# Patient Record
Sex: Male | Born: 1955 | Race: White | Hispanic: No | Marital: Married | Smoking: Former smoker
Health system: Southern US, Community
[De-identification: ages and names within clinical notes are randomized; demographics above are authoritative.]

## PROBLEM LIST (undated history)

## (undated) DIAGNOSIS — E669 Obesity, unspecified: Secondary | ICD-10-CM

## (undated) DIAGNOSIS — G4730 Sleep apnea, unspecified: Secondary | ICD-10-CM

## (undated) DIAGNOSIS — R7989 Other specified abnormal findings of blood chemistry: Secondary | ICD-10-CM

## (undated) DIAGNOSIS — E66811 Obesity, class 1: Secondary | ICD-10-CM

## (undated) DIAGNOSIS — G4733 Obstructive sleep apnea (adult) (pediatric): Secondary | ICD-10-CM

## (undated) DIAGNOSIS — I10 Essential (primary) hypertension: Secondary | ICD-10-CM

## (undated) DIAGNOSIS — K219 Gastro-esophageal reflux disease without esophagitis: Secondary | ICD-10-CM

## (undated) DIAGNOSIS — R7303 Prediabetes: Secondary | ICD-10-CM

## (undated) DIAGNOSIS — M1712 Unilateral primary osteoarthritis, left knee: Secondary | ICD-10-CM

## (undated) DIAGNOSIS — F32A Depression, unspecified: Secondary | ICD-10-CM

## (undated) DIAGNOSIS — C801 Malignant (primary) neoplasm, unspecified: Secondary | ICD-10-CM

## (undated) DIAGNOSIS — Z9989 Dependence on other enabling machines and devices: Secondary | ICD-10-CM

## (undated) HISTORY — DX: Essential (primary) hypertension: I10

## (undated) HISTORY — PX: PROSTATE SURGERY: SHX751

## (undated) HISTORY — DX: Other specified abnormal findings of blood chemistry: R79.89

## (undated) HISTORY — DX: Malignant (primary) neoplasm, unspecified: C80.1

## (undated) HISTORY — PX: TONSILLECTOMY: SUR1361

## (undated) HISTORY — PX: REPLACEMENT TOTAL KNEE: SUR1224

## (undated) HISTORY — DX: Sleep apnea, unspecified: G47.30

## (undated) HISTORY — PX: JOINT REPLACEMENT: SHX530

---

## 2005-06-06 HISTORY — PX: PROSTATECTOMY: SHX69

## 2007-01-24 ENCOUNTER — Ambulatory Visit: Payer: Self-pay | Admitting: Gastroenterology

## 2007-10-23 ENCOUNTER — Ambulatory Visit: Payer: Self-pay | Admitting: Family Medicine

## 2010-03-17 IMAGING — CR DG KNEE COMPLETE 4+V*R*
1 series · 4 of 4 positions shown · non-contrast
Comparison: none

REASON FOR EXAM: pain
COMMENTS:

[Series 1: view not recorded · 0.17mm/px · 4 of 4 slices shown]
[im 1/4]
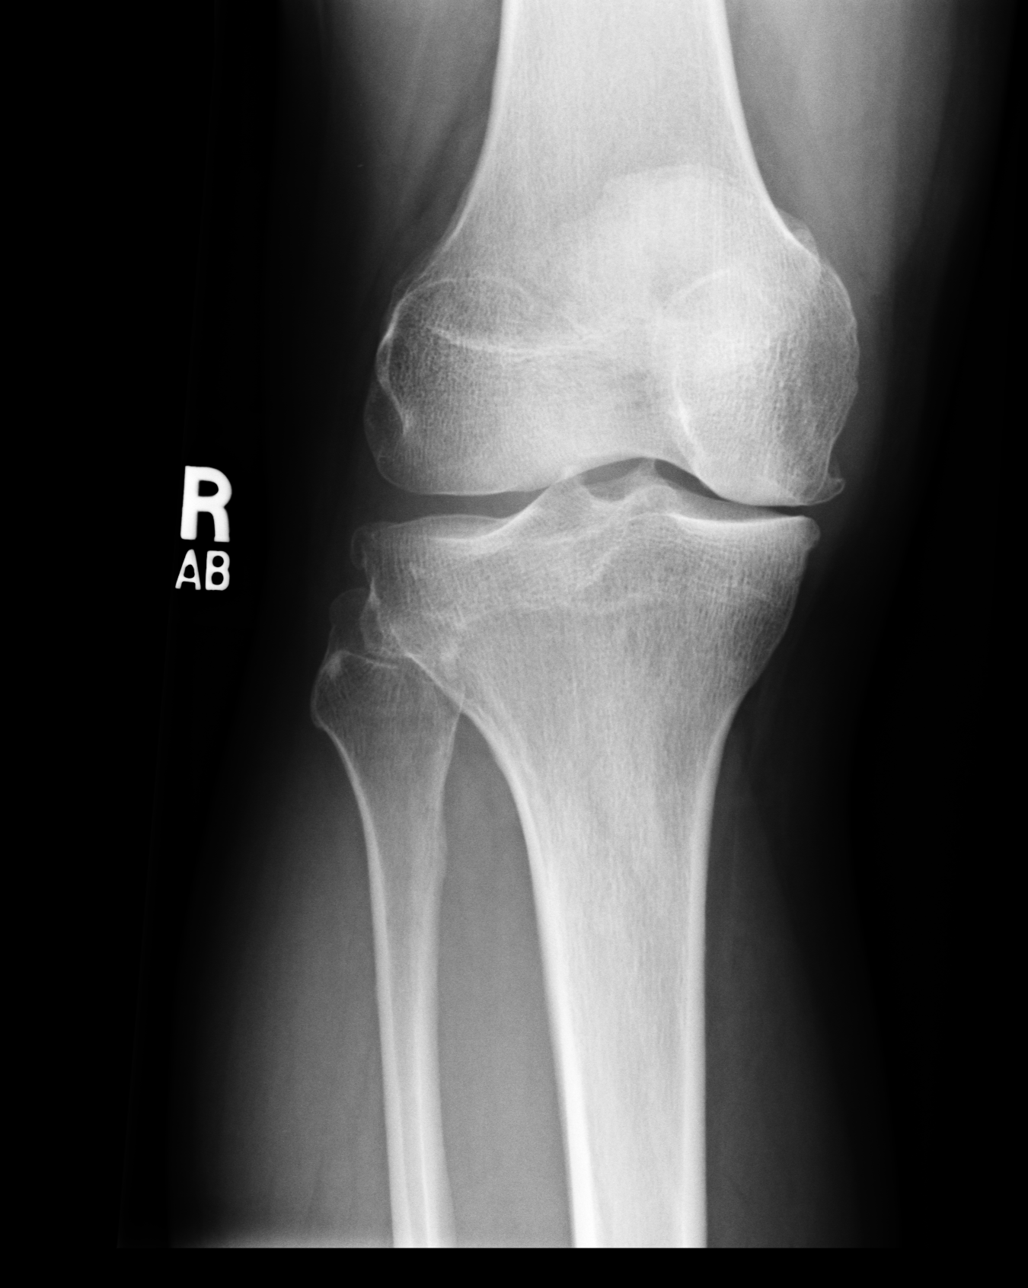
[im 2/4]
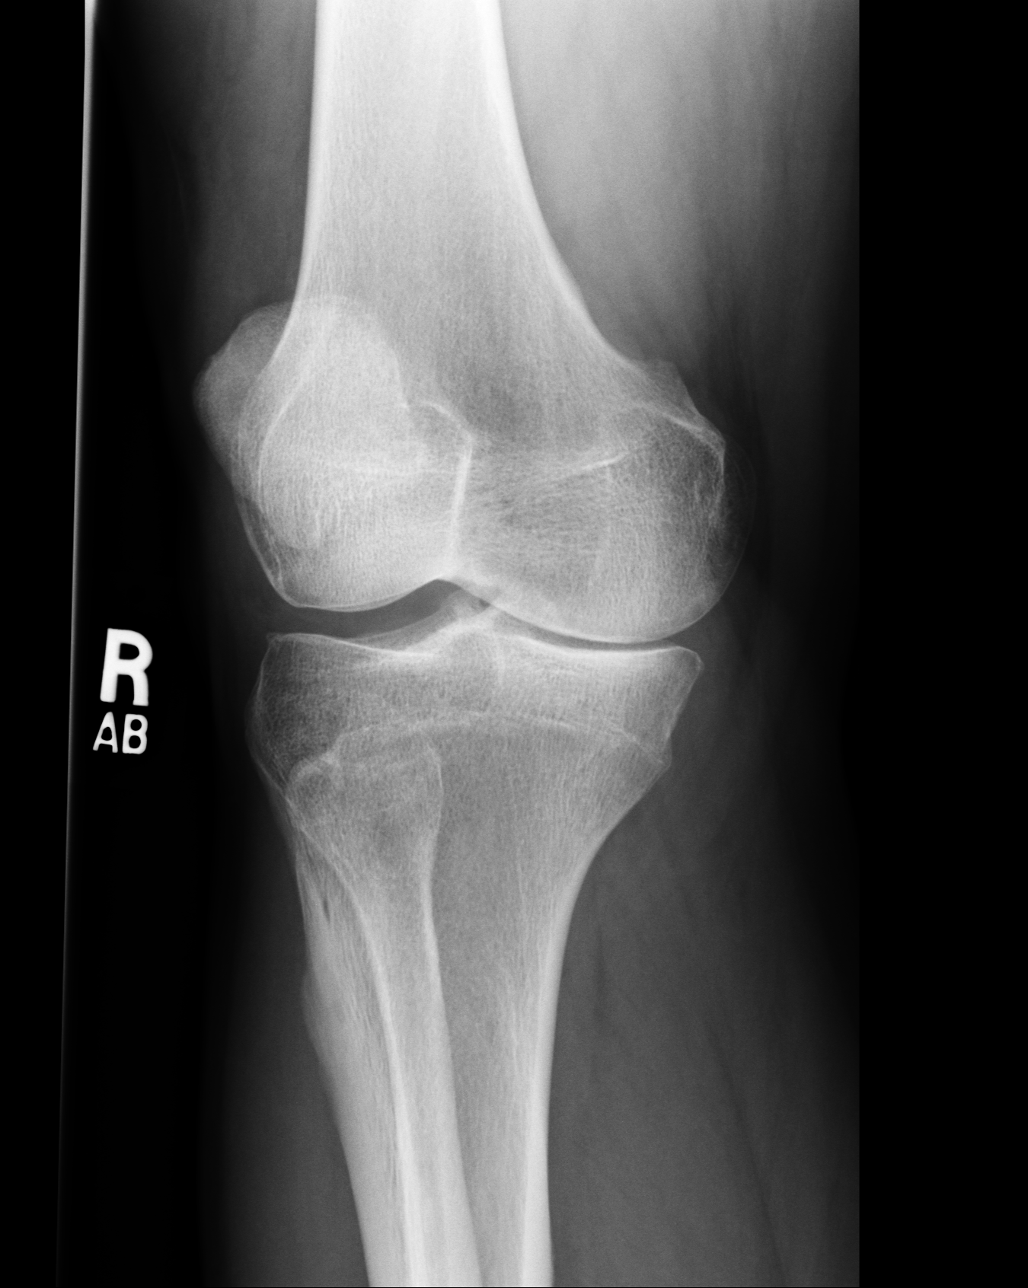
[im 3/4]
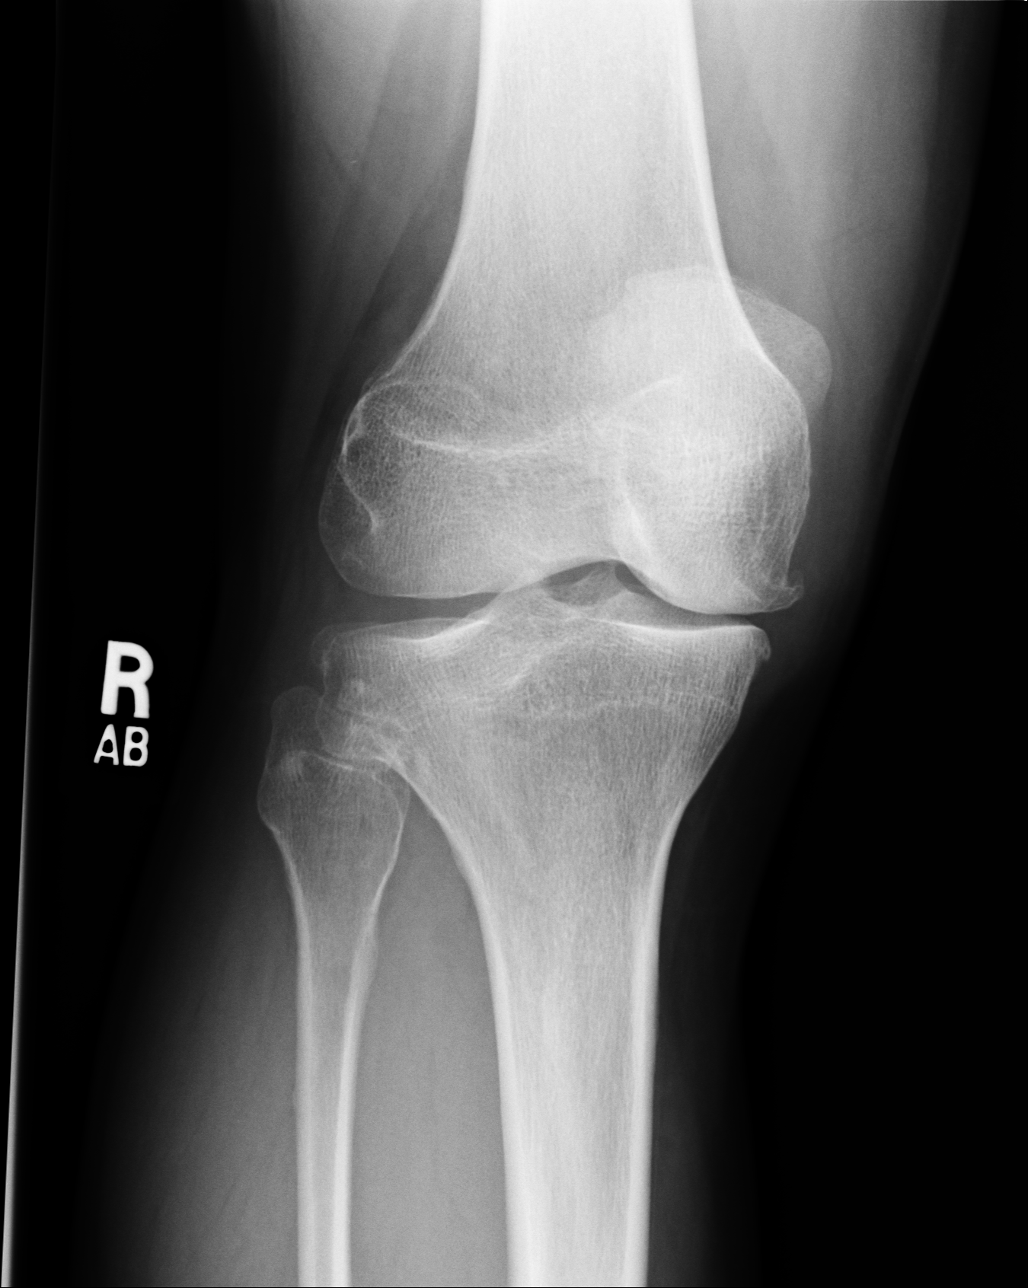
[im 4/4]
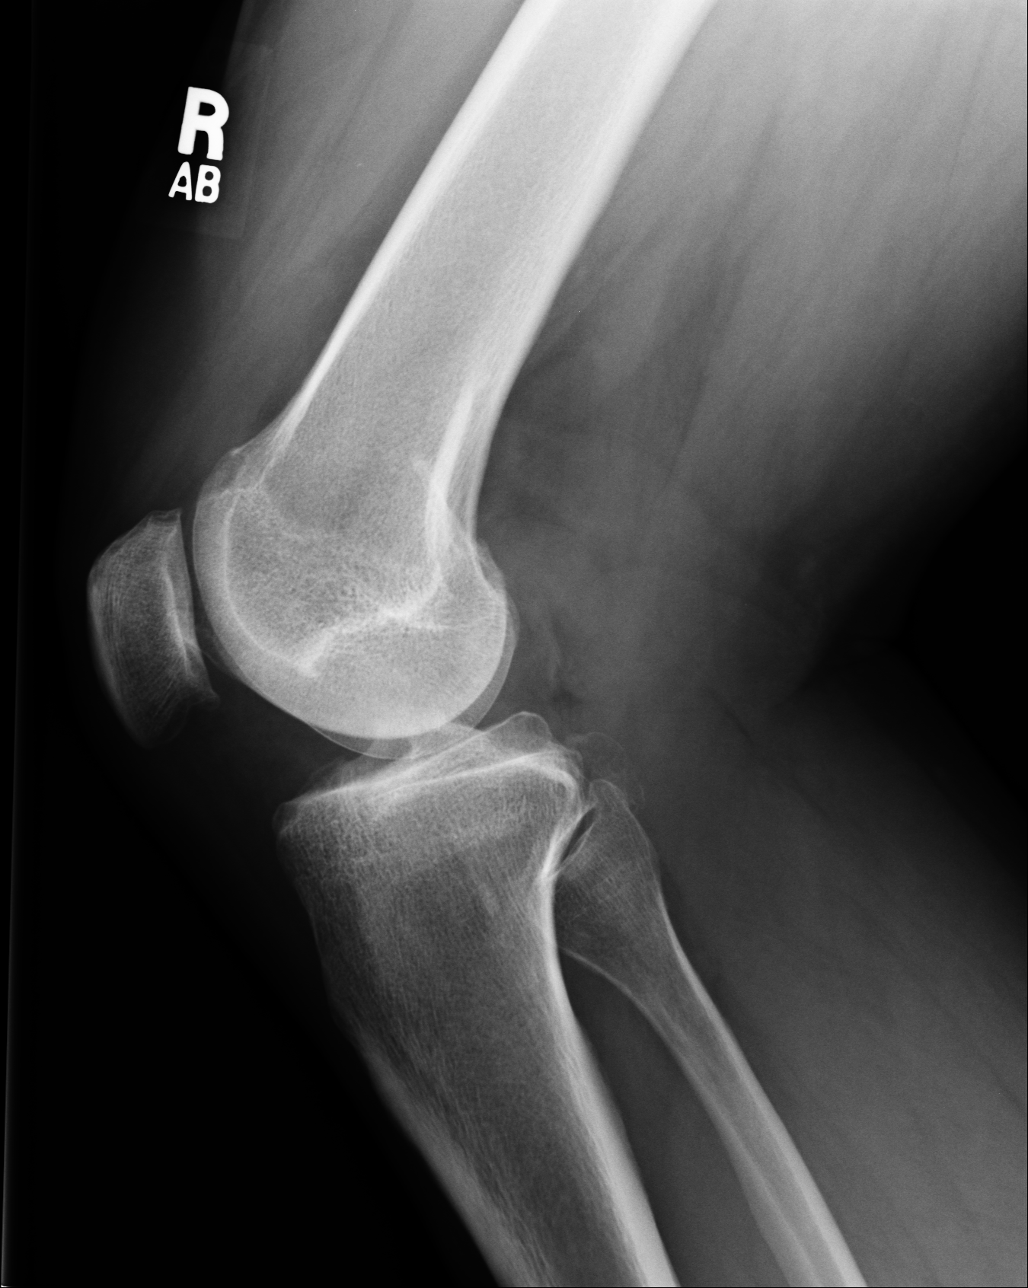

[4 of 4 positions shown; findings below may reference images not displayed]

PROCEDURE:     DXR - DXR KNEE RT COMP WITH OBLIQUES  - October 23, 2007 [DATE]

RESULT:     Images of the RIGHT knee demonstrate joint space narrowing
medially with hypertrophic endplate spurring and some subchondral sclerosis.
There is patellofemoral compartmental narrowing with some mild hypertrophic
marginal spurring. There is no fracture or dislocation. There is no foreign
body.
IMPRESSION: Moderately severe degenerative changes. MRI can be obtained if there is
concern for internal derangement.

## 2010-03-25 ENCOUNTER — Ambulatory Visit: Payer: Self-pay | Admitting: Gastroenterology

## 2013-01-21 ENCOUNTER — Ambulatory Visit: Payer: Self-pay | Admitting: General Practice

## 2013-01-21 LAB — BASIC METABOLIC PANEL
BUN: 18 mg/dL (ref 7–18)
Calcium, Total: 9.2 mg/dL (ref 8.5–10.1)
Chloride: 105 mmol/L (ref 98–107)
Co2: 30 mmol/L (ref 21–32)
Creatinine: 1.07 mg/dL (ref 0.60–1.30)
EGFR (African American): >60
EGFR (Non-African Amer.): >60
Osmolality: 276 (ref 275–301)
Potassium: 4.1 mmol/L (ref 3.5–5.1)
Sodium: 137 mmol/L (ref 136–145)

## 2013-01-21 LAB — CBC
MCV: 86 fL (ref 80–100)
Platelet: 249 10*3/uL (ref 150–440)
RBC: 5.4 10*6/uL (ref 4.40–5.90)
WBC: 7.2 10*3/uL (ref 3.8–10.6)

## 2013-01-21 LAB — URINALYSIS, COMPLETE
Blood: NEGATIVE
Leukocyte Esterase: NEGATIVE
Ph: 5 (ref 4.5–8.0)
RBC,UR: 1 /HPF (ref 0–5)
Specific Gravity: 1.026 (ref 1.003–1.030)
WBC UR: <1 /HPF (ref 0–5)

## 2013-01-21 LAB — MRSA PCR SCREENING

## 2013-01-21 LAB — PROTIME-INR
INR: 1
Prothrombin Time: 13.4 secs (ref 11.5–14.7)

## 2013-01-22 LAB — URINE CULTURE

## 2013-02-06 ENCOUNTER — Inpatient Hospital Stay: Payer: Self-pay | Admitting: General Practice

## 2013-02-06 HISTORY — PX: REPLACEMENT TOTAL KNEE: SUR1224

## 2013-02-07 LAB — BASIC METABOLIC PANEL
Co2: 28 mmol/L (ref 21–32)
Creatinine: 1.12 mg/dL (ref 0.60–1.30)
EGFR (Non-African Amer.): >60
Glucose: 106 mg/dL — ABNORMAL HIGH (ref 65–99)
Osmolality: 274 (ref 275–301)
Sodium: 137 mmol/L (ref 136–145)

## 2013-02-07 LAB — PLATELET COUNT: Platelet: 205 10*3/uL (ref 150–440)

## 2013-02-08 LAB — BASIC METABOLIC PANEL
Anion Gap: 6 — ABNORMAL LOW (ref 7–16)
Calcium, Total: 8.5 mg/dL (ref 8.5–10.1)
Co2: 29 mmol/L (ref 21–32)
Creatinine: 0.95 mg/dL (ref 0.60–1.30)
EGFR (Non-African Amer.): >60
Glucose: 108 mg/dL — ABNORMAL HIGH (ref 65–99)
Potassium: 4 mmol/L (ref 3.5–5.1)
Sodium: 138 mmol/L (ref 136–145)

## 2013-02-08 LAB — PLATELET COUNT: Platelet: 196 10*3/uL (ref 150–440)

## 2014-09-26 NOTE — Op Note (Signed)
PATIENT NAMEBELEN, [name] MR#:  [id number] DATE OF BIRTH:  1955/07/28  DATE OF PROCEDURE:  02/06/2013  PREOPERATIVE DIAGNOSIS: Degenerative arthrosis of the right knee.   POSTOPERATIVE DIAGNOSIS: Degenerative arthrosis of the right knee.   PROCEDURE PERFORMED: Right total knee arthroplasty using computer-assisted navigation.   SURGEON: Laurice Record. Holley Bouche., MD   ASSISTANT:  Vance Peper, PA (required to maintain retraction throughout the procedure).  ANESTHESIA: Spinal.   ESTIMATED BLOOD LOSS: 100 mL.   FLUIDS REPLACED: 3000 mL of crystalloid.   TOURNIQUET TIME: 102 minutes.   DRAINS: Two medium drains to reinfusion system.   SOFT TISSUE RELEASES: ACL, PCL, deep medial collateral ligament and patellofemoral ligament.   IMPLANTS UTILIZED: DePuy PFC Sigma size 5 posterior-stabilized femoral component (cemented), size 5 MBT tibial component (cemented), 38 mm 3-peg oval dome patella (cemented) and a 10 mm stabilized rotating platform polyethylene insert.   INDICATIONS FOR SURGERY: The patient is a 59 year old gentleman who has been seen for complaints of progressive right knee pain. X-rays demonstrated severe degenerative changes in tricompartmental fashion with relative varus alignment. After discussion of the risks and benefits of surgical intervention, the patient expressed understanding of the risks and benefits and agreed with plans for surgical intervention.   PROCEDURE IN DETAIL: The patient was brought into the Operating Room and after adequate spinal anesthesia was achieved, a tourniquet was placed on the patient's upper right thigh. The patient's right knee and leg were cleaned and prepped with alcohol and DuraPrep, draped in the usual sterile fashion. A "timeout" was performed as per usual protocol. The right lower extremity was exsanguinated using an Esmarch and the tourniquet was inflated to 300 mmHg.   An anterior longitudinal incision was made followed by a standard mid  vastus approach. A moderate effusion was evacuated. Inspection of the knee demonstrated severe degenerative changes in a tricompartmental fashion with full-thickness loss of articular cartilage to the medial compartment. Also of note were areas of extensive synovitis. Synovectomy was performed. Prominent osteophytes were debrided using a rongeur. Anterior and posterior cruciate ligaments were excised. Two 4.0 mm Schanz pins were inserted into the femur and into the tibia for attachment of the array of trackers used for computer-assisted navigation. Hip center was identified using circumduction technique. Distal landmarks were mapped using the computer. The distal femur and proximal tibia were mapped using the computer. Distal femoral cutting was positioned using computer-assisted navigation so as to achieve a 5 degree distal valgus cut. Cut was performed and verified using the computer. The distal femur was sized and it was felt that a size 5 femur was appropriate. A size 5 cutting guide was positioned and the anterior cut was performed and verified using the computer. This was followed by completion of the posterior and chamfer cuts. Femoral cutting guide for central box was then positioned and the central box cut was performed.   Attention was then directed to the proximal tibia. Medial and lateral menisci were excised. The extramedullary tibial cutting guide was positioned using computer-assisted navigation so as to achieve 0 degrees varus valgus alignment and 0 degrees posterior slope. Cut was performed and verified using the computer. The proximal tibia was sized and it was felt that a size 5 tibial tray was appropriate. Tibial and femoral trials were inserted followed by insertion of a 10 mm polyethylene trial. Good medial and lateral soft tissue balancing was appreciated both in extension and in flexion. Finally, the patella was cut and prepared so as to accommodate a  38 mm 3-peg oval dome patella. Patellar  trial was placed and the knee was placed through a range of motion with excellent patellar tracking appreciated.   Femoral trial was removed after debridement of posterior osteophytes. The central post hole for the tibial component was reamed followed by insertion of a keel punch. Tibial trial was then removed. The cut surfaces of bone were irrigated with copious amounts of normal saline with antibiotic solution using pulsatile lavage and then suctioned dry. Polymethyl methacrylate cement was prepared in the usual fashion using a vacuum mixer. Cement was applied to the cut surfaces of the proximal tibia as well as along the undersurface of a size 5 MBT tibial component. The tibial component was positioned and impacted into place. Excess cement was removed using [employer] fast streamer. Cement was then applied to the cut surfaces of the femur as well as along the posterior flanges of a size 5 posterior-stabilized femoral component. Femoral component was positioned and impacted into place. A 10 mm polyethylene trial was inserted and the knee was brought into full extension with steady axial compression applied. Finally, cement was applied to the backside of a 38 mm, 3-peg oval dome patella and the patellar component was positioned and patellar clamp applied. Excess cement was removed using [employer] fast streamer.   After adequate curing of the cement, the tourniquet was deflated after a total tourniquet time of 102 minutes. Hemostasis was achieved using electrocautery. The knee was irrigated with copious amounts of normal saline with antibiotic solution using pulsatile lavage and then suctioned dry. The knee was inspected for any residual cement debris. Then 20 mL of 1.3% Exparel in 40 mL of normal saline was injected along the posterior recess as well as the medial and lateral recesses and along the arthrotomy site. A 10 mm stabilized rotating platform polyethylene insert was inserted and the knee was placed through a range of  motion. Excellent mediolateral soft tissue balancing was appreciated both in flexion and in extension. Excellent patellar tracking was appreciated.   Two medium drains were placed in the wound bed and brought out through a separate stab incision to be attached to a reinfusion system. The medial parapatellar portion of the incision was reapproximated using interrupted sutures of #1 Vicryl. The subcutaneous tissue was approximated in layers using first #0 Vicryl followed by #2-0 Vicryl. It should be noted that 30 mL of 0.25% Marcaine with epinephrine was injected along the subcutaneous tissue to either side of the incision site. The skin was then reapproximated using skin staples. Sterile dressing was applied. The patient tolerated the procedure well. He was transported to the recovery room in stable condition.   ____________________________ Laurice Record. Holley Bouche., MD jph:cs D: 02/06/2013 15:16:20 ET T: 02/06/2013 15:52:39 ET JOB#: [id number]  cc: Jeneen Rinks P. Holley Bouche., MD, <Dictator> JAMES P Holley Bouche MD ELECTRONICALLY SIGNED 02/13/2013 7:17

## 2014-09-26 NOTE — Discharge Summary (Signed)
PATIENT NAMEAMEEN, [name] MR#:  [id number] DATE OF BIRTH:  04-Apr-1956  DATE OF ADMISSION:  02/06/2013 DATE OF DISCHARGE:  02/09/2013   DICTATING FOR: Jeneen Rinks P. Holley Bouche., MD  ADMITTING DIAGNOSIS: Degenerative arthrosis of the right knee.   HISTORY: The patient is a pleasant 59 year old gentleman who has been followed at [facility] for progression of right knee pain. He has reported a long history of knee pain. The patient states that the pain had increased to the point that it was significantly interfering with his activities of daily living. The patient also stated that he had noted his pain was noted to be aggravated with weight-bearing activities. He had noted some swelling of the right knee as well. Also reported some near-giving-way. The patient did report that he was experiencing night pain as well. At the time of surgery, he was not using any ambulatory aid. The patient had not seen any significant improvement in his condition despite conservative care of anti-inflammatory medications, activity modification. X-rays taken in [facility] showed narrowing of the medial cartilage space with bone-on-bone articulation noted as well as being in a slight varus alignment. Osteophyte as well as subchondral sclerosis was noted. After discussion of the risks and benefits of surgical intervention, the patient expressed his understanding of the risks and benefits and agreed for plans for surgical intervention.   PROCEDURE: Right total knee arthroplasty using computer-assisted navigation.   ANESTHESIA: Spinal.   SOFT TISSUE RELEASE: Anterior cruciate ligament, posterior cruciate ligament, deep medial collateral ligaments as well as the patellofemoral ligament.   IMPLANTS UTILIZED: DePuy PFC Sigma size 5 posterior stabilized femoral component (cemented), size 5 MBT tibial component (cemented), 38 mm 3-pegged oval dome patella (cemented), and a 10 mm stabilized rotating platform polyethylene  insert.   HOSPITAL COURSE: The patient tolerated the procedure very well. He had no complications. He was then taken to the PACU, where he was stabilized and then transferred to the orthopedic floor. The patient began receiving anticoagulation therapy of Lovenox 30 mg subcutaneous q.12 hours per anesthesia and pharmacy protocol. He was fitted with TED stockings bilaterally. These were allowed to be removed 1 hour per 8-hour shift. The right one was applied on day 2 following removal of the Hemovac and dressing change. The patient was also fitted with AVI compression foot pumps bilaterally set at 80 mmHg. His calves have been nontender. There has been no evidence of any DVTs. Negative Homans sign. Heels were elevated off the bed using rolled towels.   The patient has denied any chest pain or any shortness of breath. Vital signs have been stable. He has been afebrile. Hemodynamically, he was stable. No transfusions were given other than the Autovac transfusion given the first 6 hours postoperatively.   Physical therapy was initiated on day 1 for gait training and transfers. He has done very well. Upon being discharged, he was ambulating greater than 200 feet. He was independent with bed-to-chair transfers. He was able to go up 4 steps. Occupational therapy was also initiated on day 1 for ADLs and assistive devices. He has had no complications.   The patient's IV, Foley and Hemovac were discontinued on day 2 along with a dressing change. The wound was free of any drainage or signs of infection. Polar Care was reapplied to the surgical leg, maintaining a temperature of 40 to 50 degrees Fahrenheit.   DISPOSITION: The patient is being discharged to home in improved stable condition.   DISCHARGE INSTRUCTIONS: 1. May continue to  weight bear as tolerated.  2. Elevate the right leg when sitting.  3. Continue with TED stockings. These are to be removed at night, but are to be worn during the day.  4. Incentive  spirometer q.1 hour while awake.  5. Encourage cough and deep breathing q.2 hours while awake.  6. He is placed on a regular diet.  7. Polar Care to the surgical leg, maintaining a temperature of 40 to 50 degrees Fahrenheit. Recommend that he wear this around the clock for the first 2 weeks.  8. He is not to get the dressing wet. He is not to take a shower until the staples are removed at 2 weeks.  9. He has a followup appointment in [facility] on September 18th at 8:30.  10. He is to call the clinic sooner if any temperatures of 101.5 or greater or excessive bleeding. 11. He is to continue using a walker until cleared by physical therapy to go to a quad cane. 12. He will receive home health PT.   DISCHARGE MEDICATIONS: He is to resume his regular medications that he was on prior to admission. He was given a prescription for oxycodone 5 to 10 mg q.4-6 hours p.r.n. for pain, Ultram 50 to 100 mg q.4-6 hours p.r.n. for pain and Lovenox 40 mg subcutaneously daily for 14 days, then discontinue and begin taking one 81 mg enteric-coated aspirin. He may resume his naproxen and ibuprofen once he has finished the Lovenox.   PAST MEDICAL HISTORY:  1. Hypertension.  2. Prostate cancer diagnosed in 2007.  3. Sleep apnea. 4. Depression.  ____________________________ Vance Peper, PA jrw:OSi D: 02/08/2013 07:51:00 ET T: 02/08/2013 08:45:12 ET JOB#: [id number]  cc: Vance Peper, PA, <Dictator> Monick Rena PA ELECTRONICALLY SIGNED 02/12/2013 8:21

## 2014-11-25 IMAGING — CR DG KNEE 1-2V*R*
1 series · 2 of 2 positions shown · non-contrast
Comparison: none

REASON FOR EXAM: postop
COMMENTS:   Bedside (portable):Y

PROCEDURE:     DXR - DXR KNEE RIGHT AP AND LATERAL  - February 06, 2013 [DATE]
RESULT:     History: Post-op TKR

[Series 1: ap · 0.17mm/px · 2 of 2 slices shown]
[im 1/2]
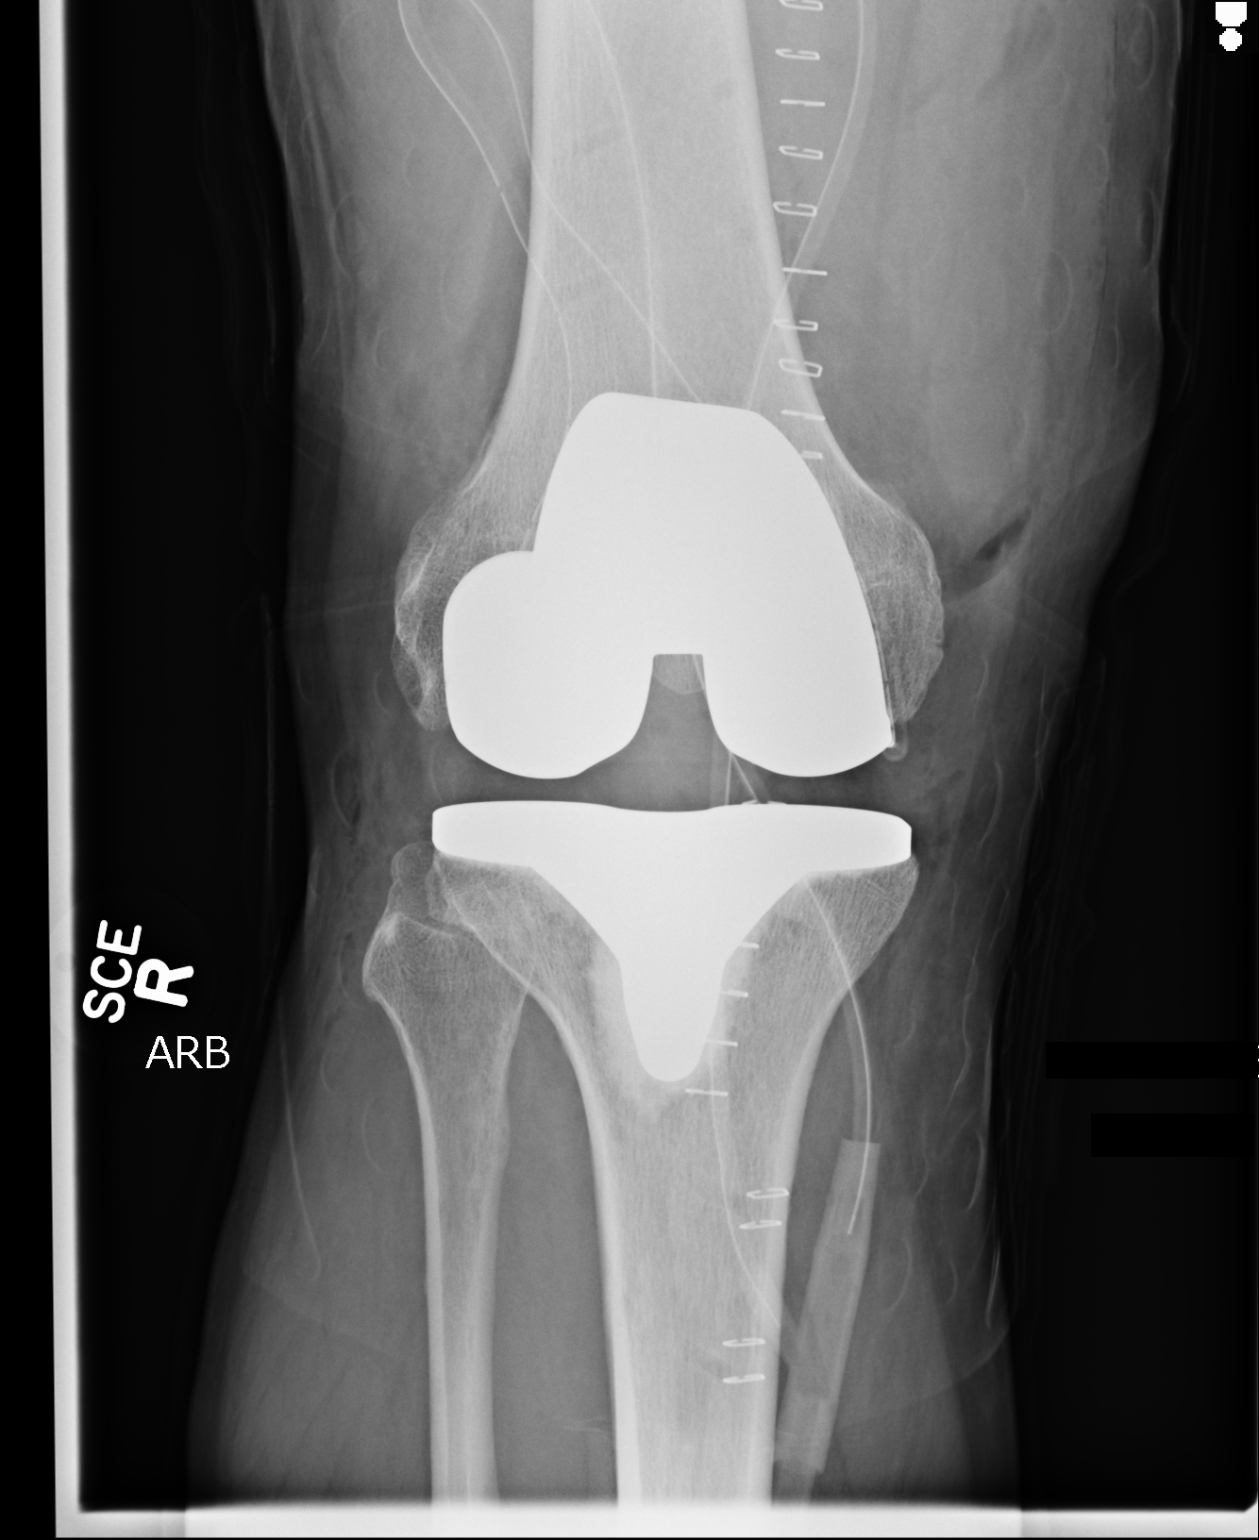
[im 2/2]
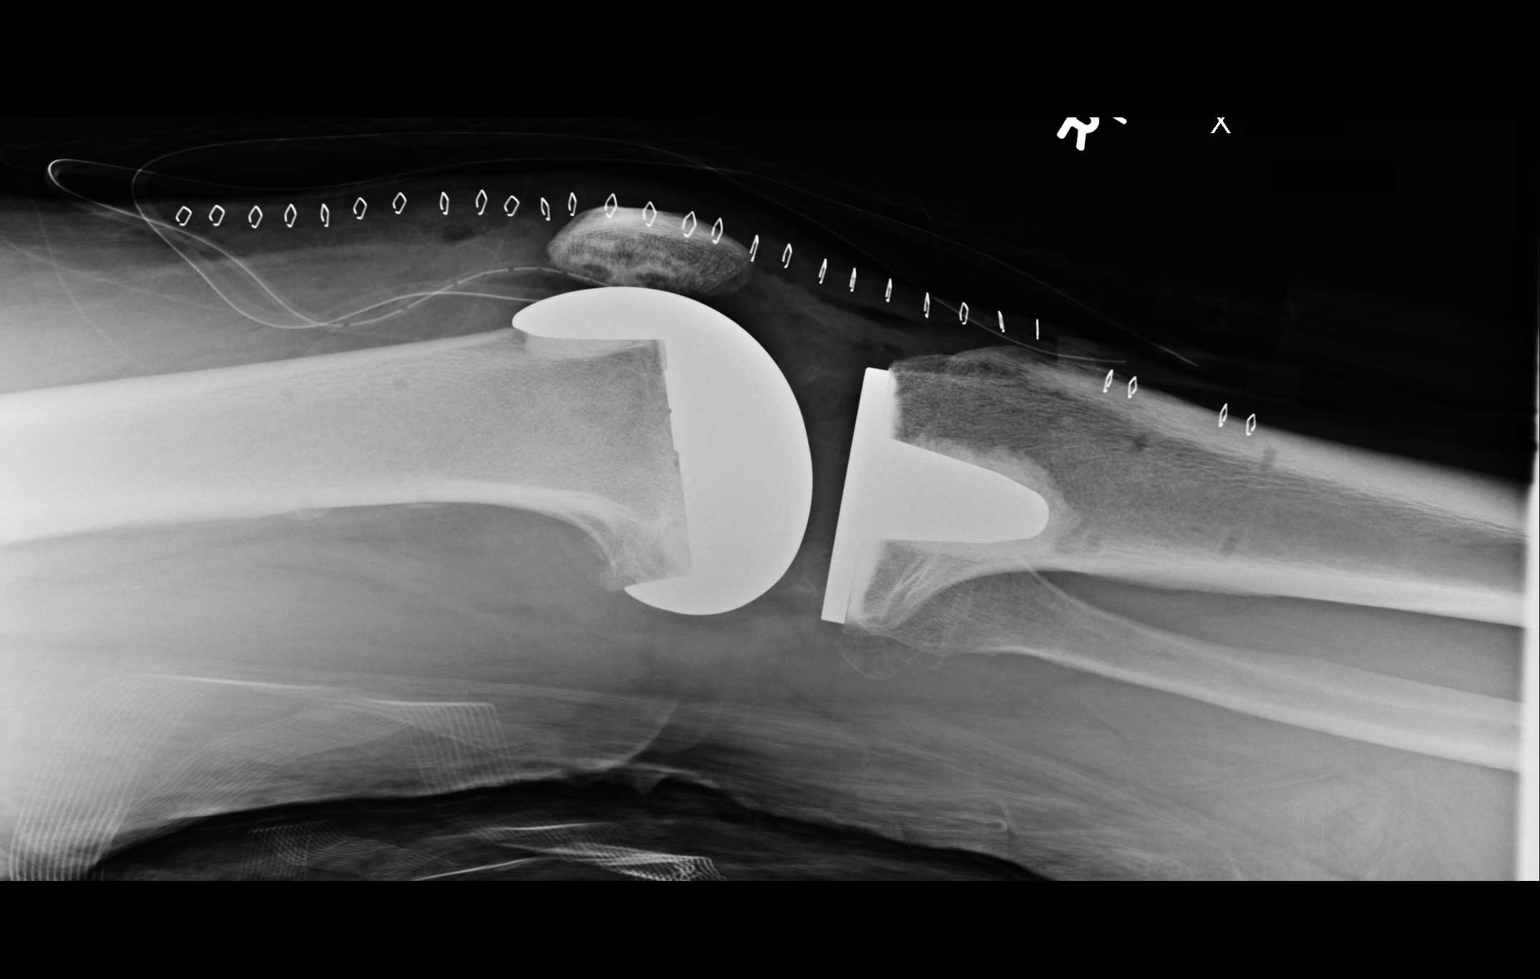

[2 of 2 positions shown; findings below may reference images not displayed]

FINDINGS: AP and lateral views of the right knee demonstrates a total right knee
arthroplasty without evidence of hardware failure complication. There is no
significant joint effusion. There is no fracture or dislocation. The
alignment is anatomic.
IMPRESSION: Right total knee arthroplasty.

[REDACTED]

## 2016-07-05 DIAGNOSIS — I10 Essential (primary) hypertension: Secondary | ICD-10-CM | POA: Diagnosis not present

## 2016-07-05 DIAGNOSIS — J209 Acute bronchitis, unspecified: Secondary | ICD-10-CM | POA: Diagnosis not present

## 2016-07-05 DIAGNOSIS — R05 Cough: Secondary | ICD-10-CM | POA: Diagnosis not present

## 2016-07-07 DIAGNOSIS — Z0000 Encounter for general adult medical examination without abnormal findings: Secondary | ICD-10-CM | POA: Diagnosis not present

## 2016-07-07 DIAGNOSIS — M5431 Sciatica, right side: Secondary | ICD-10-CM | POA: Diagnosis not present

## 2017-04-21 DIAGNOSIS — Z23 Encounter for immunization: Secondary | ICD-10-CM | POA: Diagnosis not present

## 2017-05-18 ENCOUNTER — Encounter: Payer: Self-pay | Admitting: Nurse Practitioner

## 2017-05-18 ENCOUNTER — Ambulatory Visit (INDEPENDENT_AMBULATORY_CARE_PROVIDER_SITE_OTHER): Payer: 59 | Admitting: Nurse Practitioner

## 2017-05-18 VITALS — BP 140/80 | HR 79 | Resp 16 | Ht 74.0 in | Wt 284.0 lb

## 2017-05-18 DIAGNOSIS — I10 Essential (primary) hypertension: Secondary | ICD-10-CM

## 2017-05-18 DIAGNOSIS — F339 Major depressive disorder, recurrent, unspecified: Secondary | ICD-10-CM | POA: Diagnosis not present

## 2017-05-18 DIAGNOSIS — E668 Other obesity: Secondary | ICD-10-CM

## 2017-05-18 MED ORDER — PAROXETINE HCL 20 MG PO TABS: 20.0000 mg | ORAL_TABLET | Freq: Every day | ORAL | 1 refills | Status: DC

## 2017-05-18 NOTE — Progress Notes (Signed)
Subjective:     Patient ID: [name], male   DOB: Dec 29, 1955, 61 y.o.   MRN: [id number]  The patient is concerned about persistent/worsening depression. Has been on Cymbalta 30mg  twice daily for some time. Doesn't feel like it's doing too much to help his depression. Having trouble forcing himself to get out of bed. Lacking enthusiasm in life. Denies suicidality or homicidality. Just feels "blah" all the time. He states that some time ago he was on paxil. Remembers that he did very well on it. Did have some sexual side effects, but that is not so important at this point.      Review of Systems  Constitutional: Positive for fatigue.  HENT: Positive for postnasal drip.   Eyes: Negative.   Respiratory: Positive for cough.   Cardiovascular: Negative.   Gastrointestinal: Negative.   Endocrine: Negative.   Genitourinary: Negative.   Musculoskeletal: Negative.   Allergic/Immunologic: Negative.   Hematological: Negative.   Psychiatric/Behavioral: Negative for suicidal ideas.       Depression       Objective:   Physical Exam  Constitutional: He is oriented to person, place, and time. He appears well-developed and well-nourished.  HENT:  Head: Normocephalic and atraumatic.  Neck: Normal range of motion.  Cardiovascular: Normal rate and regular rhythm.  Pulmonary/Chest: Effort normal and breath sounds normal.  Abdominal: Soft. There is no tenderness.  Neurological: He is alert and oriented to person, place, and time.  Skin: Skin is warm and dry.  Psychiatric: His speech is normal and behavior is normal. Judgment and thought content normal. Cognition and memory are normal. He exhibits a depressed mood.       Assessment:     Depression, recurrent ([place]) - Plan: PARoxetine (PAXIL) 20 MG tablet  Essential hypertension   Plan:     1. Recurrent depression - start paroxetine 20mg  daily. Discussed weaing, slowly, down to cymbalta 30mg  daily from twice daily. Reassess in 30 days.  Will likely increase paroxetine and continue to wean off cymbalta.  2. Hypertension - continue bp medication. 3. Discussed limiting calorie in take to 1500 calories and gradually increasing regular exercise.   Follow up 1 month*

## 2017-06-15 ENCOUNTER — Ambulatory Visit: Payer: 59 | Admitting: Nurse Practitioner

## 2017-06-15 ENCOUNTER — Encounter: Payer: Self-pay | Admitting: Nurse Practitioner

## 2017-06-15 ENCOUNTER — Other Ambulatory Visit: Payer: Self-pay

## 2017-06-15 VITALS — BP 130/82 | HR 85 | Resp 16 | Ht 74.0 in | Wt 282.2 lb

## 2017-06-15 DIAGNOSIS — J209 Acute bronchitis, unspecified: Secondary | ICD-10-CM | POA: Insufficient documentation

## 2017-06-15 DIAGNOSIS — I10 Essential (primary) hypertension: Secondary | ICD-10-CM | POA: Insufficient documentation

## 2017-06-15 DIAGNOSIS — F339 Major depressive disorder, recurrent, unspecified: Secondary | ICD-10-CM | POA: Diagnosis not present

## 2017-06-15 DIAGNOSIS — R05 Cough: Secondary | ICD-10-CM | POA: Insufficient documentation

## 2017-06-15 DIAGNOSIS — R059 Cough, unspecified: Secondary | ICD-10-CM | POA: Insufficient documentation

## 2017-06-15 MED ORDER — PAROXETINE HCL 40 MG PO TABS: 40.0000 mg | ORAL_TABLET | ORAL | 3 refills | Status: DC

## 2017-06-15 NOTE — Progress Notes (Signed)
[facility] [place], [place] [address]  Internal MEDICINE  Office Visit Note  Patient Name: [name]  [id number]  [id number]  Date of Service: 06/15/2017     Complaints/HPI Pt is here for routine follow up.  The patient is here for follow up of depression. He gradually weaned off his cymbalta. And, starting this Monday, He is taking 40mg  of paxil I nthe mornings. States that he is feeling some better. Has a little more energy and does not feel as groggy in the mornings.  He does need to have new written rx for Dtap. Daughter-in-law is due to have a baby in February. She would like for the patient to have this before the baby is born.     Current Medication: Outpatient Encounter Medications as of 06/15/2017  Medication Sig Note  . amLODipine-benazepril (LOTREL) 5-20 MG capsule Take 1 capsule by mouth daily.   Marland Kitchen aspirin 81 MG chewable tablet Chew 81 mg by mouth daily.   . fluticasone (FLONASE) 50 MCG/ACT nasal spray Place 1 spray into both nostrils daily.   . [DISCONTINUED] PARoxetine (PAXIL) 20 MG tablet Take 1 tablet (20 mg total) by mouth daily. 06/15/2017: increased dose  . albuterol (PROAIR HFA) 108 (90 Base) MCG/ACT inhaler Inhale 1 puff into the lungs 4 (four) times daily.   Marland Kitchen FLUARIX QUADRIVALENT 0.5 ML injection inject 0.5 milliliter intramuscularly   . PARoxetine (PAXIL) 40 MG tablet Take 1 tablet (40 mg total) by mouth every morning.   . [DISCONTINUED] DULoxetine (CYMBALTA) 30 MG capsule Take 30 mg by mouth 2 (two) times daily. Once in morning and once at night 06/15/2017: changed to paxil 40mg  daily   No facility-administered encounter medications on file as of 06/15/2017.     Surgical History: Past Surgical History:  Procedure Laterality Date  . PROSTATE SURGERY    . REPLACEMENT TOTAL KNEE Right   . TONSILLECTOMY Bilateral     Medical History: Past Medical History:  Diagnosis Date  . Cancer [facility])    prostate and skin  .  Hypertension   . Low testosterone   . Sleep apnea     Family History: Family History  Problem Relation Age of Onset  . ALS Mother   . Cancer Father   . Cancer Paternal Grandfather     Social History   Socioeconomic History  . Marital status: Married    Spouse name: Not on file  . Number of children: Not on file  . Years of education: Not on file  . Highest education level: Not on file  Social Needs  . Financial resource strain: Not on file  . Food insecurity - worry: Not on file  . Food insecurity - inability: Not on file  . Transportation needs - medical: Not on file  . Transportation needs - non-medical: Not on file  Occupational History  . Not on file  Tobacco Use  . Smoking status: Former Research scientist (life sciences)  . Smokeless tobacco: Never Used  Substance and Sexual Activity  . Alcohol use: Yes    Comment: social  . Drug use: No  . Sexual activity: Not on file  Other Topics Concern  . Not on file  Social History Narrative  . Not on file      Review of Systems  Constitutional: Positive for fatigue.       Improving fatigue  HENT: Negative for ear pain, postnasal drip and sinus pressure.   Eyes: Negative.   Respiratory: Negative for chest tightness, shortness  of breath and wheezing.   Cardiovascular: Negative for chest pain and palpitations.  Gastrointestinal: Negative for constipation, diarrhea, nausea and vomiting.  Endocrine: Negative.   Genitourinary: Negative.   Musculoskeletal: Negative for arthralgias, back pain, joint swelling and myalgias.  Skin: Negative.   Neurological: Negative for tremors, seizures, weakness and headaches.  Psychiatric/Behavioral: Positive for sleep disturbance. The patient is nervous/anxious.        Depression. This has imrpoved since changing duloxetine to paxil. Started 40mg  dosing of paxil on Monday.     Today's Vitals   06/15/17 0923  BP: 130/82  Pulse: 85  Resp: 16  SpO2: 98%  Weight: 282 lb 3.2 oz (128 kg)  Height: 6\' 2"  (1.88  m)     Physical Exam  Constitutional: He is oriented to person, place, and time. He appears well-developed and well-nourished.  HENT:  Head: Normocephalic.  Eyes: Pupils are equal, round, and reactive to light.  Neck: Normal range of motion. Neck supple.  Cardiovascular: Normal rate and regular rhythm.  Pulmonary/Chest: Effort normal and breath sounds normal.  Abdominal: Soft. There is no tenderness.  Musculoskeletal: Normal range of motion.  Neurological: He is alert and oriented to person, place, and time.  Skin: Skin is warm and dry.  Psychiatric: He has a normal mood and affect.  Nursing note and vitals reviewed.   Assessment/Plan:    ICD-10-CM   1. Depression, recurrent (HCC) F33.9 PARoxetine (PAXIL) 40 MG tablet  2. Essential (primary) hypertension I10     1. Slightly improved depression. Now off cymbalta cmpletely. Increase Paxil to 40mg  daily. New rx provided today.  2. bp stable. Continue bp medicatoin as prescribed.   Follow up 3 months and sooner if needed. Routine, fasting labs to be drawn prior to visit.   General Counseling: I have discussed the findings of the evaluation and examination with [name].  I have also discussed any further diagnostic evaluation that may be needed or ordered today. [name] verbalizes understanding of the findings of todays visit. We also reviewed his medications today. he has been encouraged to call the office with any questions or concerns that should arise related to todays visit.  This patient was seen by Leretha Pol, FNP- C in Collaboration with Dr Lavera Guise as a part of collaborative care agreement   Time spent:15 minutes    Dr Lavera Guise Internal medicine

## 2017-07-26 ENCOUNTER — Other Ambulatory Visit: Payer: Self-pay

## 2017-08-11 ENCOUNTER — Other Ambulatory Visit: Payer: Self-pay

## 2017-08-11 DIAGNOSIS — F339 Major depressive disorder, recurrent, unspecified: Secondary | ICD-10-CM

## 2017-08-11 MED ORDER — PAROXETINE HCL 40 MG PO TABS: 40.0000 mg | ORAL_TABLET | ORAL | 1 refills | Status: DC

## 2017-09-04 ENCOUNTER — Other Ambulatory Visit: Payer: Self-pay

## 2017-09-04 DIAGNOSIS — F339 Major depressive disorder, recurrent, unspecified: Secondary | ICD-10-CM

## 2017-09-04 MED ORDER — PAROXETINE HCL 40 MG PO TABS: 40.0000 mg | ORAL_TABLET | ORAL | 1 refills | Status: DC

## 2017-09-14 ENCOUNTER — Encounter: Payer: Self-pay | Admitting: Nurse Practitioner

## 2017-09-14 ENCOUNTER — Ambulatory Visit: Payer: 59 | Admitting: Nurse Practitioner

## 2017-09-14 ENCOUNTER — Other Ambulatory Visit: Payer: Self-pay | Admitting: Internal Medicine

## 2017-09-14 VITALS — BP 132/79 | HR 82 | Resp 16 | Ht 74.0 in | Wt 286.2 lb

## 2017-09-14 DIAGNOSIS — I10 Essential (primary) hypertension: Secondary | ICD-10-CM

## 2017-09-14 DIAGNOSIS — E668 Other obesity: Secondary | ICD-10-CM | POA: Diagnosis not present

## 2017-09-14 DIAGNOSIS — F339 Major depressive disorder, recurrent, unspecified: Secondary | ICD-10-CM

## 2017-09-14 MED ORDER — AMLODIPINE BESY-BENAZEPRIL HCL 5-20 MG PO CAPS: 1.0000 | ORAL_CAPSULE | Freq: Every day | ORAL | 2 refills | Status: DC

## 2017-09-14 NOTE — Progress Notes (Signed)
[facility] [place], [place] [address]  Internal MEDICINE  Office Visit Note  Patient Name: [name]  [id number]  [id number]  Date of Service: 10/04/2017   Pt is here for routine follow up.    Chief Complaint  Patient presents with  . Depression    follow up - doing very well on current dose of paxil    The patient is here for routine follow up exam. The patient is currently taking paxil and doing much better. Recently got 90 day prescriptions. He reports more energy, He is less irritable and "snappy" with his wife. Enjoying life more than at prior visit.      Current Medication: Outpatient Encounter Medications as of 09/14/2017  Medication Sig  . aspirin 81 MG chewable tablet Chew 81 mg by mouth daily.  Marland Kitchen PARoxetine (PAXIL) 40 MG tablet Take 1 tablet (40 mg total) by mouth every morning.  . [DISCONTINUED] amLODipine-benazepril (LOTREL) 5-20 MG capsule Take 1 capsule by mouth daily.  Marland Kitchen albuterol (PROAIR HFA) 108 (90 Base) MCG/ACT inhaler Inhale 1 puff into the lungs 4 (four) times daily.  Marland Kitchen FLUARIX QUADRIVALENT 0.5 ML injection inject 0.5 milliliter intramuscularly  . fluticasone (FLONASE) 50 MCG/ACT nasal spray Place 1 spray into both nostrils daily.   No facility-administered encounter medications on file as of 09/14/2017.     Surgical History: Past Surgical History:  Procedure Laterality Date  . PROSTATE SURGERY    . REPLACEMENT TOTAL KNEE Right   . TONSILLECTOMY Bilateral     Medical History: Past Medical History:  Diagnosis Date  . Cancer [facility])    prostate and skin  . Hypertension   . Low testosterone   . Sleep apnea     Family History: Family History  Problem Relation Age of Onset  . ALS Mother   . Cancer Father   . Cancer Paternal Grandfather     Social History   Socioeconomic History  . Marital status: Married    Spouse name: Not on file  . Number of children: Not on file  . Years of education: Not on file  .  Highest education level: Not on file  Occupational History  . Not on file  Social Needs  . Financial resource strain: Not on file  . Food insecurity:    Worry: Not on file    Inability: Not on file  . Transportation needs:    Medical: Not on file    Non-medical: Not on file  Tobacco Use  . Smoking status: Former Research scientist (life sciences)  . Smokeless tobacco: Never Used  Substance and Sexual Activity  . Alcohol use: Yes    Comment: social  . Drug use: No  . Sexual activity: Not on file  Lifestyle  . Physical activity:    Days per week: Not on file    Minutes per session: Not on file  . Stress: Not on file  Relationships  . Social connections:    Talks on phone: Not on file    Gets together: Not on file    Attends religious service: Not on file    Active member of club or organization: Not on file    Attends meetings of clubs or organizations: Not on file    Relationship status: Not on file  . Intimate partner violence:    Fear of current or ex partner: Not on file    Emotionally abused: Not on file    Physically abused: Not on file    Forced  sexual activity: Not on file  Other Topics Concern  . Not on file  Social History Narrative  . Not on file      Review of Systems  Constitutional: Positive for fatigue. Negative for activity change and unexpected weight change.       Improving fatigue  HENT: Negative for ear pain, postnasal drip and sinus pressure.   Eyes: Negative.   Respiratory: Negative for chest tightness, shortness of breath and wheezing.   Cardiovascular: Negative for chest pain and palpitations.  Gastrointestinal: Negative for constipation, diarrhea, nausea and vomiting.  Endocrine: Negative for cold intolerance, heat intolerance, polydipsia, polyphagia and polyuria.  Genitourinary: Negative.   Musculoskeletal: Negative for arthralgias, back pain, joint swelling and myalgias.  Skin: Negative for rash.  Allergic/Immunologic: Negative for environmental allergies.   Neurological: Negative for tremors, seizures, weakness and headaches.  Hematological: Negative for adenopathy.  Psychiatric/Behavioral: Positive for sleep disturbance. The patient is nervous/anxious.        Depression. This has imrpoved since changing duloxetine to paxil. Started 40mg  dosing of paxil on Monday.     Today's Vitals   09/14/17 1025  BP: 132/79  Pulse: 82  Resp: 16  SpO2: 97%  Weight: 286 lb 3.2 oz (129.8 kg)  Height: 6\' 2"  (1.88 m)    Physical Exam  Constitutional: He is oriented to person, place, and time. He appears well-developed and well-nourished.  HENT:  Head: Normocephalic and atraumatic.  Eyes: Pupils are equal, round, and reactive to light. EOM are normal.  Neck: Normal range of motion. Neck supple. No JVD present. Carotid bruit is not present. No thyromegaly present.  Cardiovascular: Normal rate, regular rhythm and normal heart sounds.  Pulmonary/Chest: Effort normal and breath sounds normal. He has no wheezes.  Abdominal: Soft. Bowel sounds are normal. There is no tenderness.  Musculoskeletal: Normal range of motion.  Neurological: He is alert and oriented to person, place, and time.  Skin: Skin is warm and dry.  Psychiatric: He has a normal mood and affect. His behavior is normal. Judgment and thought content normal.  Nursing note and vitals reviewed.   Assessment/Plan: 1. Essential hypertension Stable. Continue bp medications as prescribed .  2. Depression, recurrent ([place]) Doing much better. Continue paxil as prescribed.   3. Moderate obesity Will continue to work on diet and lifestyl changes to lose weight.   General Counseling: [name] understanding of the findings of todays visit and agrees with plan of treatment. I have discussed any further diagnostic evaluation that may be needed or ordered today. We also reviewed his medications today. he has been encouraged to call the office with any questions or concerns that should arise  related to todays visit.   This patient was seen by Leretha Pol, FNP- C in Collaboration with Dr Lavera Guise as a part of collaborative care agreement   Time spent: 48 Minutes     Dr Lavera Guise Internal medicine

## 2017-09-25 ENCOUNTER — Other Ambulatory Visit: Payer: Self-pay

## 2017-09-25 MED ORDER — AMLODIPINE BESY-BENAZEPRIL HCL 5-20 MG PO CAPS: 1.0000 | ORAL_CAPSULE | Freq: Every day | ORAL | 2 refills | Status: DC

## 2017-10-04 DIAGNOSIS — F339 Major depressive disorder, recurrent, unspecified: Secondary | ICD-10-CM | POA: Insufficient documentation

## 2017-10-04 DIAGNOSIS — E668 Other obesity: Secondary | ICD-10-CM | POA: Insufficient documentation

## 2018-01-25 ENCOUNTER — Other Ambulatory Visit: Payer: Self-pay | Admitting: Nurse Practitioner

## 2018-01-25 DIAGNOSIS — E782 Mixed hyperlipidemia: Secondary | ICD-10-CM | POA: Diagnosis not present

## 2018-01-25 DIAGNOSIS — Z0001 Encounter for general adult medical examination with abnormal findings: Secondary | ICD-10-CM | POA: Diagnosis not present

## 2018-01-25 DIAGNOSIS — I10 Essential (primary) hypertension: Secondary | ICD-10-CM | POA: Diagnosis not present

## 2018-01-25 DIAGNOSIS — Z125 Encounter for screening for malignant neoplasm of prostate: Secondary | ICD-10-CM | POA: Diagnosis not present

## 2018-01-26 LAB — COMPREHENSIVE METABOLIC PANEL
A/G RATIO: 1.9 (ref 1.2–2.2)
ALT: 18 IU/L (ref 0–44)
AST: 20 IU/L (ref 0–40)
Albumin: 4.3 g/dL (ref 3.6–4.8)
Alkaline Phosphatase: 80 IU/L (ref 39–117)
BILIRUBIN TOTAL: 0.6 mg/dL (ref 0.0–1.2)
BUN/Creatinine Ratio: 14 (ref 10–24)
BUN: 16 mg/dL (ref 8–27)
CHLORIDE: 104 mmol/L (ref 96–106)
CO2: 24 mmol/L (ref 20–29)
Calcium: 9.4 mg/dL (ref 8.6–10.2)
Creatinine, Ser: 1.12 mg/dL (ref 0.76–1.27)
GFR calc non Af Amer: 70 mL/min/{1.73_m2} (ref >59)
GFR, EST AFRICAN AMERICAN: 81 mL/min/{1.73_m2} (ref >59)
Globulin, Total: 2.3 g/dL (ref 1.5–4.5)
Glucose: 118 mg/dL — ABNORMAL HIGH (ref 65–99)
POTASSIUM: 4.6 mmol/L (ref 3.5–5.2)
Sodium: 143 mmol/L (ref 134–144)
TOTAL PROTEIN: 6.6 g/dL (ref 6.0–8.5)

## 2018-01-26 LAB — T4, FREE: Free T4: 0.97 ng/dL (ref 0.82–1.77)

## 2018-01-26 LAB — LIPID PANEL W/O CHOL/HDL RATIO
CHOLESTEROL TOTAL: 175 mg/dL (ref 100–199)
HDL: 35 mg/dL — ABNORMAL LOW (ref >39)
LDL CALC: 112 mg/dL — AB (ref 0–99)
TRIGLYCERIDES: 142 mg/dL (ref 0–149)
VLDL Cholesterol Cal: 28 mg/dL (ref 5–40)

## 2018-01-26 LAB — CBC
HEMOGLOBIN: 16 g/dL (ref 13.0–17.7)
Hematocrit: 45.7 % (ref 37.5–51.0)
MCH: 29.5 pg (ref 26.6–33.0)
MCHC: 35 g/dL (ref 31.5–35.7)
MCV: 84 fL (ref 79–97)
Platelets: 259 10*3/uL (ref 150–450)
RBC: 5.42 x10E6/uL (ref 4.14–5.80)
RDW: 14.5 % (ref 12.3–15.4)
WBC: 7.9 10*3/uL (ref 3.4–10.8)

## 2018-01-26 LAB — PSA: Prostate Specific Ag, Serum: <0.1 ng/mL (ref 0.0–4.0)

## 2018-01-26 LAB — TSH: TSH: 4.44 u[IU]/mL (ref 0.450–4.500)

## 2018-03-22 ENCOUNTER — Encounter: Payer: Self-pay | Admitting: Nurse Practitioner

## 2018-03-29 DIAGNOSIS — Z23 Encounter for immunization: Secondary | ICD-10-CM | POA: Diagnosis not present

## 2018-08-10 ENCOUNTER — Other Ambulatory Visit: Payer: Self-pay

## 2018-08-10 MED ORDER — AMLODIPINE BESY-BENAZEPRIL HCL 5-20 MG PO CAPS: 1.0000 | ORAL_CAPSULE | Freq: Every day | ORAL | 0 refills | Status: DC

## 2018-08-16 ENCOUNTER — Ambulatory Visit: Payer: 59 | Admitting: Nurse Practitioner

## 2018-08-16 VITALS — BP 137/88 | HR 85 | Resp 16 | Ht 74.0 in | Wt 291.2 lb

## 2018-08-16 DIAGNOSIS — I10 Essential (primary) hypertension: Secondary | ICD-10-CM | POA: Diagnosis not present

## 2018-08-16 DIAGNOSIS — F339 Major depressive disorder, recurrent, unspecified: Secondary | ICD-10-CM

## 2018-08-16 MED ORDER — FLUOXETINE HCL 20 MG PO TABS: 20.0000 mg | ORAL_TABLET | Freq: Every day | ORAL | 3 refills | Status: DC

## 2018-08-16 MED ORDER — AMLODIPINE BESY-BENAZEPRIL HCL 5-20 MG PO CAPS: 1.0000 | ORAL_CAPSULE | Freq: Every day | ORAL | 3 refills | Status: DC

## 2018-08-16 NOTE — Progress Notes (Signed)
[facility] [place], [place] [address]  Internal MEDICINE  Office Visit Note  Patient Name: [name]  [id number]  [id number]  Date of Service: 09/23/2018  Chief Complaint  Patient presents with  . Fatigue  . Weight Gain  . Depression    The patient is here for routine follow up exam. The patient is currently taking paxil 40mg  every day and feels like this is no longer really helping his depression and anxiety. States that is is irritable and often sad. Is losing interest in activities he would generally find fun and enjoyable. His blood pressure is well controlled and he has n other concerns or complaints today.       Current Medication: Outpatient Encounter Medications as of 08/16/2018  Medication Sig Note  . albuterol (PROAIR HFA) 108 (90 Base) MCG/ACT inhaler Inhale 1 puff into the lungs 4 (four) times daily.   Marland Kitchen amLODipine-benazepril (LOTREL) 5-20 MG capsule Take 1 capsule by mouth daily.   Marland Kitchen aspirin 81 MG chewable tablet Chew 81 mg by mouth daily.   Marland Kitchen FLUARIX QUADRIVALENT 0.5 ML injection inject 0.5 milliliter intramuscularly   . fluticasone (FLONASE) 50 MCG/ACT nasal spray Place 1 spray into both nostrils daily.   . [DISCONTINUED] amLODipine-benazepril (LOTREL) 5-20 MG capsule Take 1 capsule by mouth daily.   . [DISCONTINUED] FLUoxetine (PROZAC) 20 MG tablet Take 1 tablet (20 mg total) by mouth daily.   . [DISCONTINUED] PARoxetine (PAXIL) 40 MG tablet Take 1 tablet (40 mg total) by mouth every morning. 08/16/2018: change to fluoxetin.    No facility-administered encounter medications on file as of 08/16/2018.     Surgical History: Past Surgical History:  Procedure Laterality Date  . PROSTATE SURGERY    . REPLACEMENT TOTAL KNEE Right   . TONSILLECTOMY Bilateral     Medical History: Past Medical History:  Diagnosis Date  . Cancer [facility])    prostate and skin  . Hypertension   . Low testosterone   . Sleep apnea     Family  History: Family History  Problem Relation Age of Onset  . ALS Mother   . Cancer Father   . Cancer Paternal Grandfather     Social History   Socioeconomic History  . Marital status: Married    Spouse name: Not on file  . Number of children: Not on file  . Years of education: Not on file  . Highest education level: Not on file  Occupational History  . Not on file  Social Needs  . Financial resource strain: Not on file  . Food insecurity:    Worry: Not on file    Inability: Not on file  . Transportation needs:    Medical: Not on file    Non-medical: Not on file  Tobacco Use  . Smoking status: Former Research scientist (life sciences)  . Smokeless tobacco: Never Used  Substance and Sexual Activity  . Alcohol use: Yes    Comment: social  . Drug use: No  . Sexual activity: Not on file  Lifestyle  . Physical activity:    Days per week: Not on file    Minutes per session: Not on file  . Stress: Not on file  Relationships  . Social connections:    Talks on phone: Not on file    Gets together: Not on file    Attends religious service: Not on file    Active member of club or organization: Not on file    Attends meetings of clubs or  organizations: Not on file    Relationship status: Not on file  . Intimate partner violence:    Fear of current or ex partner: Not on file    Emotionally abused: Not on file    Physically abused: Not on file    Forced sexual activity: Not on file  Other Topics Concern  . Not on file  Social History Narrative  . Not on file      Review of Systems  Constitutional: Positive for fatigue. Negative for activity change and unexpected weight change.  HENT: Negative for ear pain, postnasal drip and sinus pressure.   Respiratory: Negative for chest tightness, shortness of breath and wheezing.   Cardiovascular: Negative for chest pain and palpitations.  Gastrointestinal: Negative for constipation, diarrhea, nausea and vomiting.  Endocrine: Negative for cold intolerance,  heat intolerance, polydipsia and polyuria.  Musculoskeletal: Negative for arthralgias, back pain, joint swelling and myalgias.  Skin: Negative for rash.  Allergic/Immunologic: Negative for environmental allergies.  Neurological: Negative for dizziness, tremors, seizures, weakness and headaches.  Hematological: Negative for adenopathy.  Psychiatric/Behavioral: Positive for sleep disturbance. The patient is nervous/anxious.        Worsening depression again. Feels irritable and often sad. Losing interest in activities he would normally find fun.     Today's Vitals   08/16/18 1130  BP: 137/88  Pulse: 85  Resp: 16  SpO2: 95%  Weight: 291 lb 3.2 oz (132.1 kg)  Height: 6\' 2"  (1.88 m)   Body mass index is 37.39 kg/m.  Physical Exam Vitals signs and nursing note reviewed.  Constitutional:      Appearance: Normal appearance. He is well-developed.  HENT:     Head: Normocephalic and atraumatic.  Eyes:     Pupils: Pupils are equal, round, and reactive to light.  Neck:     Musculoskeletal: Normal range of motion and neck supple.     Thyroid: No thyromegaly.     Vascular: No carotid bruit or JVD.  Cardiovascular:     Rate and Rhythm: Normal rate and regular rhythm.     Heart sounds: Normal heart sounds.  Pulmonary:     Effort: Pulmonary effort is normal.     Breath sounds: Normal breath sounds. No wheezing.  Abdominal:     General: Bowel sounds are normal.     Palpations: Abdomen is soft.     Tenderness: There is no abdominal tenderness.  Musculoskeletal: Normal range of motion.  Skin:    General: Skin is warm and dry.  Neurological:     Mental Status: He is alert and oriented to person, place, and time.  Psychiatric:        Behavior: Behavior normal.        Thought Content: Thought content normal.        Judgment: Judgment normal.    Assessment/Plan: 1. Essential hypertension Stable. Continue bp medication as prescribed. Refills provided today.  - amLODipine-benazepril  (LOTREL) 5-20 MG capsule; Take 1 capsule by mouth daily.  Dispense: 90 capsule; Refill: 3  2. Depression, recurrent (HCC) Change paroxetine to fluoxetine 20mg  daily. Written instructions provided to wean off paroxetine after starting new medication. He voiced understanding of these instructions.   General Counseling: velton roselle understanding of the findings of todays visit and agrees with plan of treatment. I have discussed any further diagnostic evaluation that may be needed or ordered today. We also reviewed his medications today. he has been encouraged to call the office with any questions or concerns that should  arise related to todays visit.   Hypertension Counseling:   The following hypertensive lifestyle modification were recommended and discussed:  1. Limiting alcohol intake to less than 1 oz/day of ethanol:(24 oz of beer or 8 oz of wine or 2 oz of 100-proof whiskey). 2. Take baby ASA 81 mg daily. 3. Importance of regular aerobic exercise and losing weight. 4. Reduce dietary saturated fat and cholesterol intake for overall cardiovascular health. 5. Maintaining adequate dietary potassium, calcium, and magnesium intake. 6. Regular monitoring of the blood pressure. 7. Reduce sodium intake to less than 100 mmol/day (less than 2.3 gm of sodium or less than 6 gm of sodium choride)   This patient was seen by [place] with Dr Lavera Guise as a part of collaborative care agreement   Meds ordered this encounter  Medications  . DISCONTD: FLUoxetine (PROZAC) 20 MG tablet    Sig: Take 1 tablet (20 mg total) by mouth daily.    Dispense:  30 tablet    Refill:  3    Patient to wean off paxil    Order Specific Question:   Supervising Provider    Answer:   Lavera Guise [[id number]]  . amLODipine-benazepril (LOTREL) 5-20 MG capsule    Sig: Take 1 capsule by mouth daily.    Dispense:  90 capsule    Refill:  3    Pt need appt for further refills    Order Specific  Question:   Supervising Provider    Answer:   Lavera Guise [[id number]]    Time spent: 25 Minutes      Dr Lavera Guise Internal medicine

## 2018-09-07 ENCOUNTER — Other Ambulatory Visit: Payer: Self-pay | Admitting: Nurse Practitioner

## 2018-09-07 DIAGNOSIS — F339 Major depressive disorder, recurrent, unspecified: Secondary | ICD-10-CM

## 2018-09-23 ENCOUNTER — Encounter: Payer: Self-pay | Admitting: Nurse Practitioner

## 2018-10-11 ENCOUNTER — Encounter: Payer: Self-pay | Admitting: Nurse Practitioner

## 2018-10-11 ENCOUNTER — Ambulatory Visit: Payer: 59 | Admitting: Nurse Practitioner

## 2018-10-11 ENCOUNTER — Other Ambulatory Visit: Payer: Self-pay

## 2018-10-11 VITALS — BP 143/87 | HR 62 | Resp 16 | Ht 74.0 in | Wt 283.0 lb

## 2018-10-11 DIAGNOSIS — I10 Essential (primary) hypertension: Secondary | ICD-10-CM | POA: Diagnosis not present

## 2018-10-11 DIAGNOSIS — F339 Major depressive disorder, recurrent, unspecified: Secondary | ICD-10-CM

## 2018-10-11 MED ORDER — FLUOXETINE HCL (PMDD) 20 MG PO TABS: 2.0000 | ORAL_TABLET | Freq: Every day | ORAL | 0 refills | Status: DC

## 2018-10-11 NOTE — Progress Notes (Signed)
[facility] [place], [place] [address]  Internal MEDICINE  Telephone Visit  Patient Name: [name]  [id number]  [id number]  Date of Service: 10/24/2018  I connected with the patient at 10:14am by webcam and verified the patients identity using two identifiers.   I discussed the limitations, risks, security and privacy concerns of performing an evaluation and management service by webcam and the availability of in person appointments. I also discussed with the patient that there may be a patient responsible charge related to the service.  The patient expressed understanding and agrees to proceed.    Chief Complaint  Patient presents with  . Telephone Assessment  . Telephone Screen  . Hypertension    The patient has been contacted via webcam for follow up visit due to concerns for spread of novel coronavirus. The patient was switched from paxil to prozac at 20mg  daily. Made the change very well. No problems. States that he does not feel very different. However, for the last six weeks, he continues to work very long hours during the coronavirus pandemic. 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 His staff does not have any PPE to use at all. He feels like some of his continues depression and anxiety may be due to emotional fatigue and stress. Physically, he states he feels great, except for fatigue.       Current Medication: Outpatient Encounter Medications as of 10/11/2018  Medication Sig  . albuterol (PROAIR HFA) 108 (90 Base) MCG/ACT inhaler Inhale 1 puff into the lungs 4 (four) times daily.  Marland Kitchen amLODipine-benazepril (LOTREL) 5-20 MG capsule Take 1 capsule by mouth daily.  Marland Kitchen aspirin 81 MG chewable tablet Chew 81 mg by mouth daily.  . Fluoxetine HCl, PMDD, 20 MG TABS Take 2 tablets (40 mg total) by mouth daily.  . [DISCONTINUED] Fluoxetine HCl, PMDD, 20 MG TABS TAKE 1 TABLET BY MOUTH EVERY  DAY  . FLUARIX QUADRIVALENT 0.5 ML injection inject 0.5 milliliter intramuscularly  . fluticasone (FLONASE) 50 MCG/ACT nasal spray Place 1 spray into both nostrils daily.   No facility-administered encounter medications on file as of 10/11/2018.     Surgical History: Past Surgical History:  Procedure Laterality Date  . PROSTATE SURGERY    . REPLACEMENT TOTAL KNEE Right   . TONSILLECTOMY Bilateral     Medical History: Past Medical History:  Diagnosis Date  . 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 Hypertension   . Low testosterone   . Sleep apnea     Family History: Family History  Problem Relation Age of Onset  . ALS Mother   . Cancer Father   . Cancer Paternal Grandfather     Social History   Socioeconomic History  . Marital status: Married    Spouse name: Not on file  . Number of children: Not on file  . Years of education: Not on file  . Highest education level: Not on file  Occupational History  . Not on file  Social Needs  . Financial resource strain: Not on file  . Food insecurity:    Worry: Not on file    Inability: Not on file  . Transportation needs:    Medical: Not on file    Non-medical: Not on file  Tobacco Use  . Smoking status: Former Research scientist (life sciences)  . Smokeless tobacco: Never Used  Substance and Sexual Activity  . Alcohol  use: Yes    Comment: social  . Drug use: No  . Sexual activity: Not on file  Lifestyle  . Physical activity:    Days per week: Not on file    Minutes per session: Not on file  . Stress: Not on file  Relationships  . Social connections:    Talks on phone: Not on file    Gets together: Not on file    Attends religious service: Not on file    Active member of club or organization: Not on file    Attends meetings of clubs or organizations: Not on file    Relationship status: Not on file  . Intimate partner violence:    Fear of current or ex partner: Not on file    Emotionally abused: Not on file    Physically abused: Not on  file    Forced sexual activity: Not on file  Other Topics Concern  . Not on file  Social History Narrative  . Not on file      Review of Systems  Constitutional: Positive for fatigue. Negative for activity change and unexpected weight change.  HENT: Negative for ear pain, postnasal drip and sinus pressure.   Respiratory: Negative for chest tightness, shortness of breath and wheezing.   Cardiovascular: Negative for chest pain and palpitations.  Gastrointestinal: Negative for constipation, diarrhea, nausea and vomiting.  Endocrine: Negative for cold intolerance, heat intolerance, polydipsia and polyuria.  Musculoskeletal: Negative for arthralgias, back pain, joint swelling and myalgias.  Skin: Negative for rash.  Allergic/Immunologic: Negative for environmental allergies.  Neurological: Negative for dizziness, tremors, seizures, weakness and headaches.  Hematological: Negative for adenopathy.  Psychiatric/Behavioral: Positive for dysphoric mood and sleep disturbance. The patient is nervous/anxious.        Depression has not changed much since changing paxil to prozac. Does not feel any worse though    Today's Vitals   10/11/18 1003  BP: (!) 143/87  Pulse: 62  Resp: 16  Weight: 283 lb (128.4 kg)  Height: 6\' 2"  (1.88 m)   Body mass index is 36.34 kg/m.  Observation/Objective:   The patient is alert and oriented. he is pleasant and answers all questions appropriately. Breathing is non-labored. he is in no acute distress at this time.    Assessment/Plan:  1. Essential hypertension  stable. Continue bp medication as prescribed   2. Depression, recurrent (HCC) Increase prozac 20mg  tablets to two tablets daily. Advised him to increase dose slowly. He voiced understanding.  - Fluoxetine HCl, PMDD, 20 MG TABS; Take 2 tablets (40 mg total) by mouth daily.  Dispense: 180 tablet; Refill: 0  General Counseling: [name] verbalizes understanding of the findings of today's phone visit  and agrees with plan of treatment. I have discussed any further diagnostic evaluation that may be needed or ordered today. We also reviewed his medications today. he has been encouraged to call the office with any questions or concerns that should arise related to todays visit.   This patient was seen by [place] with Dr Lavera Guise as a part of collaborative care agreement  Meds ordered this encounter  Medications  . Fluoxetine HCl, PMDD, 20 MG TABS    Sig: Take 2 tablets (40 mg total) by mouth daily.    Dispense:  180 tablet    Refill:  0    Please note, increasing to two tablets daily. Patient does not require new prescription at this time.    Order Specific Question:  Supervising Provider    Answer:   Lavera Guise [[id number]]    Time spent: 9 Minutes    Dr Lavera Guise Internal medicine

## 2018-11-06 ENCOUNTER — Other Ambulatory Visit: Payer: Self-pay

## 2018-11-06 DIAGNOSIS — I10 Essential (primary) hypertension: Secondary | ICD-10-CM

## 2018-11-06 MED ORDER — AMLODIPINE BESY-BENAZEPRIL HCL 5-20 MG PO CAPS: 1.0000 | ORAL_CAPSULE | Freq: Every day | ORAL | 1 refills | Status: DC

## 2018-11-08 ENCOUNTER — Other Ambulatory Visit: Payer: Self-pay

## 2018-11-08 DIAGNOSIS — I10 Essential (primary) hypertension: Secondary | ICD-10-CM

## 2018-11-08 MED ORDER — AMLODIPINE BESY-BENAZEPRIL HCL 5-20 MG PO CAPS: 1.0000 | ORAL_CAPSULE | Freq: Every day | ORAL | 1 refills | Status: DC

## 2019-01-07 ENCOUNTER — Other Ambulatory Visit: Payer: Self-pay

## 2019-01-07 DIAGNOSIS — F339 Major depressive disorder, recurrent, unspecified: Secondary | ICD-10-CM

## 2019-01-07 MED ORDER — FLUOXETINE HCL (PMDD) 20 MG PO TABS: 2.0000 | ORAL_TABLET | Freq: Every day | ORAL | 0 refills | Status: DC

## 2019-01-15 ENCOUNTER — Encounter: Payer: 59 | Admitting: Nurse Practitioner

## 2019-02-12 ENCOUNTER — Encounter: Payer: Self-pay | Admitting: Nurse Practitioner

## 2019-02-13 ENCOUNTER — Other Ambulatory Visit: Payer: Self-pay

## 2019-02-13 DIAGNOSIS — F339 Major depressive disorder, recurrent, unspecified: Secondary | ICD-10-CM

## 2019-02-13 MED ORDER — FLUOXETINE HCL (PMDD) 20 MG PO TABS: 2.0000 | ORAL_TABLET | Freq: Every day | ORAL | 0 refills | Status: DC

## 2019-03-14 ENCOUNTER — Encounter: Payer: Self-pay | Admitting: Nurse Practitioner

## 2019-03-14 ENCOUNTER — Ambulatory Visit (INDEPENDENT_AMBULATORY_CARE_PROVIDER_SITE_OTHER): Payer: 59 | Admitting: Nurse Practitioner

## 2019-03-14 ENCOUNTER — Other Ambulatory Visit: Payer: Self-pay

## 2019-03-14 VITALS — BP 160/90 | HR 70 | Temp 96.9°F | Resp 16 | Ht 74.0 in | Wt 284.0 lb

## 2019-03-14 DIAGNOSIS — R300 Dysuria: Secondary | ICD-10-CM | POA: Diagnosis not present

## 2019-03-14 DIAGNOSIS — F339 Major depressive disorder, recurrent, unspecified: Secondary | ICD-10-CM | POA: Diagnosis not present

## 2019-03-14 DIAGNOSIS — I10 Essential (primary) hypertension: Secondary | ICD-10-CM | POA: Diagnosis not present

## 2019-03-14 DIAGNOSIS — Z0001 Encounter for general adult medical examination with abnormal findings: Secondary | ICD-10-CM | POA: Diagnosis not present

## 2019-03-14 MED ORDER — AMLODIPINE BESY-BENAZEPRIL HCL 10-20 MG PO CAPS: 1.0000 | ORAL_CAPSULE | Freq: Every day | ORAL | 3 refills | Status: DC

## 2019-03-14 NOTE — Progress Notes (Signed)
[facility] [place], [place] [address]  Internal MEDICINE  Office Visit Note  Patient Name: [name]  [id number]  [id number]  Date of Service: 03/24/2019   Pt is here for routine health maintenance examination  Chief Complaint  Patient presents with  . Annual Exam  . Hypertension  . Fatigue  . Memory Loss    wife is worried about memory loss     The patient is here for health maintenance exam. He is concerned about memory loss. Typically, he is forgetting names of people he should know. He does have increased stress and responsibility at work since onset of [place] [employer]. Work force was reduced and work load has increased. He is fatigued. Blood pressure is elevated. Due to have fasting blood work done.     Current Medication: Outpatient Encounter Medications as of 03/14/2019  Medication Sig Note  . albuterol (PROAIR HFA) 108 (90 Base) MCG/ACT inhaler Inhale 1 puff into the lungs 4 (four) times daily.   Marland Kitchen aspirin 81 MG chewable tablet Chew 81 mg by mouth daily.   Marland Kitchen FLUARIX QUADRIVALENT 0.5 ML injection inject 0.5 milliliter intramuscularly   . Fluoxetine HCl, PMDD, 20 MG TABS Take 2 tablets (40 mg total) by mouth daily.   . fluticasone (FLONASE) 50 MCG/ACT nasal spray Place 1 spray into both nostrils daily.   . [DISCONTINUED] amLODipine-benazepril (LOTREL) 5-20 MG capsule Take 1 capsule by mouth daily. 03/14/2019: increased dose   . amLODipine-benazepril (LOTREL) 10-20 MG capsule Take 1 capsule by mouth daily.    No facility-administered encounter medications on file as of 03/14/2019.     Surgical History: Past Surgical History:  Procedure Laterality Date  . PROSTATE SURGERY    . REPLACEMENT TOTAL KNEE Right   . TONSILLECTOMY Bilateral     Medical History: Past Medical History:  Diagnosis Date  . Cancer [facility])    prostate and skin  . Hypertension   . Low testosterone   . Sleep apnea     Family History: Family History  Problem  Relation Age of Onset  . ALS Mother   . Cancer Father   . Cancer Paternal Grandfather       Review of Systems  Constitutional: Positive for fatigue. Negative for activity change and unexpected weight change.  HENT: Negative for ear pain, postnasal drip and sinus pressure.   Respiratory: Negative for chest tightness, shortness of breath and wheezing.   Cardiovascular: Negative for chest pain and palpitations.  Gastrointestinal: Negative for constipation, diarrhea, nausea and vomiting.  Endocrine: Negative for cold intolerance, heat intolerance, polydipsia and polyuria.  Genitourinary: Negative for frequency and urgency.  Musculoskeletal: Negative for arthralgias, back pain, joint swelling and myalgias.  Skin: Negative for rash.  Allergic/Immunologic: Negative for environmental allergies.  Neurological: Negative for dizziness, tremors, seizures, weakness and headaches.  Hematological: Negative for adenopathy.  Psychiatric/Behavioral: Positive for dysphoric mood and sleep disturbance. The patient is nervous/anxious.        Feels as though depression is being well controlled in light of circumstances regarding COVID 19    Today's Vitals   03/14/19 1427  BP: (!) 160/90  Pulse: 70  Resp: 16  Temp: (!) 96.9 F (36.1 C)  SpO2: 97%  Weight: 284 lb (128.8 kg)  Height: 6\' 2"  (1.88 m)   Body mass index is 36.46 kg/m.  Physical Exam Vitals signs and nursing note reviewed.  Constitutional:      Appearance: Normal appearance. He is well-developed.  HENT:  Head: Normocephalic and atraumatic.     Nose: Nose normal.  Eyes:     Pupils: Pupils are equal, round, and reactive to light.  Neck:     Musculoskeletal: Normal range of motion and neck supple.     Thyroid: No thyromegaly.     Vascular: No carotid bruit or JVD.  Cardiovascular:     Rate and Rhythm: Normal rate and regular rhythm.     Pulses: Normal pulses.     Heart sounds: Normal heart sounds.  Pulmonary:     Effort:  Pulmonary effort is normal.     Breath sounds: Normal breath sounds. No wheezing.  Abdominal:     General: Bowel sounds are normal.     Palpations: Abdomen is soft.     Tenderness: There is no abdominal tenderness.  Musculoskeletal: Normal range of motion.  Skin:    General: Skin is warm and dry.  Neurological:     Mental Status: He is alert and oriented to person, place, and time.  Psychiatric:        Behavior: Behavior normal.        Thought Content: Thought content normal.        Judgment: Judgment normal.    Assessment/Plan: 1. Encounter for general adult medical examination with abnormal findings Annual health maintenance exam today.   2. Essential hypertension Increase amlodipine/benazepril to 10/20mg  daily. Encourage a low salt, heart healthy diet. Advised him to monitor blood pressures closely.  - amLODipine-benazepril (LOTREL) 10-20 MG capsule; Take 1 capsule by mouth daily.  Dispense: 30 capsule; Refill: 3  3. Depression, recurrent ([place]) Doing well on current dose prozac. Continue as prescribed   4. Dysuria - UA/M w/rflx Culture, Routine  General Counseling: [name] verbalizes understanding of the findings of todays visit and agrees with plan of treatment. I have discussed any further diagnostic evaluation that may be needed or ordered today. We also reviewed his medications today. he has been encouraged to call the office with any questions or concerns that should arise related to todays visit.    Counseling:  Hypertension Counseling:   The following hypertensive lifestyle modification were recommended and discussed:  1. Limiting alcohol intake to less than 1 oz/day of ethanol:(24 oz of beer or 8 oz of wine or 2 oz of 100-proof whiskey). 2. Take baby ASA 81 mg daily. 3. Importance of regular aerobic exercise and losing weight. 4. Reduce dietary saturated fat and cholesterol intake for overall cardiovascular health. 5. Maintaining adequate dietary potassium,  calcium, and magnesium intake. 6. Regular monitoring of the blood pressure. 7. Reduce sodium intake to less than 100 mmol/day (less than 2.3 gm of sodium or less than 6 gm of sodium choride)   This patient was seen by [place] with Dr Lavera Guise as a part of collaborative care agreement  Orders Placed This Encounter  Procedures  . Microscopic Examination  . UA/M w/rflx Culture, Routine    Meds ordered this encounter  Medications  . amLODipine-benazepril (LOTREL) 10-20 MG capsule    Sig: Take 1 capsule by mouth daily.    Dispense:  30 capsule    Refill:  3    Order Specific Question:   Supervising Provider    Answer:   Lavera Guise [[id number]]    Time spent: [place], MD  Internal Medicine

## 2019-03-15 LAB — UA/M W/RFLX CULTURE, ROUTINE
Bilirubin, UA: NEGATIVE
Glucose, UA: NEGATIVE
Ketones, UA: NEGATIVE
Leukocytes,UA: NEGATIVE
Nitrite, UA: NEGATIVE
Protein,UA: NEGATIVE
RBC, UA: NEGATIVE
Specific Gravity, UA: 1.024 (ref 1.005–1.030)
Urobilinogen, Ur: 0.2 mg/dL (ref 0.2–1.0)
pH, UA: 5.5 (ref 5.0–7.5)

## 2019-03-15 LAB — MICROSCOPIC EXAMINATION
Bacteria, UA: NONE SEEN
Casts: NONE SEEN /lpf
Epithelial Cells (non renal): NONE SEEN /hpf (ref 0–10)

## 2019-03-24 DIAGNOSIS — Z0001 Encounter for general adult medical examination with abnormal findings: Secondary | ICD-10-CM | POA: Insufficient documentation

## 2019-03-24 DIAGNOSIS — R300 Dysuria: Secondary | ICD-10-CM | POA: Insufficient documentation

## 2019-03-28 ENCOUNTER — Other Ambulatory Visit: Payer: Self-pay | Admitting: Internal Medicine

## 2019-03-29 LAB — CBC
Hematocrit: 52.3 % — ABNORMAL HIGH (ref 37.5–51.0)
Hemoglobin: 16.5 g/dL (ref 13.0–17.7)
MCH: 27.2 pg (ref 26.6–33.0)
MCHC: 31.5 g/dL (ref 31.5–35.7)
MCV: 86 fL (ref 79–97)
Platelets: 311 10*3/uL (ref 150–450)
RBC: 6.07 x10E6/uL — ABNORMAL HIGH (ref 4.14–5.80)
RDW: 14.2 % (ref 11.6–15.4)
WBC: 8.6 10*3/uL (ref 3.4–10.8)

## 2019-03-29 LAB — LIPID PANEL W/O CHOL/HDL RATIO
Cholesterol, Total: 182 mg/dL (ref 100–199)
HDL: 38 mg/dL — ABNORMAL LOW (ref >39)
LDL Chol Calc (NIH): 120 mg/dL — ABNORMAL HIGH (ref 0–99)
Triglycerides: 135 mg/dL (ref 0–149)
VLDL Cholesterol Cal: 24 mg/dL (ref 5–40)

## 2019-03-29 LAB — TSH

## 2019-03-29 LAB — COMPREHENSIVE METABOLIC PANEL
ALT: 19 IU/L (ref 0–44)
AST: 20 IU/L (ref 0–40)
Albumin/Globulin Ratio: 2 (ref 1.2–2.2)
Albumin: 4.5 g/dL (ref 3.8–4.8)
Alkaline Phosphatase: 95 IU/L (ref 39–117)
BUN/Creatinine Ratio: 13 (ref 10–24)
BUN: 16 mg/dL (ref 8–27)
Bilirubin Total: 0.5 mg/dL (ref 0.0–1.2)
CO2: 23 mmol/L (ref 20–29)
Calcium: 9.7 mg/dL (ref 8.6–10.2)
Chloride: 104 mmol/L (ref 96–106)
Creatinine, Ser: 1.19 mg/dL (ref 0.76–1.27)
GFR calc Af Amer: 75 mL/min/{1.73_m2} (ref >59)
GFR calc non Af Amer: 65 mL/min/{1.73_m2} (ref >59)
Globulin, Total: 2.2 g/dL (ref 1.5–4.5)
Glucose: 110 mg/dL — ABNORMAL HIGH (ref 65–99)
Potassium: 5 mmol/L (ref 3.5–5.2)
Sodium: 141 mmol/L (ref 134–144)
Total Protein: 6.7 g/dL (ref 6.0–8.5)

## 2019-03-29 LAB — HGB A1C W/O EAG: Hgb A1c MFr Bld: 6.1 % — ABNORMAL HIGH (ref 4.8–5.6)

## 2019-03-29 LAB — THYROID CASCADE PROFILE: TSH: 3.59 u[IU]/mL (ref 0.450–4.500)

## 2019-03-29 LAB — PSA: Prostate Specific Ag, Serum: <0.1 ng/mL (ref 0.0–4.0)

## 2019-04-11 ENCOUNTER — Ambulatory Visit: Payer: 59 | Admitting: Adult Health

## 2019-04-11 ENCOUNTER — Other Ambulatory Visit: Payer: Self-pay

## 2019-04-11 VITALS — BP 142/83 | HR 75 | Resp 16 | Ht 74.0 in | Wt 284.0 lb

## 2019-04-11 DIAGNOSIS — I10 Essential (primary) hypertension: Secondary | ICD-10-CM | POA: Diagnosis not present

## 2019-04-11 DIAGNOSIS — R7303 Prediabetes: Secondary | ICD-10-CM | POA: Diagnosis not present

## 2019-04-11 DIAGNOSIS — E668 Other obesity: Secondary | ICD-10-CM

## 2019-04-11 NOTE — Progress Notes (Signed)
[facility] [place], [place] [address]  Internal MEDICINE  Office Visit Note  Patient Name: [name]  [id number]  [id number]  Date of Service: 04/11/2019  Chief Complaint  Patient presents with  . Follow-up    lab results   . Hypertension    HPI  Pt is here to review labs and follow up on htn.  His lotrel was increased at last visit, and today he is better controlled.  He reports at home his pressure has been even better than it is in the office today. He denies any side effects from increasing the medication.    Current Medication: Outpatient Encounter Medications as of 04/11/2019  Medication Sig  . albuterol (PROAIR HFA) 108 (90 Base) MCG/ACT inhaler Inhale 1 puff into the lungs 4 (four) times daily.  Marland Kitchen amLODipine-benazepril (LOTREL) 10-20 MG capsule Take 1 capsule by mouth daily.  Marland Kitchen aspirin 81 MG chewable tablet Chew 81 mg by mouth daily.  Marland Kitchen FLUARIX QUADRIVALENT 0.5 ML injection inject 0.5 milliliter intramuscularly  . Fluoxetine HCl, PMDD, 20 MG TABS Take 2 tablets (40 mg total) by mouth daily.  . fluticasone (FLONASE) 50 MCG/ACT nasal spray Place 1 spray into both nostrils daily.   No facility-administered encounter medications on file as of 04/11/2019.     Surgical History: Past Surgical History:  Procedure Laterality Date  . PROSTATE SURGERY    . REPLACEMENT TOTAL KNEE Right   . TONSILLECTOMY Bilateral     Medical History: Past Medical History:  Diagnosis Date  . Cancer [facility])    prostate and skin  . Hypertension   . Low testosterone   . Sleep apnea     Family History: Family History  Problem Relation Age of Onset  . ALS Mother   . Cancer Father   . Cancer Paternal Grandfather     Social History   Socioeconomic History  . Marital status: Married    Spouse name: Not on file  . Number of children: Not on file  . Years of education: Not on file  . Highest education level: Not on file  Occupational History  . Not on  file  Social Needs  . Financial resource strain: Not on file  . Food insecurity    Worry: Not on file    Inability: Not on file  . Transportation needs    Medical: Not on file    Non-medical: Not on file  Tobacco Use  . Smoking status: Former Research scientist (life sciences)  . Smokeless tobacco: Former Systems developer    Types: Chew  Substance and Sexual Activity  . Alcohol use: Yes    Comment: social  . Drug use: No  . Sexual activity: Not on file  Lifestyle  . Physical activity    Days per week: Not on file    Minutes per session: Not on file  . Stress: Not on file  Relationships  . Social Herbalist on phone: Not on file    Gets together: Not on file    Attends religious service: Not on file    Active member of club or organization: Not on file    Attends meetings of clubs or organizations: Not on file    Relationship status: Not on file  . Intimate partner violence    Fear of current or ex partner: Not on file    Emotionally abused: Not on file    Physically abused: Not on file    Forced sexual activity: Not on  file  Other Topics Concern  . Not on file  Social History Narrative  . Not on file      Review of Systems  Constitutional: Negative.  Negative for chills, fatigue and unexpected weight change.  HENT: Negative.  Negative for congestion, rhinorrhea, sneezing and sore throat.   Eyes: Negative for redness.  Respiratory: Negative.  Negative for cough, chest tightness and shortness of breath.   Cardiovascular: Negative.  Negative for chest pain and palpitations.  Gastrointestinal: Negative.  Negative for abdominal pain, constipation, diarrhea, nausea and vomiting.  Endocrine: Negative.   Genitourinary: Negative.  Negative for dysuria and frequency.  Musculoskeletal: Negative.  Negative for arthralgias, back pain, joint swelling and neck pain.  Skin: Negative.  Negative for rash.  Allergic/Immunologic: Negative.   Neurological: Negative.  Negative for tremors and numbness.   Hematological: Negative for adenopathy. Does not bruise/bleed easily.  Psychiatric/Behavioral: Negative.  Negative for behavioral problems, sleep disturbance and suicidal ideas. The patient is not nervous/anxious.     Vital Signs: BP (!) 142/83   Pulse 75   Resp 16   Ht 6\' 2"  (1.88 m)   Wt 284 lb (128.8 kg)   SpO2 96%   BMI 36.46 kg/m    Physical Exam Vitals signs and nursing note reviewed.  Constitutional:      General: He is not in acute distress.    Appearance: He is well-developed. He is not diaphoretic.  HENT:     Head: Normocephalic and atraumatic.     Mouth/Throat:     Pharynx: No oropharyngeal exudate.  Eyes:     Pupils: Pupils are equal, round, and reactive to light.  Neck:     Musculoskeletal: Normal range of motion and neck supple.     Thyroid: No thyromegaly.     Vascular: No JVD.     Trachea: No tracheal deviation.  Cardiovascular:     Rate and Rhythm: Normal rate and regular rhythm.     Heart sounds: Normal heart sounds. No murmur. No friction rub. No gallop.   Pulmonary:     Effort: Pulmonary effort is normal. No respiratory distress.     Breath sounds: Normal breath sounds. No wheezing or rales.  Chest:     Chest wall: No tenderness.  Abdominal:     Palpations: Abdomen is soft.     Tenderness: There is no abdominal tenderness. There is no guarding.  Musculoskeletal: Normal range of motion.  Lymphadenopathy:     Cervical: No cervical adenopathy.  Skin:    General: Skin is warm and dry.  Neurological:     Mental Status: He is alert and oriented to person, place, and time.     Cranial Nerves: No cranial nerve deficit.  Psychiatric:        Behavior: Behavior normal.        Thought Content: Thought content normal.        Judgment: Judgment normal.    Assessment/Plan: 1. Essential hypertension Controlled, continue to take medications as prescribed.   2. Moderate obesity Obesity Counseling: Risk Assessment: An assessment of behavioral risk  factors was made today and includes lack of exercise sedentary lifestyle, lack of portion control and poor dietary habits.  Risk Modification Advice: She was counseled on portion control guidelines. Restricting daily caloric intake to. . The detrimental long term effects of obesity on her health and ongoing poor compliance was also discussed with the patient.  3. Pre-diabetes We had a lengthy discussion about A1C.  His is currently 6.1.  We discussed dietary modification and exercise.  He verbalized understanding.  He would like to work on this, and not start medication.    General Counseling: maxten puri understanding of the findings of todays visit and agrees with plan of treatment. I have discussed any further diagnostic evaluation that may be needed or ordered today. We also reviewed his medications today. he has been encouraged to call the office with any questions or concerns that should arise related to todays visit.    No orders of the defined types were placed in this encounter.   No orders of the defined types were placed in this encounter.   Time spent: 25 Minutes   This patient was seen by Orson Gear AGNP-C in Collaboration with Dr Lavera Guise as a part of collaborative care agreement     Kendell Bane AGNP-C Internal medicine

## 2019-04-29 ENCOUNTER — Encounter: Payer: Self-pay | Admitting: Nurse Practitioner

## 2019-05-10 ENCOUNTER — Other Ambulatory Visit: Payer: Self-pay | Admitting: Nurse Practitioner

## 2019-05-10 DIAGNOSIS — F339 Major depressive disorder, recurrent, unspecified: Secondary | ICD-10-CM

## 2019-05-10 MED ORDER — FLUOXETINE HCL (PMDD) 20 MG PO TABS: 2.0000 | ORAL_TABLET | Freq: Every day | ORAL | 0 refills | Status: DC

## 2019-05-20 ENCOUNTER — Encounter: Payer: Self-pay | Admitting: Nurse Practitioner

## 2019-05-21 ENCOUNTER — Other Ambulatory Visit: Payer: Self-pay

## 2019-05-21 DIAGNOSIS — I10 Essential (primary) hypertension: Secondary | ICD-10-CM

## 2019-05-21 MED ORDER — AMLODIPINE BESY-BENAZEPRIL HCL 10-20 MG PO CAPS: 1.0000 | ORAL_CAPSULE | Freq: Every day | ORAL | 3 refills | Status: DC

## 2019-07-12 ENCOUNTER — Encounter: Payer: Self-pay | Admitting: Nurse Practitioner

## 2019-08-01 ENCOUNTER — Encounter: Payer: Self-pay | Admitting: Nurse Practitioner

## 2019-08-02 ENCOUNTER — Other Ambulatory Visit: Payer: Self-pay | Admitting: Nurse Practitioner

## 2019-08-02 DIAGNOSIS — F339 Major depressive disorder, recurrent, unspecified: Secondary | ICD-10-CM

## 2019-08-02 MED ORDER — FLUOXETINE HCL (PMDD) 20 MG PO TABS: 3.0000 | ORAL_TABLET | Freq: Every day | ORAL | 1 refills | Status: DC

## 2019-08-02 NOTE — Progress Notes (Signed)
refilledfluoxetine 20mg , taking 60mg  daily. New prescription sent to the pharmacy today.

## 2019-09-09 ENCOUNTER — Other Ambulatory Visit: Payer: Self-pay

## 2019-09-09 DIAGNOSIS — I10 Essential (primary) hypertension: Secondary | ICD-10-CM

## 2019-09-09 MED ORDER — AMLODIPINE BESY-BENAZEPRIL HCL 10-20 MG PO CAPS: 1.0000 | ORAL_CAPSULE | Freq: Every day | ORAL | 3 refills | Status: DC

## 2019-10-08 ENCOUNTER — Telehealth: Payer: Self-pay

## 2019-10-08 NOTE — Telephone Encounter (Signed)
Patient rescheduled appointment on 10/10/2019 to 10/17/2019. klh

## 2019-10-10 ENCOUNTER — Ambulatory Visit: Payer: 59 | Admitting: Adult Health

## 2019-10-15 ENCOUNTER — Telehealth: Payer: Self-pay

## 2019-10-15 NOTE — Telephone Encounter (Signed)
Confirmed appointment on 10/17/2019 and screened for covid. klh 

## 2019-10-15 NOTE — Telephone Encounter (Signed)
Called lmom informing patient of appointment on 10/17/2019. klh 

## 2019-10-17 ENCOUNTER — Ambulatory Visit: Payer: 59 | Admitting: Adult Health

## 2019-10-17 ENCOUNTER — Encounter: Payer: Self-pay | Admitting: Adult Health

## 2019-10-17 ENCOUNTER — Other Ambulatory Visit: Payer: Self-pay

## 2019-10-17 VITALS — BP 140/87 | HR 78 | Temp 97.4°F | Resp 16 | Ht 74.0 in | Wt 275.4 lb

## 2019-10-17 DIAGNOSIS — R7303 Prediabetes: Secondary | ICD-10-CM

## 2019-10-17 DIAGNOSIS — R5383 Other fatigue: Secondary | ICD-10-CM

## 2019-10-17 DIAGNOSIS — Z6835 Body mass index (BMI) 35.0-35.9, adult: Secondary | ICD-10-CM

## 2019-10-17 DIAGNOSIS — Z1211 Encounter for screening for malignant neoplasm of colon: Secondary | ICD-10-CM | POA: Diagnosis not present

## 2019-10-17 DIAGNOSIS — I10 Essential (primary) hypertension: Secondary | ICD-10-CM

## 2019-10-17 LAB — POCT GLYCOSYLATED HEMOGLOBIN (HGB A1C): Hemoglobin A1C: 6 % — AB (ref 4.0–5.6)

## 2019-10-17 NOTE — Progress Notes (Signed)
[facility] [place], [place] [address]  Internal MEDICINE  Office Visit Note  Patient Name: [name]  [id number]  [id number]  Date of Service: 10/17/2019  Chief Complaint  Patient presents with  . Follow-up  . Hypertension  . Quality Metric Gaps    needs cologuard    HPI  PT is here for follow up on pre-DM.  He is in need of cologuard to close metric gaps.  He reports some fatigue over the last few weeks.  He denies any other complaints at this time.  His blood pressure is controlled at this time.    Current Medication: Outpatient Encounter Medications as of 10/17/2019  Medication Sig  . albuterol (PROAIR HFA) 108 (90 Base) MCG/ACT inhaler Inhale 1 puff into the lungs 4 (four) times daily.  Marland Kitchen amLODipine-benazepril (LOTREL) 10-20 MG capsule Take 1 capsule by mouth daily.  Marland Kitchen aspirin 81 MG chewable tablet Chew 81 mg by mouth daily.  Marland Kitchen FLUARIX QUADRIVALENT 0.5 ML injection inject 0.5 milliliter intramuscularly  . Fluoxetine HCl, PMDD, 20 MG TABS Take 3 tablets (60 mg total) by mouth daily.  . fluticasone (FLONASE) 50 MCG/ACT nasal spray Place 1 spray into both nostrils daily.   No facility-administered encounter medications on file as of 10/17/2019.    Surgical History: Past Surgical History:  Procedure Laterality Date  . PROSTATE SURGERY    . REPLACEMENT TOTAL KNEE Right   . TONSILLECTOMY Bilateral     Medical History: Past Medical History:  Diagnosis Date  . Cancer [facility])    prostate and skin  . Hypertension   . Low testosterone   . Sleep apnea     Family History: Family History  Problem Relation Age of Onset  . ALS Mother   . Cancer Father   . Cancer Paternal Grandfather     Social History   Socioeconomic History  . Marital status: Married    Spouse name: Not on file  . Number of children: Not on file  . Years of education: Not on file  . Highest education level: Not on file  Occupational History  . Not on file   Tobacco Use  . Smoking status: Former Research scientist (life sciences)  . Smokeless tobacco: Former Systems developer    Types: Chew  Substance and Sexual Activity  . Alcohol use: Yes    Comment: social  . Drug use: No  . Sexual activity: Not on file  Other Topics Concern  . Not on file  Social History Narrative  . Not on file   Social Determinants of Health   Financial Resource Strain:   . Difficulty of Paying Living Expenses:   Food Insecurity:   . Worried About Charity fundraiser in the Last Year:   . Arboriculturist in the Last Year:   Transportation Needs:   . Film/video editor (Medical):   Marland Kitchen Lack of Transportation (Non-Medical):   Physical Activity:   . Days of Exercise per Week:   . Minutes of Exercise per Session:   Stress:   . Feeling of Stress :   Social Connections:   . Frequency of Communication with Friends and Family:   . Frequency of Social Gatherings with Friends and Family:   . Attends Religious Services:   . Active Member of Clubs or Organizations:   . Attends Archivist Meetings:   Marland Kitchen Marital Status:   Intimate Partner Violence:   . Fear of Current or Ex-Partner:   . Emotionally Abused:   .  Physically Abused:   . Sexually Abused:       Review of Systems  Constitutional: Negative.  Negative for chills, fatigue and unexpected weight change.  HENT: Negative.  Negative for congestion, rhinorrhea, sneezing and sore throat.   Eyes: Negative for redness.  Respiratory: Negative.  Negative for cough, chest tightness and shortness of breath.   Cardiovascular: Negative.  Negative for chest pain and palpitations.  Gastrointestinal: Negative.  Negative for abdominal pain, constipation, diarrhea, nausea and vomiting.  Endocrine: Negative.   Genitourinary: Negative.  Negative for dysuria and frequency.  Musculoskeletal: Negative.  Negative for arthralgias, back pain, joint swelling and neck pain.  Skin: Negative.  Negative for rash.  Allergic/Immunologic: Negative.    Neurological: Negative.  Negative for tremors and numbness.  Hematological: Negative for adenopathy. Does not bruise/bleed easily.  Psychiatric/Behavioral: Negative.  Negative for behavioral problems, sleep disturbance and suicidal ideas. The patient is not nervous/anxious.     Vital Signs: BP 140/87   Pulse 78   Temp (!) 97.4 F (36.3 C)   Resp 16   Ht 6\' 2"  (1.88 m)   Wt 275 lb 6.4 oz (124.9 kg)   SpO2 94%   BMI 35.36 kg/m    Physical Exam Vitals and nursing note reviewed.  Constitutional:      General: He is not in acute distress.    Appearance: He is well-developed. He is not diaphoretic.  HENT:     Head: Normocephalic and atraumatic.     Mouth/Throat:     Pharynx: No oropharyngeal exudate.  Eyes:     Pupils: Pupils are equal, round, and reactive to light.  Neck:     Thyroid: No thyromegaly.     Vascular: No JVD.     Trachea: No tracheal deviation.  Cardiovascular:     Rate and Rhythm: Normal rate and regular rhythm.     Heart sounds: Normal heart sounds. No murmur. No friction rub. No gallop.   Pulmonary:     Effort: Pulmonary effort is normal. No respiratory distress.     Breath sounds: Normal breath sounds. No wheezing or rales.  Chest:     Chest wall: No tenderness.  Abdominal:     Palpations: Abdomen is soft.     Tenderness: There is no abdominal tenderness. There is no guarding.  Musculoskeletal:        General: Normal range of motion.     Cervical back: Normal range of motion and neck supple.  Lymphadenopathy:     Cervical: No cervical adenopathy.  Skin:    General: Skin is warm and dry.  Neurological:     Mental Status: He is alert and oriented to person, place, and time.     Cranial Nerves: No cranial nerve deficit.  Psychiatric:        Behavior: Behavior normal.        Thought Content: Thought content normal.        Judgment: Judgment normal.    Assessment/Plan: 1. Pre-diabetes A1C remains controlled at 6.0 continue present management. -  POCT HgB A1C  2. Other fatigue Have labs done to eval vitamin levels. - Fe+TIBC+Fer - B12 and Folate Panel - Vitamin D (25 hydroxy)  3. Screen for colon cancer cologuard ordered at this time.   4. Essential hypertension Stable, continue current management.   5. BMI 35.0-35.9,adult Obesity Counseling: Risk Assessment: An assessment of behavioral risk factors was made today and includes lack of exercise sedentary lifestyle, lack of portion control and poor dietary  habits.  Risk Modification Advice: She was counseled on portion control guidelines. Restricting daily caloric intake to 1800. The detrimental long term effects of obesity on her health and ongoing poor compliance was also discussed with the patient.    General Counseling: dermont quarterman understanding of the findings of todays visit and agrees with plan of treatment. I have discussed any further diagnostic evaluation that may be needed or ordered today. We also reviewed his medications today. he has been encouraged to call the office with any questions or concerns that should arise related to todays visit.    Orders Placed This Encounter  Procedures  . Fe+TIBC+Fer  . B12 and Folate Panel  . Vitamin D (25 hydroxy)  . POCT HgB A1C    No orders of the defined types were placed in this encounter.   Time spent: 30 Minutes   This patient was seen by Orson Gear AGNP-C in Collaboration with Dr Lavera Guise as a part of collaborative care agreement     Kendell Bane AGNP-C Internal medicine

## 2019-10-18 LAB — B12 AND FOLATE PANEL
Folate: 12.6 ng/mL (ref >3.0)
Vitamin B-12: 464 pg/mL (ref 232–1245)

## 2019-10-18 LAB — IRON,TIBC AND FERRITIN PANEL
Ferritin: 334 ng/mL (ref 30–400)
Iron Saturation: 18 % (ref 15–55)
Iron: 63 ug/dL (ref 38–169)
Total Iron Binding Capacity: 356 ug/dL (ref 250–450)
UIBC: 293 ug/dL (ref 111–343)

## 2019-10-18 LAB — VITAMIN D 25 HYDROXY (VIT D DEFICIENCY, FRACTURES): Vit D, 25-Hydroxy: 24.4 ng/mL — ABNORMAL LOW (ref 30.0–100.0)

## 2019-10-29 ENCOUNTER — Telehealth: Payer: Self-pay

## 2019-10-29 NOTE — Telephone Encounter (Signed)
Called lmom informing patient of appointment on 10/31/2019. klh

## 2019-10-31 ENCOUNTER — Encounter: Payer: Self-pay | Admitting: Adult Health

## 2019-10-31 ENCOUNTER — Ambulatory Visit: Payer: 59 | Admitting: Adult Health

## 2019-10-31 ENCOUNTER — Other Ambulatory Visit: Payer: Self-pay

## 2019-10-31 VITALS — BP 143/87 | HR 77 | Temp 96.2°F | Resp 16 | Ht 74.0 in | Wt 276.0 lb

## 2019-10-31 DIAGNOSIS — N529 Male erectile dysfunction, unspecified: Secondary | ICD-10-CM

## 2019-10-31 DIAGNOSIS — I10 Essential (primary) hypertension: Secondary | ICD-10-CM

## 2019-10-31 DIAGNOSIS — E559 Vitamin D deficiency, unspecified: Secondary | ICD-10-CM

## 2019-10-31 DIAGNOSIS — F339 Major depressive disorder, recurrent, unspecified: Secondary | ICD-10-CM | POA: Diagnosis not present

## 2019-10-31 MED ORDER — BUPROPION HCL ER (SR) 100 MG PO TB12: 100.0000 mg | ORAL_TABLET | Freq: Every day | ORAL | 0 refills | Status: DC

## 2019-10-31 MED ORDER — VARDENAFIL HCL 20 MG PO TABS: 20.0000 mg | ORAL_TABLET | Freq: Every day | ORAL | 0 refills | Status: DC | PRN

## 2019-10-31 NOTE — Progress Notes (Signed)
[facility] [place], [place] [address]  Internal MEDICINE  Office Visit Note  Patient Name: [name]  [id number]  [id number]  Date of Service: 11/16/2019  Chief Complaint  Patient presents with  . Hypertension  . Follow-up    HPI  PT is here for follow up on HTN, depression, Vit D deficienty, and ED.  He reports he is doing well overall.  Denies any new or worsening symptoms. his blood pressure is well controlled. He would consider trying medication for his depression.        Current Medication: Outpatient Encounter Medications as of 10/31/2019  Medication Sig  . amLODipine-benazepril (LOTREL) 10-20 MG capsule Take 1 capsule by mouth daily.  Marland Kitchen aspirin 81 MG chewable tablet Chew 81 mg by mouth daily.  . Fluoxetine HCl, PMDD, 20 MG TABS Take 3 tablets (60 mg total) by mouth daily.  . [DISCONTINUED] albuterol (PROAIR HFA) 108 (90 Base) MCG/ACT inhaler Inhale 1 puff into the lungs 4 (four) times daily.  . [DISCONTINUED] FLUARIX QUADRIVALENT 0.5 ML injection inject 0.5 milliliter intramuscularly  . [DISCONTINUED] fluticasone (FLONASE) 50 MCG/ACT nasal spray Place 1 spray into both nostrils daily.  Marland Kitchen buPROPion (WELLBUTRIN SR) 100 MG 12 hr tablet Take 1 tablet (100 mg total) by mouth daily.  . vardenafil (LEVITRA) 20 MG tablet Take 1 tablet (20 mg total) by mouth daily as needed for erectile dysfunction.   No facility-administered encounter medications on file as of 10/31/2019.    Surgical History: Past Surgical History:  Procedure Laterality Date  . PROSTATE SURGERY    . REPLACEMENT TOTAL KNEE Right   . TONSILLECTOMY Bilateral     Medical History: Past Medical History:  Diagnosis Date  . Cancer [facility])    prostate and skin  . Hypertension   . Low testosterone   . Sleep apnea     Family History: Family History  Problem Relation Age of Onset  . ALS Mother   . Cancer Father   . Cancer Paternal Grandfather     Social History    Socioeconomic History  . Marital status: Married    Spouse name: Not on file  . Number of children: Not on file  . Years of education: Not on file  . Highest education level: Not on file  Occupational History  . Not on file  Tobacco Use  . Smoking status: Former Research scientist (life sciences)  . Smokeless tobacco: Former Systems developer    Types: Chew  Substance and Sexual Activity  . Alcohol use: Yes    Comment: social  . Drug use: No  . Sexual activity: Not on file  Other Topics Concern  . Not on file  Social History Narrative  . Not on file   Social Determinants of Health   Financial Resource Strain:   . Difficulty of Paying Living Expenses:   Food Insecurity:   . Worried About Charity fundraiser in the Last Year:   . Arboriculturist in the Last Year:   Transportation Needs:   . Film/video editor (Medical):   Marland Kitchen Lack of Transportation (Non-Medical):   Physical Activity:   . Days of Exercise per Week:   . Minutes of Exercise per Session:   Stress:   . Feeling of Stress :   Social Connections:   . Frequency of Communication with Friends and Family:   . Frequency of Social Gatherings with Friends and Family:   . Attends Religious Services:   . Active Member of Clubs  or Organizations:   . Attends Archivist Meetings:   Marland Kitchen Marital Status:   Intimate Partner Violence:   . Fear of Current or Ex-Partner:   . Emotionally Abused:   Marland Kitchen Physically Abused:   . Sexually Abused:       Review of Systems  Constitutional: Negative.  Negative for chills, fatigue and unexpected weight change.  HENT: Negative.  Negative for congestion, rhinorrhea, sneezing and sore throat.   Eyes: Negative for redness.  Respiratory: Negative.  Negative for cough, chest tightness and shortness of breath.   Cardiovascular: Negative.  Negative for chest pain and palpitations.  Gastrointestinal: Negative.  Negative for abdominal pain, constipation, diarrhea, nausea and vomiting.  Endocrine: Negative.    Genitourinary: Negative.  Negative for dysuria and frequency.  Musculoskeletal: Negative.  Negative for arthralgias, back pain, joint swelling and neck pain.  Skin: Negative.  Negative for rash.  Allergic/Immunologic: Negative.   Neurological: Negative.  Negative for tremors and numbness.  Hematological: Negative for adenopathy. Does not bruise/bleed easily.  Psychiatric/Behavioral: Negative.  Negative for behavioral problems, sleep disturbance and suicidal ideas. The patient is not nervous/anxious.     Vital Signs: BP (!) 143/87   Pulse 77   Temp (!) 96.2 F (35.7 C)   Resp 16   Ht 6\' 2"  (1.88 m)   Wt 276 lb (125.2 kg)   SpO2 96%   BMI 35.44 kg/m    Physical Exam Vitals and nursing note reviewed.  Constitutional:      General: He is not in acute distress.    Appearance: He is well-developed. He is not diaphoretic.  HENT:     Head: Normocephalic and atraumatic.     Mouth/Throat:     Pharynx: No oropharyngeal exudate.  Eyes:     Pupils: Pupils are equal, round, and reactive to light.  Neck:     Thyroid: No thyromegaly.     Vascular: No JVD.     Trachea: No tracheal deviation.  Cardiovascular:     Rate and Rhythm: Normal rate and regular rhythm.     Heart sounds: Normal heart sounds. No murmur. No friction rub. No gallop.   Pulmonary:     Effort: Pulmonary effort is normal. No respiratory distress.     Breath sounds: Normal breath sounds. No wheezing or rales.  Chest:     Chest wall: No tenderness.  Abdominal:     Palpations: Abdomen is soft.     Tenderness: There is no abdominal tenderness. There is no guarding.  Musculoskeletal:        General: Normal range of motion.     Cervical back: Normal range of motion and neck supple.  Lymphadenopathy:     Cervical: No cervical adenopathy.  Skin:    General: Skin is warm and dry.  Neurological:     Mental Status: He is alert and oriented to person, place, and time.     Cranial Nerves: No cranial nerve deficit.   Psychiatric:        Behavior: Behavior normal.        Thought Content: Thought content normal.        Judgment: Judgment normal.    Assessment/Plan:  1. Depression, recurrent ([place]) Add wellbutrin, start 1/2 tab for one week. Then increase to whole tab.  - buPROPion (WELLBUTRIN SR) 100 MG 12 hr tablet; Take 1 tablet (100 mg total) by mouth daily.  Dispense: 30 tablet; Refill: 0  2. Vitamin D deficiency Use otc Vit d as discussed.  3. Essential hypertension Controlled, continue current management.   4. ED (erectile dysfunction) of organic origin Use levitra as discussed.   General Counseling: meron bostrom understanding of the findings of todays visit and agrees with plan of treatment. I have discussed any further diagnostic evaluation that may be needed or ordered today. We also reviewed his medications today. he has been encouraged to call the office with any questions or concerns that should arise related to todays visit.    No orders of the defined types were placed in this encounter.   Meds ordered this encounter  Medications  . buPROPion (WELLBUTRIN SR) 100 MG 12 hr tablet    Sig: Take 1 tablet (100 mg total) by mouth daily.    Dispense:  30 tablet    Refill:  0  . vardenafil (LEVITRA) 20 MG tablet    Sig: Take 1 tablet (20 mg total) by mouth daily as needed for erectile dysfunction.    Dispense:  10 tablet    Refill:  0    Time spent: 30 Minutes   This patient was seen by Orson Gear AGNP-C in Collaboration with Dr Lavera Guise as a part of collaborative care agreement     Kendell Bane AGNP-C Internal medicine

## 2019-11-22 ENCOUNTER — Other Ambulatory Visit: Payer: Self-pay

## 2019-11-22 DIAGNOSIS — F339 Major depressive disorder, recurrent, unspecified: Secondary | ICD-10-CM

## 2019-11-22 MED ORDER — BUPROPION HCL ER (SR) 100 MG PO TB12: 100.0000 mg | ORAL_TABLET | Freq: Every day | ORAL | 0 refills | Status: DC

## 2019-11-26 ENCOUNTER — Telehealth: Payer: Self-pay

## 2019-11-26 ENCOUNTER — Other Ambulatory Visit: Payer: Self-pay

## 2019-11-26 DIAGNOSIS — F339 Major depressive disorder, recurrent, unspecified: Secondary | ICD-10-CM

## 2019-11-26 MED ORDER — BUPROPION HCL ER (SR) 100 MG PO TB12: 100.0000 mg | ORAL_TABLET | Freq: Every day | ORAL | 0 refills | Status: DC

## 2019-11-26 NOTE — Telephone Encounter (Signed)
Called lmom informing patient of virtual appointment on 11/28/2019. klh

## 2019-11-28 ENCOUNTER — Encounter: Payer: Self-pay | Admitting: Adult Health

## 2019-11-28 ENCOUNTER — Ambulatory Visit: Payer: 59 | Admitting: Adult Health

## 2019-11-28 VITALS — BP 137/84 | Resp 16 | Ht 74.0 in | Wt 276.0 lb

## 2019-11-28 DIAGNOSIS — I10 Essential (primary) hypertension: Secondary | ICD-10-CM

## 2019-11-28 DIAGNOSIS — E559 Vitamin D deficiency, unspecified: Secondary | ICD-10-CM

## 2019-11-28 DIAGNOSIS — F339 Major depressive disorder, recurrent, unspecified: Secondary | ICD-10-CM

## 2019-11-28 MED ORDER — BUPROPION HCL ER (SR) 100 MG PO TB12: 100.0000 mg | ORAL_TABLET | Freq: Every day | ORAL | 1 refills | Status: DC

## 2019-11-28 NOTE — Progress Notes (Signed)
[facility] [place], [place] [address]  Internal MEDICINE  Telephone Visit  Patient Name: [name]  [id number]  [id number]  Date of Service: 12/23/2019  I connected with the patient at 145 by telephone and verified the patients identity using two identifiers.   I discussed the limitations, risks, security and privacy concerns of performing an evaluation and management service by telephone and the availability of in person appointments. I also discussed with the patient that there may be a patient responsible charge related to the service.  The patient expressed understanding and agrees to proceed.    Chief Complaint  Patient presents with  . Follow-up  . Hypertension    HPI  Pt seen via telephone for follow up on depression HTN. At last visit Wellbutrin was added to his medications for depression.  He reports he feels better.  He reports his attitude has been elevated.      Current Medication: Outpatient Encounter Medications as of 11/28/2019  Medication Sig  . aspirin 81 MG chewable tablet Chew 81 mg by mouth daily.  . Fluoxetine HCl, PMDD, 20 MG TABS Take 3 tablets (60 mg total) by mouth daily.  . [DISCONTINUED] amLODipine-benazepril (LOTREL) 10-20 MG capsule Take 1 capsule by mouth daily.  . [DISCONTINUED] buPROPion (WELLBUTRIN SR) 100 MG 12 hr tablet Take 1 tablet (100 mg total) by mouth daily.  . [DISCONTINUED] buPROPion (WELLBUTRIN SR) 100 MG 12 hr tablet Take 1 tablet (100 mg total) by mouth daily.  . vardenafil (LEVITRA) 20 MG tablet Take 1 tablet (20 mg total) by mouth daily as needed for erectile dysfunction.   No facility-administered encounter medications on file as of 11/28/2019.    Surgical History: Past Surgical History:  Procedure Laterality Date  . PROSTATE SURGERY    . REPLACEMENT TOTAL KNEE Right   . TONSILLECTOMY Bilateral     Medical History: Past Medical History:  Diagnosis Date  . Cancer [facility])    prostate and skin   . Hypertension   . Low testosterone   . Sleep apnea     Family History: Family History  Problem Relation Age of Onset  . ALS Mother   . Cancer Father   . Cancer Paternal Grandfather     Social History   Socioeconomic History  . Marital status: Married    Spouse name: Not on file  . Number of children: Not on file  . Years of education: Not on file  . Highest education level: Not on file  Occupational History  . Not on file  Tobacco Use  . Smoking status: Former Research scientist (life sciences)  . Smokeless tobacco: Former Systems developer    Types: Chew  Substance and Sexual Activity  . Alcohol use: Yes    Comment: social  . Drug use: No  . Sexual activity: Not on file  Other Topics Concern  . Not on file  Social History Narrative  . Not on file   Social Determinants of Health   Financial Resource Strain:   . Difficulty of Paying Living Expenses:   Food Insecurity:   . Worried About Charity fundraiser in the Last Year:   . Arboriculturist in the Last Year:   Transportation Needs:   . Film/video editor (Medical):   Marland Kitchen Lack of Transportation (Non-Medical):   Physical Activity:   . Days of Exercise per Week:   . Minutes of Exercise per Session:   Stress:   . Feeling of Stress :  Social Connections:   . Frequency of Communication with Friends and Family:   . Frequency of Social Gatherings with Friends and Family:   . Attends Religious Services:   . Active Member of Clubs or Organizations:   . Attends Archivist Meetings:   Marland Kitchen Marital Status:   Intimate Partner Violence:   . Fear of Current or Ex-Partner:   . Emotionally Abused:   Marland Kitchen Physically Abused:   . Sexually Abused:       Review of Systems  Constitutional: Negative.  Negative for chills, fatigue and unexpected weight change.  HENT: Negative.  Negative for congestion, rhinorrhea, sneezing and sore throat.   Eyes: Negative for redness.  Respiratory: Negative.  Negative for cough, chest tightness and shortness of  breath.   Cardiovascular: Negative.  Negative for chest pain and palpitations.  Gastrointestinal: Negative.  Negative for abdominal pain, constipation, diarrhea, nausea and vomiting.  Endocrine: Negative.   Genitourinary: Negative.  Negative for dysuria and frequency.  Musculoskeletal: Negative.  Negative for arthralgias, back pain, joint swelling and neck pain.  Skin: Negative.  Negative for rash.  Allergic/Immunologic: Negative.   Neurological: Negative.  Negative for tremors and numbness.  Hematological: Negative for adenopathy. Does not bruise/bleed easily.  Psychiatric/Behavioral: Negative.  Negative for behavioral problems, sleep disturbance and suicidal ideas. The patient is not nervous/anxious.     Vital Signs: BP 137/84   Resp 16   Ht 6\' 2"  (1.88 m)   Wt 276 lb (125.2 kg)   BMI 35.44 kg/m    Observation/Objective:  Well sounding, NAD noted.    Assessment/Plan: 1. Depression, recurrent ([place]) Doing well with Wellbutrin, continue as prescribed.  - buPROPion (WELLBUTRIN SR) 100 MG 12 hr tablet; Take 1 tablet (100 mg total) by mouth daily.  Dispense: 90 tablet; Refill: 1  2. Essential hypertension Stable,continue current management.   3. Vitamin D deficiency Continue to monitor.   General Counseling: rodel glaspy understanding of the findings of today's phone visit and agrees with plan of treatment. I have discussed any further diagnostic evaluation that may be needed or ordered today. We also reviewed his medications today. he has been encouraged to call the office with any questions or concerns that should arise related to todays visit.    No orders of the defined types were placed in this encounter.   Meds ordered this encounter  Medications  . DISCONTD: buPROPion (WELLBUTRIN SR) 100 MG 12 hr tablet    Sig: Take 1 tablet (100 mg total) by mouth daily.    Dispense:  90 tablet    Refill:  1    Time spent: Carlos AGNP-C Internal  medicine

## 2019-12-05 ENCOUNTER — Other Ambulatory Visit: Payer: Self-pay

## 2019-12-05 DIAGNOSIS — I10 Essential (primary) hypertension: Secondary | ICD-10-CM

## 2019-12-05 MED ORDER — AMLODIPINE BESY-BENAZEPRIL HCL 10-20 MG PO CAPS: 1.0000 | ORAL_CAPSULE | Freq: Every day | ORAL | 3 refills | Status: DC

## 2019-12-23 ENCOUNTER — Other Ambulatory Visit: Payer: Self-pay

## 2019-12-23 DIAGNOSIS — F339 Major depressive disorder, recurrent, unspecified: Secondary | ICD-10-CM

## 2019-12-23 MED ORDER — BUPROPION HCL ER (SR) 100 MG PO TB12: 100.0000 mg | ORAL_TABLET | Freq: Every day | ORAL | 1 refills | Status: DC

## 2020-01-29 ENCOUNTER — Other Ambulatory Visit: Payer: Self-pay

## 2020-01-29 DIAGNOSIS — F339 Major depressive disorder, recurrent, unspecified: Secondary | ICD-10-CM

## 2020-01-29 MED ORDER — FLUOXETINE HCL (PMDD) 20 MG PO TABS: 3.0000 | ORAL_TABLET | Freq: Every day | ORAL | 1 refills | Status: DC

## 2020-02-25 ENCOUNTER — Telehealth: Payer: Self-pay

## 2020-02-25 NOTE — Telephone Encounter (Signed)
Lmom to confirm and screen for 02-27-20 ov.

## 2020-02-27 ENCOUNTER — Encounter: Payer: Self-pay | Admitting: Hospice and Palliative Medicine

## 2020-02-27 ENCOUNTER — Other Ambulatory Visit: Payer: Self-pay

## 2020-02-27 ENCOUNTER — Ambulatory Visit (INDEPENDENT_AMBULATORY_CARE_PROVIDER_SITE_OTHER): Payer: 59 | Admitting: Hospice and Palliative Medicine

## 2020-02-27 DIAGNOSIS — J302 Other seasonal allergic rhinitis: Secondary | ICD-10-CM

## 2020-02-27 DIAGNOSIS — G4733 Obstructive sleep apnea (adult) (pediatric): Secondary | ICD-10-CM

## 2020-02-27 DIAGNOSIS — R300 Dysuria: Secondary | ICD-10-CM | POA: Diagnosis not present

## 2020-02-27 DIAGNOSIS — Z9989 Dependence on other enabling machines and devices: Secondary | ICD-10-CM

## 2020-02-27 DIAGNOSIS — Z0001 Encounter for general adult medical examination with abnormal findings: Secondary | ICD-10-CM

## 2020-02-27 DIAGNOSIS — I10 Essential (primary) hypertension: Secondary | ICD-10-CM | POA: Diagnosis not present

## 2020-02-27 DIAGNOSIS — Z23 Encounter for immunization: Secondary | ICD-10-CM

## 2020-02-27 MED ORDER — AMLODIPINE BESY-BENAZEPRIL HCL 10-20 MG PO CAPS: 1.0000 | ORAL_CAPSULE | Freq: Every day | ORAL | 3 refills | Status: DC

## 2020-02-27 MED ORDER — FLUTICASONE PROPIONATE 50 MCG/ACT NA SUSP: 2.0000 | Freq: Every day | NASAL | 6 refills | Status: DC

## 2020-02-27 MED ORDER — LORATADINE 10 MG PO TABS: 10.0000 mg | ORAL_TABLET | Freq: Every day | ORAL | 11 refills | Status: DC

## 2020-02-27 NOTE — Progress Notes (Signed)
[facility] [place], [place] [address]  Internal MEDICINE  Office Visit Note  Patient Name: [name]  [id number]  [id number]  Date of Service: 03/02/2020  Chief Complaint  Patient presents with  . Annual Exam    hacking cough for a 2 weeks, refill request  . Hypertension  . Quality Metric Gaps    HepC     HPI Pt is here for routine health maintenance examination Overall, he feels as though his health is well, he knows he is overweight and understands the importance of diet and exercise but finds it difficult to focus on these things when his life is so busy Ran out of current dose of BP meds, was taking lower dose for over a month He complains that he thinks he may be dealing with allergies as he has had a dry cough, sneezing as well as sinus congestion for about 2 weeks OSA-continues to wear his CPAP nightly--will need to set up pulmonary to follow his OSA and CPAP Depression-he was recently started on Wellbutrin and has noticed much improvement in his moods, he feels this has also helped with his anxiety, he has a stressful job Architectural technologist but is able to control and manage his stress and anxiety better--he is looking forward to retirement soon in the next year or so    Current Medication: Outpatient Encounter Medications as of 02/27/2020  Medication Sig  . amLODipine-benazepril (LOTREL) 10-20 MG capsule Take 1 capsule by mouth daily.  Marland Kitchen aspirin 81 MG chewable tablet Chew 81 mg by mouth daily.  Marland Kitchen buPROPion (WELLBUTRIN SR) 100 MG 12 hr tablet Take 1 tablet (100 mg total) by mouth daily.  . Fluoxetine HCl, PMDD, 20 MG TABS Take 3 tablets (60 mg total) by mouth daily.  . [DISCONTINUED] amLODipine-benazepril (LOTREL) 10-20 MG capsule Take 1 capsule by mouth daily.  . [DISCONTINUED] amLODipine-benazepril (LOTREL) 10-20 MG capsule Take 1 capsule by mouth daily.  . fluticasone (FLONASE) 50 MCG/ACT nasal spray Place 2 sprays into both nostrils  daily.  Marland Kitchen loratadine (CLARITIN) 10 MG tablet Take 1 tablet (10 mg total) by mouth daily.  . [DISCONTINUED] vardenafil (LEVITRA) 20 MG tablet Take 1 tablet (20 mg total) by mouth daily as needed for erectile dysfunction.   No facility-administered encounter medications on file as of 02/27/2020.    Surgical History: Past Surgical History:  Procedure Laterality Date  . PROSTATE SURGERY    . REPLACEMENT TOTAL KNEE Right   . TONSILLECTOMY Bilateral     Medical History: Past Medical History:  Diagnosis Date  . Cancer [facility])    prostate and skin  . Hypertension   . Low testosterone   . Sleep apnea     Family History: Family History  Problem Relation Age of Onset  . ALS Mother   . Cancer Father   . Cancer Paternal Grandfather     Review of Systems  Constitutional: Negative for chills, fatigue and unexpected weight change.  HENT: Positive for congestion, postnasal drip, rhinorrhea and sneezing. Negative for sore throat.   Eyes: Negative for photophobia, redness and visual disturbance.  Respiratory: Positive for cough. Negative for chest tightness and shortness of breath.   Cardiovascular: Negative for chest pain, palpitations and leg swelling.  Gastrointestinal: Negative for abdominal pain, constipation, diarrhea, nausea and vomiting.  Genitourinary: Negative for dysuria and frequency.  Musculoskeletal: Negative for arthralgias, back pain, joint swelling and neck pain.  Skin: Negative for rash.  Neurological: Negative for dizziness, tremors, weakness, numbness  and headaches.  Hematological: Negative for adenopathy. Does not bruise/bleed easily.  Psychiatric/Behavioral: Negative for behavioral problems (Depression), sleep disturbance and suicidal ideas. The patient is not nervous/anxious.     Vital Signs: BP (!) 141/91   Pulse 85   Temp 97.8 F (36.6 C)   Resp 16   Ht 6\' 2"  (1.88 m)   Wt 275 lb 6.4 oz (124.9 kg)   SpO2 93%   BMI 35.36 kg/m    Physical Exam Vitals  reviewed.  Constitutional:      Appearance: Normal appearance.  HENT:     Nose: Nose normal.     Mouth/Throat:     Mouth: Mucous membranes are moist.     Pharynx: Oropharynx is clear.  Cardiovascular:     Rate and Rhythm: Normal rate and regular rhythm.     Pulses: Normal pulses.     Heart sounds: Normal heart sounds.  Pulmonary:     Effort: Pulmonary effort is normal.     Breath sounds: Normal breath sounds.  Abdominal:     General: Abdomen is flat.     Palpations: Abdomen is soft.  Musculoskeletal:        General: Normal range of motion.     Cervical back: Normal range of motion.  Skin:    General: Skin is warm.  Neurological:     General: No focal deficit present.     Mental Status: He is alert and oriented to person, place, and time. Mental status is at baseline.  Psychiatric:        Mood and Affect: Mood normal.        Behavior: Behavior normal.        Thought Content: Thought content normal.    LABS: No results found for this or any previous visit (from the past 2160 hour(s)).  Assessment/Plan: 1. Encounter for routine adult health examination with abnormal findings Well appearing 64 year old male, up to date on PHM, ColoGuard testing done May 2021, negative results. Will obtain annual labs for review. - CBC w/Diff/Platelet - Comprehensive Metabolic Panel (CMET) - Lipid Panel With LDL/HDL Ratio - TSH + free T4 - PSA  2. Essential hypertension BP elevated today, HR stable. Has been taking his past prescribed dose of medication amlodipine-benazepril 5-20 mg as he ran out of his current dose, has been taking lowered dose for about a month. Refilled higher dose of medication and will reassess BP response at next follow-up visit. - amLODipine-benazepril (LOTREL) 10-20 MG capsule; Take 1 capsule by mouth daily.  Dispense: 90 capsule; Refill: 3  3. Seasonal allergies Due to mild symptoms will start with daily loratadine as well as Flonase nasal spray to help manage  allergy symptoms. May benefit from allergy testing if symptoms persist or worsen. - fluticasone (FLONASE) 50 MCG/ACT nasal spray; Place 2 sprays into both nostrils daily.  Dispense: 16 g; Refill: 6 - loratadine (CLARITIN) 10 MG tablet; Take 1 tablet (10 mg total) by mouth daily.  Dispense: 30 tablet; Refill: 11  4. OSA on CPAP Continue with nightly compliance with CPAP. Will need to be set up for compliance download and follow-up with pulmonary.  5. Flu vaccine need - Flu Vaccine MDCK QUAD PF  General Counseling: Kaven verbalizes understanding of the findings of todays visit and agrees with plan of treatment. I have discussed any further diagnostic evaluation that may be needed or ordered today. We also reviewed his medications today. he has been encouraged to call the office with any questions  or concerns that should arise related to todays visit.    Counseling:    Orders Placed This Encounter  Procedures  . Flu Vaccine MDCK QUAD PF  . CBC w/Diff/Platelet  . Comprehensive Metabolic Panel (CMET)  . Lipid Panel With LDL/HDL Ratio  . TSH + free T4  . PSA    Meds ordered this encounter  Medications  . DISCONTD: amLODipine-benazepril (LOTREL) 10-20 MG capsule    Sig: Take 1 capsule by mouth daily.    Dispense:  30 capsule    Refill:  3  . fluticasone (FLONASE) 50 MCG/ACT nasal spray    Sig: Place 2 sprays into both nostrils daily.    Dispense:  16 g    Refill:  6  . loratadine (CLARITIN) 10 MG tablet    Sig: Take 1 tablet (10 mg total) by mouth daily.    Dispense:  30 tablet    Refill:  11  . amLODipine-benazepril (LOTREL) 10-20 MG capsule    Sig: Take 1 capsule by mouth daily.    Dispense:  90 capsule    Refill:  3    Total time spent: 35 Minutes  Time spent includes review of chart, medications, test results, and follow up plan with the patient.   This patient was seen by Casey Burkitt AGNP-C Collaboration with Dr Lavera Guise as a part of collaborative care  agreement   Tanna Furry. [facility] Internal Medicine

## 2020-03-02 ENCOUNTER — Encounter: Payer: Self-pay | Admitting: Hospice and Palliative Medicine

## 2020-03-04 ENCOUNTER — Ambulatory Visit: Payer: 59

## 2020-03-04 ENCOUNTER — Other Ambulatory Visit: Payer: Self-pay

## 2020-03-04 DIAGNOSIS — G4733 Obstructive sleep apnea (adult) (pediatric): Secondary | ICD-10-CM

## 2020-03-04 NOTE — Progress Notes (Signed)
His cpap can not be downloaded. He would like to see about getting a new cpap. I will speak to taylor about it.

## 2020-03-16 ENCOUNTER — Encounter: Payer: Self-pay | Admitting: Hospice and Palliative Medicine

## 2020-03-16 ENCOUNTER — Telehealth: Payer: Self-pay

## 2020-03-16 NOTE — Telephone Encounter (Signed)
PT called in stating he needed to cancel his appt for 10-14 as he has not received his cpap machine yet

## 2020-03-16 NOTE — Telephone Encounter (Signed)
Hey. This message cam in my box, but addressed to you.

## 2020-03-19 ENCOUNTER — Ambulatory Visit: Payer: 59 | Admitting: Internal Medicine

## 2020-03-20 ENCOUNTER — Telehealth: Payer: Self-pay

## 2020-03-20 NOTE — Telephone Encounter (Signed)
Gave [facility] orders for new cpap that who has did his studies previous years. Niranjan Rufener

## 2020-03-24 ENCOUNTER — Telehealth: Payer: Self-pay

## 2020-03-24 NOTE — Telephone Encounter (Signed)
Gave [facility] orders for new cpap machine, he has always used this dme company. Margaree Sandhu

## 2020-04-09 ENCOUNTER — Ambulatory Visit: Payer: 59 | Admitting: Hospice and Palliative Medicine

## 2020-04-18 LAB — COMPREHENSIVE METABOLIC PANEL
ALT: 24 IU/L (ref 0–44)
AST: 22 IU/L (ref 0–40)
Albumin/Globulin Ratio: 2.1 (ref 1.2–2.2)
Albumin: 4.4 g/dL (ref 3.8–4.8)
Alkaline Phosphatase: 86 IU/L (ref 44–121)
BUN/Creatinine Ratio: 14 (ref 10–24)
BUN: 17 mg/dL (ref 8–27)
Bilirubin Total: 0.6 mg/dL (ref 0.0–1.2)
CO2: 23 mmol/L (ref 20–29)
Calcium: 9.6 mg/dL (ref 8.6–10.2)
Chloride: 104 mmol/L (ref 96–106)
Creatinine, Ser: 1.21 mg/dL (ref 0.76–1.27)
GFR calc Af Amer: 73 mL/min/{1.73_m2} (ref >59)
GFR calc non Af Amer: 63 mL/min/{1.73_m2} (ref >59)
Globulin, Total: 2.1 g/dL (ref 1.5–4.5)
Glucose: 114 mg/dL — ABNORMAL HIGH (ref 65–99)
Potassium: 4.7 mmol/L (ref 3.5–5.2)
Sodium: 142 mmol/L (ref 134–144)
Total Protein: 6.5 g/dL (ref 6.0–8.5)

## 2020-04-18 LAB — CBC WITH DIFFERENTIAL/PLATELET
Basophils Absolute: 0.1 10*3/uL (ref 0.0–0.2)
Basos: 1 %
EOS (ABSOLUTE): 0.4 10*3/uL (ref 0.0–0.4)
Eos: 5 %
Hematocrit: 45.5 % (ref 37.5–51.0)
Hemoglobin: 15.3 g/dL (ref 13.0–17.7)
Immature Grans (Abs): 0 10*3/uL (ref 0.0–0.1)
Immature Granulocytes: 0 %
Lymphocytes Absolute: 2 10*3/uL (ref 0.7–3.1)
Lymphs: 26 %
MCH: 28.9 pg (ref 26.6–33.0)
MCHC: 33.6 g/dL (ref 31.5–35.7)
MCV: 86 fL (ref 79–97)
Monocytes Absolute: 0.7 10*3/uL (ref 0.1–0.9)
Monocytes: 9 %
Neutrophils Absolute: 4.5 10*3/uL (ref 1.4–7.0)
Neutrophils: 59 %
Platelets: 283 10*3/uL (ref 150–450)
RBC: 5.29 x10E6/uL (ref 4.14–5.80)
RDW: 13.3 % (ref 11.6–15.4)
WBC: 7.7 10*3/uL (ref 3.4–10.8)

## 2020-04-18 LAB — LIPID PANEL WITH LDL/HDL RATIO
Cholesterol, Total: 166 mg/dL (ref 100–199)
HDL: 35 mg/dL — ABNORMAL LOW (ref >39)
LDL Chol Calc (NIH): 109 mg/dL — ABNORMAL HIGH (ref 0–99)
LDL/HDL Ratio: 3.1 ratio (ref 0.0–3.6)
Triglycerides: 119 mg/dL (ref 0–149)
VLDL Cholesterol Cal: 22 mg/dL (ref 5–40)

## 2020-04-18 LAB — TSH+FREE T4
Free T4: 1.06 ng/dL (ref 0.82–1.77)
TSH: 3.7 u[IU]/mL (ref 0.450–4.500)

## 2020-04-18 LAB — PSA: Prostate Specific Ag, Serum: <0.1 ng/mL (ref 0.0–4.0)

## 2020-04-23 ENCOUNTER — Encounter: Payer: Self-pay | Admitting: Hospice and Palliative Medicine

## 2020-04-23 ENCOUNTER — Ambulatory Visit: Payer: 59 | Admitting: Internal Medicine

## 2020-04-23 ENCOUNTER — Other Ambulatory Visit: Payer: Self-pay

## 2020-04-23 DIAGNOSIS — Z9989 Dependence on other enabling machines and devices: Secondary | ICD-10-CM

## 2020-04-23 DIAGNOSIS — E1165 Type 2 diabetes mellitus with hyperglycemia: Secondary | ICD-10-CM | POA: Diagnosis not present

## 2020-04-23 DIAGNOSIS — G4733 Obstructive sleep apnea (adult) (pediatric): Secondary | ICD-10-CM | POA: Diagnosis not present

## 2020-04-23 DIAGNOSIS — I10 Essential (primary) hypertension: Secondary | ICD-10-CM | POA: Diagnosis not present

## 2020-04-23 DIAGNOSIS — R7301 Impaired fasting glucose: Secondary | ICD-10-CM | POA: Diagnosis not present

## 2020-04-23 DIAGNOSIS — F3341 Major depressive disorder, recurrent, in partial remission: Secondary | ICD-10-CM

## 2020-04-23 DIAGNOSIS — Z6835 Body mass index (BMI) 35.0-35.9, adult: Secondary | ICD-10-CM

## 2020-04-23 LAB — POCT GLYCOSYLATED HEMOGLOBIN (HGB A1C): Hemoglobin A1C: 5.9 % — AB (ref 4.0–5.6)

## 2020-04-23 NOTE — Progress Notes (Signed)
[facility] [place],  [address]  Internal MEDICINE  Office Visit Note  Patient Name: [name]  [id number]  [id number]  Date of Service: 04/24/2020  Chief Complaint  Patient presents with  . Follow-up    review lab work  . Sleep Apnea  . Hypertension  . policy update form    received    HPI Pt is here for routine follow up. Feels well however has gained wt over period of time. His CPAP machine is 65 years old, downloads and pressures have not been updated and not current. Blood pressure is also elevated. CBG is high as well. He is active at his work. Denies any chest pain or sob  His LDL is slightly elevated   Current Medication: Outpatient Encounter Medications as of 04/23/2020  Medication Sig  . amLODipine-benazepril (LOTREL) 10-20 MG capsule Take 1 capsule by mouth daily.  Marland Kitchen aspirin 81 MG chewable tablet Chew 81 mg by mouth daily.  Marland Kitchen buPROPion (WELLBUTRIN SR) 100 MG 12 hr tablet Take 1 tablet (100 mg total) by mouth daily.  . Fluoxetine HCl, PMDD, 20 MG TABS Take 3 tablets (60 mg total) by mouth daily.  . fluticasone (FLONASE) 50 MCG/ACT nasal spray Place 2 sprays into both nostrils daily.  Marland Kitchen loratadine (CLARITIN) 10 MG tablet Take 1 tablet (10 mg total) by mouth daily.   No facility-administered encounter medications on file as of 04/23/2020.    Surgical History: Past Surgical History:  Procedure Laterality Date  . PROSTATE SURGERY    . REPLACEMENT TOTAL KNEE Right   . TONSILLECTOMY Bilateral     Medical History: Past Medical History:  Diagnosis Date  . Cancer [facility])    prostate and skin  . Hypertension   . Low testosterone   . Sleep apnea     Family History: Family History  Problem Relation Age of Onset  . ALS Mother   . Cancer Father   . Cancer Paternal Grandfather     Social History   Socioeconomic History  . Marital status: Married    Spouse name: Not on file  . Number of children: Not on file  . Years  of education: Not on file  . Highest education level: Not on file  Occupational History  . Not on file  Tobacco Use  . Smoking status: Former Research scientist (life sciences)  . Smokeless tobacco: Former Systems developer    Types: Chew  Substance and Sexual Activity  . Alcohol use: Yes    Comment: social  . Drug use: No  . Sexual activity: Not on file  Other Topics Concern  . Not on file  Social History Narrative  . Not on file   Social Determinants of Health   Financial Resource Strain:   . Difficulty of Paying Living Expenses: Not on file  Food Insecurity:   . Worried About Charity fundraiser in the Last Year: Not on file  . Ran Out of Food in the Last Year: Not on file  Transportation Needs:   . Lack of Transportation (Medical): Not on file  . Lack of Transportation (Non-Medical): Not on file  Physical Activity:   . Days of Exercise per Week: Not on file  . Minutes of Exercise per Session: Not on file  Stress:   . Feeling of Stress : Not on file  Social Connections:   . Frequency of Communication with Friends and Family: Not on file  . Frequency of Social Gatherings with Friends and Family: Not on  file  . Attends Religious Services: Not on file  . Active Member of Clubs or Organizations: Not on file  . Attends Archivist Meetings: Not on file  . Marital Status: Not on file  Intimate Partner Violence:   . Fear of Current or Ex-Partner: Not on file  . Emotionally Abused: Not on file  . Physically Abused: Not on file  . Sexually Abused: Not on file     Review of Systems  Constitutional: Negative for chills, fatigue and unexpected weight change.  HENT: Positive for postnasal drip. Negative for congestion, rhinorrhea, sneezing and sore throat.   Eyes: Negative for redness.  Respiratory: Negative for cough, chest tightness and shortness of breath.   Cardiovascular: Negative for chest pain and palpitations.       Elevated bp   Gastrointestinal: Negative for abdominal pain, constipation,  diarrhea, nausea and vomiting.  Genitourinary: Negative for dysuria and frequency.  Musculoskeletal: Negative for arthralgias, back pain, joint swelling and neck pain.  Skin: Negative for rash.  Neurological: Negative.  Negative for tremors and numbness.  Hematological: Negative for adenopathy. Does not bruise/bleed easily.  Psychiatric/Behavioral: Negative for behavioral problems (Depression), sleep disturbance and suicidal ideas. The patient is not nervous/anxious.     Vital Signs: BP (!) 146/92   Pulse 70   Temp (!) 97.5 F (36.4 C)   Resp 16   Ht 6\' 2"  (1.88 m)   Wt 279 lb 9.6 oz (126.8 kg)   SpO2 98%   BMI 35.90 kg/m    Physical Exam Constitutional:      General: He is not in acute distress.    Appearance: He is well-developed. He is not diaphoretic.  HENT:     Head: Normocephalic and atraumatic.     Mouth/Throat:     Pharynx: No oropharyngeal exudate.  Eyes:     Pupils: Pupils are equal, round, and reactive to light.  Neck:     Thyroid: No thyromegaly.     Vascular: No JVD.     Trachea: No tracheal deviation.  Cardiovascular:     Rate and Rhythm: Normal rate and regular rhythm.     Heart sounds: Normal heart sounds. No murmur heard.  No friction rub. No gallop.   Pulmonary:     Effort: Pulmonary effort is normal. No respiratory distress.     Breath sounds: No wheezing or rales.  Chest:     Chest wall: No tenderness.  Abdominal:     General: Bowel sounds are normal.     Palpations: Abdomen is soft.  Musculoskeletal:        General: Normal range of motion.     Cervical back: Normal range of motion and neck supple.  Lymphadenopathy:     Cervical: No cervical adenopathy.  Skin:    General: Skin is warm and dry.  Neurological:     Mental Status: He is alert and oriented to person, place, and time.     Cranial Nerves: No cranial nerve deficit.  Psychiatric:        Behavior: Behavior normal.        Thought Content: Thought content normal.        Judgment:  Judgment normal.        Assessment/Plan: 1. Uncontrolled type 2 diabetes mellitus with hyperglycemia (HCC) Elevated glucose, will start trulicity 7.89 mg q week, will increase as tolerated, will help with wt loss.  - POCT HgB A1C  2. OSA on CPAP Pt will be getting a new CPAP  3. Essential hypertension Wants to hold off on increase Lotrel, wants to work on losing wt   4. Recurrent major depressive disorder, in partial remission ([place]) Controlled with wellbutrin   5. BMI 35.0-35.9,adult Obesity Counseling: Risk Assessment: An assessment of behavioral risk factors was made today and includes lack of exercise sedentary lifestyle, lack of portion control and poor dietary habits.  Risk Modification Advice: She was counseled on portion control guidelines. Restricting daily caloric intake to1800 . The detrimental long term effects of obesity on her health and ongoing poor compliance was also discussed with the patient.   General Counseling: tanav orsak understanding of the findings of todays visit and agrees with plan of treatment. I have discussed any further diagnostic evaluation that may be needed or ordered today. We also reviewed his medications today. he has been encouraged to call the office with any questions or concerns that should arise related to todays visit.    Orders Placed This Encounter  Procedures  . POCT HgB A1C    No orders of the defined types were placed in this encounter.   Total time spent: 35Minutes Time spent includes review of chart, medications, test results, and follow up plan with the patient.      Dr Lavera Guise Internal medicine

## 2020-05-07 ENCOUNTER — Other Ambulatory Visit: Payer: Self-pay

## 2020-05-07 ENCOUNTER — Ambulatory Visit: Payer: 59 | Admitting: Internal Medicine

## 2020-05-07 ENCOUNTER — Encounter: Payer: Self-pay | Admitting: Internal Medicine

## 2020-05-07 DIAGNOSIS — I10 Essential (primary) hypertension: Secondary | ICD-10-CM

## 2020-05-07 DIAGNOSIS — Z6834 Body mass index (BMI) 34.0-34.9, adult: Secondary | ICD-10-CM | POA: Diagnosis not present

## 2020-05-07 DIAGNOSIS — E1165 Type 2 diabetes mellitus with hyperglycemia: Secondary | ICD-10-CM

## 2020-05-07 DIAGNOSIS — M1712 Unilateral primary osteoarthritis, left knee: Secondary | ICD-10-CM

## 2020-05-07 DIAGNOSIS — G4733 Obstructive sleep apnea (adult) (pediatric): Secondary | ICD-10-CM

## 2020-05-07 DIAGNOSIS — Z9989 Dependence on other enabling machines and devices: Secondary | ICD-10-CM

## 2020-05-07 MED ORDER — TRULICITY 0.75 MG/0.5ML ~~LOC~~ SOAJ: 0.7500 mg | SUBCUTANEOUS | 3 refills | Status: DC

## 2020-05-07 MED ORDER — AMLODIPINE BESY-BENAZEPRIL HCL 10-20 MG PO CAPS: 1.0000 | ORAL_CAPSULE | Freq: Every day | ORAL | 3 refills | Status: DC

## 2020-05-07 MED ORDER — ACCU-CHEK GUIDE VI STRP: 1.0000 | ORAL_STRIP | Freq: Every day | 1 refills | Status: DC

## 2020-05-07 NOTE — Progress Notes (Signed)
[facility] [place], [place] [address]  Internal MEDICINE  Office Visit Note  Patient Name: [name]  [id number]  [id number]  Date of Service: 05/07/2020  Chief Complaint  Patient presents with  . Follow-up    checking to see how samples have been working  . Sleep Apnea  . Hypertension  . policy update form    received    HPI     Current Medication: Outpatient Encounter Medications as of 05/07/2020  Medication Sig  . amLODipine-benazepril (LOTREL) 10-20 MG capsule Take 1 capsule by mouth daily.  Marland Kitchen aspirin 81 MG chewable tablet Chew 81 mg by mouth daily.  Marland Kitchen buPROPion (WELLBUTRIN SR) 100 MG 12 hr tablet Take 1 tablet (100 mg total) by mouth daily.  . Fluoxetine HCl, PMDD, 20 MG TABS Take 3 tablets (60 mg total) by mouth daily.  . fluticasone (FLONASE) 50 MCG/ACT nasal spray Place 2 sprays into both nostrils daily.  Marland Kitchen loratadine (CLARITIN) 10 MG tablet Take 1 tablet (10 mg total) by mouth daily.  . [DISCONTINUED] amLODipine-benazepril (LOTREL) 10-20 MG capsule Take 1 capsule by mouth daily.  . Dulaglutide (TRULICITY) 4.65 KP/5.4SF SOPN Inject 0.75 mg into the skin once a week.   No facility-administered encounter medications on file as of 05/07/2020.    Surgical History: Past Surgical History:  Procedure Laterality Date  . PROSTATE SURGERY    . REPLACEMENT TOTAL KNEE Right   . TONSILLECTOMY Bilateral     Medical History: Past Medical History:  Diagnosis Date  . Cancer [facility])    prostate and skin  . Hypertension   . Low testosterone   . Sleep apnea     Family History: Family History  Problem Relation Age of Onset  . ALS Mother   . Cancer Father   . Cancer Paternal Grandfather     Social History   Socioeconomic History  . Marital status: Married    Spouse name: Not on file  . Number of children: Not on file  . Years of education: Not on file  . Highest education level: Not on file  Occupational History  . Not on file   Tobacco Use  . Smoking status: Former Research scientist (life sciences)  . Smokeless tobacco: Former Systems developer    Types: Chew  Substance and Sexual Activity  . Alcohol use: Yes    Comment: social  . Drug use: No  . Sexual activity: Not on file  Other Topics Concern  . Not on file  Social History Narrative  . Not on file   Social Determinants of Health   Financial Resource Strain:   . Difficulty of Paying Living Expenses: Not on file  Food Insecurity:   . Worried About Charity fundraiser in the Last Year: Not on file  . Ran Out of Food in the Last Year: Not on file  Transportation Needs:   . Lack of Transportation (Medical): Not on file  . Lack of Transportation (Non-Medical): Not on file  Physical Activity:   . Days of Exercise per Week: Not on file  . Minutes of Exercise per Session: Not on file  Stress:   . Feeling of Stress : Not on file  Social Connections:   . Frequency of Communication with Friends and Family: Not on file  . Frequency of Social Gatherings with Friends and Family: Not on file  . Attends Religious Services: Not on file  . Active Member of Clubs or Organizations: Not on file  . Attends Club or  Organization Meetings: Not on file  . Marital Status: Not on file  Intimate Partner Violence:   . Fear of Current or Ex-Partner: Not on file  . Emotionally Abused: Not on file  . Physically Abused: Not on file  . Sexually Abused: Not on file      Review of Systems  Constitutional: Negative for chills, fatigue and unexpected weight change.  HENT: Negative for congestion, postnasal drip, rhinorrhea, sneezing and sore throat.   Eyes: Negative for redness.  Respiratory: Negative for cough, chest tightness and shortness of breath.   Cardiovascular: Negative for chest pain and palpitations.  Gastrointestinal: Negative for abdominal pain, constipation, diarrhea, nausea and vomiting.  Genitourinary: Negative for dysuria and frequency.  Musculoskeletal: Negative for arthralgias, back pain,  joint swelling and neck pain.       Left knee pain   Skin: Negative for rash.  Neurological: Negative.  Negative for tremors and numbness.  Hematological: Negative for adenopathy. Does not bruise/bleed easily.  Psychiatric/Behavioral: Negative for behavioral problems (Depression), sleep disturbance and suicidal ideas. The patient is not nervous/anxious.     Vital Signs: BP (!) 148/98   Pulse 75   Temp (!) 97.2 F (36.2 C)   Resp 16   Ht 6\' 2"  (1.88 m)   Wt 271 lb 12.8 oz (123.3 kg)   SpO2 98%   BMI 34.90 kg/m    Physical Exam Constitutional:      General: He is not in acute distress.    Appearance: He is well-developed. He is not diaphoretic.  HENT:     Head: Normocephalic and atraumatic.     Mouth/Throat:     Pharynx: No oropharyngeal exudate.  Eyes:     Pupils: Pupils are equal, round, and reactive to light.  Neck:     Thyroid: No thyromegaly.     Vascular: No JVD.     Trachea: No tracheal deviation.  Cardiovascular:     Rate and Rhythm: Normal rate and regular rhythm.     Heart sounds: Normal heart sounds. No murmur heard.  No friction rub. No gallop.   Pulmonary:     Effort: Pulmonary effort is normal. No respiratory distress.     Breath sounds: No wheezing or rales.  Chest:     Chest wall: No tenderness.  Abdominal:     General: Bowel sounds are normal.     Palpations: Abdomen is soft.  Musculoskeletal:        General: Normal range of motion.     Cervical back: Normal range of motion and neck supple.  Lymphadenopathy:     Cervical: No cervical adenopathy.  Skin:    General: Skin is warm and dry.  Neurological:     Mental Status: He is alert and oriented to person, place, and time.     Cranial Nerves: No cranial nerve deficit.  Psychiatric:        Behavior: Behavior normal.        Thought Content: Thought content normal.        Judgment: Judgment normal.     Assessment/Plan: 1. Uncontrolled type 2 diabetes mellitus with hyperglycemia  (HCC) Improved numbers, will continue Trulicity as before, continue to monitor his diet  - Dulaglutide (TRULICITY) 1.61 WR/6.0AV SOPN; Inject 0.75 mg into the skin once a week.  Dispense: 6 mL; Refill: 3  2. Essential hypertension BP is elevated, will monitor at home, will need echo to look at LV function, increase to Lotrel 10/40 mg if BP continues to be  elevated  - amLODipine-benazepril (LOTREL) 10-20 MG capsule; Take 1 capsule by mouth daily.  Dispense: 90 capsule; Refill: 3  3. Body mass index (BMI) 34.0-34.9, adult Lost 6 lbs since last visit, will continue to monitor   4. OSA on CPAP Adherence with CPAP is good   5. Primary osteoarthritis of left knee Pt is followed by ortho for possible TKR  General Counseling: Jon Gills understanding of the findings of todays visit and agrees with plan of treatment. I have discussed any further diagnostic evaluation that may be needed or ordered today. We also reviewed his medications today. he has been encouraged to call the office with any questions or concerns that should arise related to todays visit.   Meds ordered this encounter  Medications  . Dulaglutide (TRULICITY) 9.37 TK/2.4OX SOPN    Sig: Inject 0.75 mg into the skin once a week.    Dispense:  6 mL    Refill:  3  . amLODipine-benazepril (LOTREL) 10-20 MG capsule    Sig: Take 1 capsule by mouth daily.    Dispense:  90 capsule    Refill:  3    Total time spent:30 Minutes Time spent includes review of chart, medications, test results, and follow up plan with the patient.      Dr Lavera Guise Internal medicine

## 2020-06-10 ENCOUNTER — Telehealth: Payer: Self-pay

## 2020-06-10 NOTE — Telephone Encounter (Signed)
Spoke with patient and gave him contact information for [facility] to follow up on cpap delivery. [name]

## 2020-06-18 ENCOUNTER — Ambulatory Visit: Payer: 59 | Admitting: Internal Medicine

## 2020-06-25 ENCOUNTER — Ambulatory Visit: Payer: 59 | Admitting: Physician Assistant

## 2020-06-25 ENCOUNTER — Encounter: Payer: Self-pay | Admitting: Physician Assistant

## 2020-06-25 ENCOUNTER — Other Ambulatory Visit: Payer: Self-pay

## 2020-06-25 DIAGNOSIS — I10 Essential (primary) hypertension: Secondary | ICD-10-CM | POA: Diagnosis not present

## 2020-06-25 DIAGNOSIS — G4733 Obstructive sleep apnea (adult) (pediatric): Secondary | ICD-10-CM | POA: Diagnosis not present

## 2020-06-25 DIAGNOSIS — E669 Obesity, unspecified: Secondary | ICD-10-CM | POA: Diagnosis not present

## 2020-06-25 DIAGNOSIS — E1165 Type 2 diabetes mellitus with hyperglycemia: Secondary | ICD-10-CM

## 2020-06-25 DIAGNOSIS — Z9989 Dependence on other enabling machines and devices: Secondary | ICD-10-CM

## 2020-06-25 DIAGNOSIS — R7303 Prediabetes: Secondary | ICD-10-CM

## 2020-06-25 NOTE — Progress Notes (Signed)
[facility] [place], [place] [address]  Internal MEDICINE  Office Visit Note  Patient Name: [name]  [id number]  [id number]  Date of Service: 06/25/2020  Chief Complaint  Patient presents with  . Follow-up  . Sleep Apnea  . Hypertension  . Quality Metric Gaps    pna    HPI Returns for follow up. Everything is going well. Taking the Trulicity. Has lost 20 pounds. Sugars doing better. At home sugars running 90-110. Has changed diet significantly.  BP better at home. Elevated today, but has been ok at home. Checks it regularly. New CPAP. Doing well. No concerns today.   Current Medication: Outpatient Encounter Medications as of 06/25/2020  Medication Sig  . amLODipine-benazepril (LOTREL) 10-20 MG capsule Take 1 capsule by mouth daily.  Marland Kitchen aspirin 81 MG chewable tablet Chew 81 mg by mouth daily.  Marland Kitchen buPROPion (WELLBUTRIN SR) 100 MG 12 hr tablet Take 1 tablet (100 mg total) by mouth daily.  . Dulaglutide (TRULICITY) 1.28 NO/6.7EH SOPN Inject 0.75 mg into the skin once a week.  . Fluoxetine HCl, PMDD, 20 MG TABS Take 3 tablets (60 mg total) by mouth daily.  . fluticasone (FLONASE) 50 MCG/ACT nasal spray Place 2 sprays into both nostrils daily.  Marland Kitchen glucose blood (ACCU-CHEK GUIDE) test strip 1 each by Other route daily. Dx e11.65  . loratadine (CLARITIN) 10 MG tablet Take 1 tablet (10 mg total) by mouth daily.   No facility-administered encounter medications on file as of 06/25/2020.    Surgical History: Past Surgical History:  Procedure Laterality Date  . PROSTATE SURGERY    . REPLACEMENT TOTAL KNEE Right   . TONSILLECTOMY Bilateral     Medical History: Past Medical History:  Diagnosis Date  . Cancer [facility])    prostate and skin  . Hypertension   . Low testosterone   . Sleep apnea     Family History: Family History  Problem Relation Age of Onset  . ALS Mother   . Cancer Father   . Cancer Paternal Grandfather     Social History    Socioeconomic History  . Marital status: Married    Spouse name: Not on file  . Number of children: Not on file  . Years of education: Not on file  . Highest education level: Not on file  Occupational History  . Not on file  Tobacco Use  . Smoking status: Former Research scientist (life sciences)  . Smokeless tobacco: Former Systems developer    Types: Chew  Substance and Sexual Activity  . Alcohol use: Yes    Comment: social  . Drug use: No  . Sexual activity: Not on file  Other Topics Concern  . Not on file  Social History Narrative  . Not on file   Social Determinants of Health   Financial Resource Strain: Not on file  Food Insecurity: Not on file  Transportation Needs: Not on file  Physical Activity: Not on file  Stress: Not on file  Social Connections: Not on file  Intimate Partner Violence: Not on file      Review of Systems  Constitutional: Negative for activity change, appetite change and fatigue.  HENT: Negative for congestion and sinus pressure.   Respiratory: Negative for cough, shortness of breath and wheezing.   Gastrointestinal: Positive for constipation. Negative for diarrhea, nausea and vomiting.  Genitourinary: Negative for dysuria.  Musculoskeletal: Negative.   Skin: Negative.   Psychiatric/Behavioral: Negative.     Vital Signs: BP (!) 142/90  Pulse 77   Temp (!) 97.2 F (36.2 C)   Resp 16   Ht 6\' 2"  (1.88 m)   Wt 261 lb 6.4 oz (118.6 kg)   SpO2 98%   BMI 33.56 kg/m    Physical Exam Constitutional:      Appearance: He is obese.  HENT:     Head: Normocephalic and atraumatic.     Nose: Nose normal.  Eyes:     Extraocular Movements: Extraocular movements intact.     Pupils: Pupils are equal, round, and reactive to light.  Cardiovascular:     Rate and Rhythm: Normal rate and regular rhythm.     Pulses: Normal pulses.     Heart sounds: Normal heart sounds.  Pulmonary:     Effort: Pulmonary effort is normal.     Breath sounds: Normal breath sounds.  Abdominal:      General: Abdomen is flat.     Palpations: Abdomen is soft.  Musculoskeletal:        General: Normal range of motion.  Skin:    General: Skin is warm and dry.  Neurological:     Mental Status: He is alert.  Psychiatric:        Mood and Affect: Mood normal.        Behavior: Behavior normal.     Assessment/Plan: 1. OSA on CPAP Obtained new CPAP machine. Continue with compliance.  2. Obesity (BMI 30.0-34.9) Continue Trulicity for weight loss and sugar control.  3. Prediabetes Continue Trulicity for weight loss and sugar control. Sugars 90-110 at home. Continue to improve diet and exercise and will follow up with new A1c in 2 months.  4. Essential Hypertension Elevated BP today. Will continue to monitor. Normally well controlled.  General Counseling: eyal greenhaw understanding of the findings of todays visit and agrees with plan of treatment. I have discussed any further diagnostic evaluation that may be needed or ordered today. We also reviewed his medications today. he has been encouraged to call the office with any questions or concerns that should arise related to todays visit.    No orders of the defined types were placed in this encounter.   No orders of the defined types were placed in this encounter.   Total time spent:15 Minutes Time spent includes review of chart, medications, test results, and follow up plan with the patient.      Dr Lavera Guise Internal medicine

## 2020-07-25 ENCOUNTER — Other Ambulatory Visit: Payer: Self-pay | Admitting: Nurse Practitioner

## 2020-07-25 DIAGNOSIS — F339 Major depressive disorder, recurrent, unspecified: Secondary | ICD-10-CM

## 2020-07-30 ENCOUNTER — Ambulatory Visit: Payer: 59

## 2020-07-30 ENCOUNTER — Ambulatory Visit: Payer: 59 | Admitting: Internal Medicine

## 2020-07-30 NOTE — Progress Notes (Signed)
Pt canceled his appointment. Office staff will reach out to reschedule.  

## 2020-08-13 ENCOUNTER — Ambulatory Visit (INDEPENDENT_AMBULATORY_CARE_PROVIDER_SITE_OTHER): Payer: 59 | Admitting: Internal Medicine

## 2020-08-13 VITALS — Ht 74.0 in | Wt 253.0 lb

## 2020-08-13 DIAGNOSIS — E669 Obesity, unspecified: Secondary | ICD-10-CM

## 2020-08-13 DIAGNOSIS — I10 Essential (primary) hypertension: Secondary | ICD-10-CM

## 2020-08-13 DIAGNOSIS — G4733 Obstructive sleep apnea (adult) (pediatric): Secondary | ICD-10-CM

## 2020-08-13 DIAGNOSIS — E66811 Obesity, class 1: Secondary | ICD-10-CM | POA: Insufficient documentation

## 2020-08-13 DIAGNOSIS — Z7189 Other specified counseling: Secondary | ICD-10-CM

## 2020-08-13 DIAGNOSIS — Z9989 Dependence on other enabling machines and devices: Secondary | ICD-10-CM

## 2020-08-13 NOTE — Progress Notes (Signed)
[facility] [place], [place] [address]  Pulmonary Sleep Medicine   Office Visit Note  Patient Name: [name] DOB: 14-May-1956 MRN [id number]    Chief Complaint: Obstructive Sleep Apnea visit  Brief History:  [name] is seen today for initial consultation The patient has a 23 year history of sleep apnea. He recently received a replacement CPAP. Patient is using PAP nightly.  The patient feels more rested after sleeping with PAP.  The patient reports benfiting from PAP use. Reported sleepiness is  improved and the Epworth Sleepiness Score is 0 out of 24. The patient does not take naps. The patient complains of the following: no complaints  The compliance download showsexcellent  compliance with an average use time of 6 hours 42 minutes. The AHI is 1.9.  The patient reports RLS symptoms in the evening but he sleeps well and denies sleepiness.   ROS  General: (-) fever, (-) chills, (-) night sweat Nose and Sinuses: (-) nasal stuffiness or itchiness, (-) postnasal drip, (-) nosebleeds, (-) sinus trouble. Mouth and Throat: (-) sore throat, (-) hoarseness. Neck: (-) swollen glands, (-) enlarged thyroid, (-) neck pain. Respiratory: - cough, - shortness of breath, - wheezing. Neurologic: - numbness, - tingling. Psychiatric: - anxiety,  Depression doing well on present treatment   Current Medication: Outpatient Encounter Medications as of 08/13/2020  Medication Sig  . amLODipine-benazepril (LOTREL) 10-20 MG capsule Take 1 capsule by mouth daily.  Marland Kitchen aspirin 81 MG chewable tablet Chew 81 mg by mouth daily.  Marland Kitchen buPROPion (WELLBUTRIN SR) 100 MG 12 hr tablet Take 1 tablet (100 mg total) by mouth daily.  . Dulaglutide (TRULICITY) 1.91 YN/8.2NF SOPN Inject 0.75 mg into the skin once a week.  Marland Kitchen FLUoxetine (PROZAC) 20 MG tablet TAKE 3 TABLETS BY MOUTH EVERY DAY  . fluticasone (FLONASE) 50 MCG/ACT nasal spray Place 2 sprays into both nostrils daily.  Marland Kitchen glucose blood  (ACCU-CHEK GUIDE) test strip 1 each by Other route daily. Dx e11.65  . loratadine (CLARITIN) 10 MG tablet Take 1 tablet (10 mg total) by mouth daily.   No facility-administered encounter medications on file as of 08/13/2020.    Surgical History: Past Surgical History:  Procedure Laterality Date  . PROSTATE SURGERY    . REPLACEMENT TOTAL KNEE Right   . TONSILLECTOMY Bilateral     Medical History: Past Medical History:  Diagnosis Date  . Cancer [facility])    prostate and skin  . Hypertension   . Low testosterone   . Sleep apnea     Family History: Non contributory to the present illness  Social History: Social History   Socioeconomic History  . Marital status: Married    Spouse name: Not on file  . Number of children: Not on file  . Years of education: Not on file  . Highest education level: Not on file  Occupational History  . Not on file  Tobacco Use  . Smoking status: Former Research scientist (life sciences)  . Smokeless tobacco: Former Systems developer    Types: Chew  Substance and Sexual Activity  . Alcohol use: Yes    Comment: social  . Drug use: No  . Sexual activity: Not on file  Other Topics Concern  . Not on file  Social History Narrative  . Not on file   Social Determinants of Health   Financial Resource Strain: Not on file  Food Insecurity: Not on file  Transportation Needs: Not on file  Physical Activity: Not on file  Stress: Not  on file  Social Connections: Not on file  Intimate Partner Violence: Not on file    Vital Signs: Height 6\' 2"  (1.88 m), weight 253 lb (114.8 kg).  Examination: General Appearance: The patient is well-developed, well-nourished, and in no distress. Neck Circumference:  Skin: Gross inspection of skin unremarkable. Head: normocephalic, no gross deformities. Eyes: no gross deformities noted. ENT: ears appear grossly normal Neurologic: Alert and oriented. No involuntary movements.    EPWORTH SLEEPINESS SCALE:  Scale:  (0)= no chance of dozing; (1)=  slight chance of dozing; (2)= moderate chance of dozing; (3)= high chance of dozing  Chance  Situtation    Sitting and reading:     Watching TV:     Sitting Inactive in public:     As a passenger in car:       Lying down to rest:     Sitting and talking:     Sitting quielty after lunch:     In a car, stopped in traffic:    TOTAL SCORE:    out of 24    SLEEP STUDIES:  1. PSG 07/22/1997 AHI: 51.0 MIN SpO2: 92% 2. PSG 12/14/2005 AHI: 14.5 MIN SpO2: 88%   CPAP COMPLIANCE DATA:  Date Range: 06/25/2020 - 08/12/2020  Average Daily Use: 6 hours 42 minutes  Median Use: 6 hours 34 minutes  Compliance for > 4 Hours: 98%  AHI: 1.9 respiratory events per hour  Days Used: 48/49  Mask Leak: 68.7 L/min  95th Percentile Pressure: 8 cmH2O         LABS: No results found for this or any previous visit (from the past 2160 hour(s)).  Radiology: DG Knee 1-2 Views Right  Result Date: 02/06/2013 * PRIOR REPORT IMPORTED FROM AN EXTERNAL SYSTEM * PRIOR REPORT IMPORTED FROM THE SYNGO WORKFLOW SYSTEM REASON FOR EXAM:    postop COMMENTS:   Bedside (portable):Y PROCEDURE:     DXR - DXR KNEE RIGHT AP AND LATERAL  - Feb 06 2013 11:16AM RESULT:     History: Post-op TKR Findings: AP and lateral views of the right knee demonstrates a total right knee arthroplasty without evidence of hardware failure complication. There is no significant joint effusion. There is no fracture or dislocation. The alignment is anatomic. IMPRESSION: Right total knee arthroplasty. Dictation Site: 1     No results found.  No results found.    Assessment and Plan: Patient Active Problem List   Diagnosis Date Noted  . OSA on CPAP 08/13/2020  . CPAP use counseling 08/13/2020  . Obesity (BMI 30.0-34.9) 08/13/2020  . Encounter for general adult medical examination with abnormal findings 03/24/2019  . Dysuria 03/24/2019  . Depression, recurrent ([place]) 10/04/2017  . Moderate obesity 10/04/2017  . Acute  bronchitis 06/15/2017  . Cough 06/15/2017  . Essential hypertension 06/15/2017   1. OSA on CPAP The patient does tolerate PAP and reports significant benefit from PAP use. The patient was reminded how to clean the CPAP and advised to ensure adequate sleep time.  The compliance has been excellent and the apnea is controlled. The leak is elevated but he is not aware of this. OSA- continue excellent compliance    2. CPAP use counseling CPAP Counseling: had a lengthy discussion with the patient regarding the importance of PAP therapy in management of the sleep apnea. Patient appears to understand the risk factor reduction and also understands the risks associated with untreated sleep apnea. Patient will try to make a good faith effort to remain compliant  with therapy. Also instructed the patient on proper cleaning of the device including the water must be changed daily if possible and use of distilled water is preferred. Patient understands that the machine should be regularly cleaned with appropriate recommended cleaning solutions that do not damage the PAP machine for example given white vinegar and water rinses. Other methods such as ozone treatment may not be as good as these simple methods to achieve cleaning.  3. Obesity (BMI 30.0-34.9) Obesity Counseling: Had a lengthy discussion regarding patients BMI and weight issues. Patient was instructed on portion control as well as increased activity. Also discussed caloric restrictions with trying to maintain intake less than 2000 Kcal. Discussions were made in accordance with the 5As of weight management. Simple actions such as not eating late and if able to, taking a walk is suggested.  4. Essential hypertension Hypertension Counseling:   The following hypertensive lifestyle modification were recommended and discussed:  1. Limiting alcohol intake to less than 1 oz/day of ethanol:(24 oz of beer or 8 oz of wine or 2 oz of 100-proof whiskey). 2. Take  baby ASA 81 mg daily. 3. Importance of regular aerobic exercise and losing weight. 4. Reduce dietary saturated fat and cholesterol intake for overall cardiovascular health. 5. Maintaining adequate dietary potassium, calcium, and magnesium intake. 6. Regular monitoring of the blood pressure. 7. Reduce sodium intake to less than 100 mmol/day (less than 2.3 gm of sodium or less than 6 gm of sodium choride)     General Counseling: I have discussed the findings of the evaluation and examination with Dellis Filbert.  I have also discussed any further diagnostic evaluation thatmay be needed or ordered today. Tobias verbalizes understanding of the findings of todays visit. We also reviewed his medications today and discussed drug interactions and side effects including but not limited excessive drowsiness and altered mental states. We also discussed that there is always a risk not just to him but also people around him. he has been encouraged to call the office with any questions or concerns that should arise related to todays visit.  No orders of the defined types were placed in this encounter.       I have personally obtained a history, examined the patient, evaluated laboratory and imaging results, formulated the assessment and plan and placed orders.  This patient was seen today by Tressie Ellis, PA-C in collaboration with Dr. Devona Konig.   Richelle Ito Saunders Glance, PhD, FAASM  Diplomate, American Board of Sleep Medicine    Allyne Gee, MD [facility] Diplomate ABMS Pulmonary and Critical Care Medicine Sleep medicine

## 2020-08-13 NOTE — Patient Instructions (Signed)

## 2020-08-21 ENCOUNTER — Other Ambulatory Visit: Payer: Self-pay | Admitting: Hospice and Palliative Medicine

## 2020-08-21 DIAGNOSIS — J302 Other seasonal allergic rhinitis: Secondary | ICD-10-CM

## 2020-08-24 ENCOUNTER — Ambulatory Visit: Payer: 59 | Admitting: Physician Assistant

## 2020-09-10 ENCOUNTER — Ambulatory Visit: Payer: 59 | Admitting: Physician Assistant

## 2020-09-10 ENCOUNTER — Encounter: Payer: Self-pay | Admitting: Physician Assistant

## 2020-09-10 ENCOUNTER — Other Ambulatory Visit: Payer: Self-pay

## 2020-09-10 DIAGNOSIS — M1712 Unilateral primary osteoarthritis, left knee: Secondary | ICD-10-CM

## 2020-09-10 DIAGNOSIS — I10 Essential (primary) hypertension: Secondary | ICD-10-CM

## 2020-09-10 DIAGNOSIS — G4733 Obstructive sleep apnea (adult) (pediatric): Secondary | ICD-10-CM

## 2020-09-10 DIAGNOSIS — E1165 Type 2 diabetes mellitus with hyperglycemia: Secondary | ICD-10-CM | POA: Diagnosis not present

## 2020-09-10 DIAGNOSIS — Z9989 Dependence on other enabling machines and devices: Secondary | ICD-10-CM

## 2020-09-10 DIAGNOSIS — E669 Obesity, unspecified: Secondary | ICD-10-CM

## 2020-09-10 LAB — POCT GLYCOSYLATED HEMOGLOBIN (HGB A1C): Hemoglobin A1C: 5.4 % (ref 4.0–5.6)

## 2020-09-10 NOTE — Progress Notes (Signed)
[facility] [place], [place] [address]  Internal MEDICINE  Office Visit Note  Patient Name: [name]  [id number]  [id number]  Date of Service: 09/16/2020  Chief Complaint  Patient presents with  . Follow-up    Discuss meds    HPI Pt is here for f/u and has no complaints today. -BP at home 130s/80s. Recheck in office 140/90 -Wt has come down since being on trulicity, AM sugar <657. Denies any dizziness, syncope, or shakiness. He feels great. Lost 22 lbs since sept. Getting ready to increase outdoor exercise with biking. -Does request A1c today which was 5.4. He mentions he has 2 months of trulicity left and would like to finish it out, but then will hold off on any new diabetic medications since he is back in normal range and can maintain with diet/exercise goals. -Needs L knee replacement and has been putting off. He will contact ortho who did his R knee years ago and see if he can have this evaluated. He may request referral if needed to be seen by their office after so many years. -New CPAP machine has been wonderful. Working better than his old machine and sleeping well. -Covid 1 month ago and did ok on it.  Current Medication: Outpatient Encounter Medications as of 09/10/2020  Medication Sig  . amLODipine-benazepril (LOTREL) 10-20 MG capsule Take 1 capsule by mouth daily.  Marland Kitchen aspirin 81 MG chewable tablet Chew 81 mg by mouth daily.  Marland Kitchen buPROPion (WELLBUTRIN SR) 100 MG 12 hr tablet Take 1 tablet (100 mg total) by mouth daily.  . Dulaglutide (TRULICITY) 8.46 NG/2.9BM SOPN Inject 0.75 mg into the skin once a week.  Marland Kitchen FLUoxetine (PROZAC) 20 MG tablet TAKE 3 TABLETS BY MOUTH EVERY DAY  . fluticasone (FLONASE) 50 MCG/ACT nasal spray SPRAY 2 SPRAYS INTO EACH NOSTRIL EVERY DAY  . glucose blood (ACCU-CHEK GUIDE) test strip 1 each by Other route daily. Dx e11.65  . loratadine (CLARITIN) 10 MG tablet Take 1 tablet (10 mg total) by mouth daily.   No  facility-administered encounter medications on file as of 09/10/2020.    Surgical History: Past Surgical History:  Procedure Laterality Date  . PROSTATE SURGERY    . REPLACEMENT TOTAL KNEE Right   . TONSILLECTOMY Bilateral     Medical History: Past Medical History:  Diagnosis Date  . Cancer [facility])    prostate and skin  . Hypertension   . Low testosterone   . Sleep apnea     Family History: Family History  Problem Relation Age of Onset  . ALS Mother   . Cancer Father   . Cancer Paternal Grandfather     Social History   Socioeconomic History  . Marital status: Married    Spouse name: Not on file  . Number of children: Not on file  . Years of education: Not on file  . Highest education level: Not on file  Occupational History  . Not on file  Tobacco Use  . Smoking status: Former Research scientist (life sciences)  . Smokeless tobacco: Former Systems developer    Types: Chew  Substance and Sexual Activity  . Alcohol use: Yes    Comment: social  . Drug use: No  . Sexual activity: Not on file  Other Topics Concern  . Not on file  Social History Narrative  . Not on file   Social Determinants of Health   Financial Resource Strain: Not on file  Food Insecurity: Not on file  Transportation Needs: Not on  file  Physical Activity: Not on file  Stress: Not on file  Social Connections: Not on file  Intimate Partner Violence: Not on file      Review of Systems  Constitutional: Negative for chills, fatigue and unexpected weight change.  HENT: Negative for congestion, postnasal drip, rhinorrhea, sneezing and sore throat.   Eyes: Negative for redness.  Respiratory: Positive for apnea. Negative for cough, chest tightness and shortness of breath.        Apnea well controlled on CPAP  Cardiovascular: Negative for chest pain and palpitations.  Gastrointestinal: Negative for abdominal pain, constipation, diarrhea, nausea and vomiting.  Genitourinary: Negative for dysuria and frequency.  Musculoskeletal:  Positive for arthralgias. Negative for back pain, joint swelling and neck pain.  Skin: Negative for rash.  Neurological: Negative.  Negative for tremors and numbness.  Hematological: Negative for adenopathy. Does not bruise/bleed easily.  Psychiatric/Behavioral: Negative for behavioral problems (Depression), sleep disturbance and suicidal ideas. The patient is not nervous/anxious.     Vital Signs: BP (!) 140/98   Pulse 75   Temp (!) 97 F (36.1 C)   Resp 16   Ht 6\' 2"  (1.88 m)   Wt 253 lb 6.4 oz (114.9 kg)   SpO2 98%   BMI 32.53 kg/m    Physical Exam Vitals and nursing note reviewed.  Constitutional:      General: He is not in acute distress.    Appearance: He is well-developed. He is obese. He is not diaphoretic.  HENT:     Head: Normocephalic and atraumatic.     Mouth/Throat:     Pharynx: No oropharyngeal exudate.  Eyes:     Pupils: Pupils are equal, round, and reactive to light.  Neck:     Thyroid: No thyromegaly.     Vascular: No JVD.     Trachea: No tracheal deviation.  Cardiovascular:     Rate and Rhythm: Normal rate and regular rhythm.     Heart sounds: Normal heart sounds. No murmur heard. No friction rub. No gallop.   Pulmonary:     Effort: Pulmonary effort is normal. No respiratory distress.     Breath sounds: No wheezing or rales.  Chest:     Chest wall: No tenderness.  Abdominal:     General: Bowel sounds are normal.     Palpations: Abdomen is soft.  Musculoskeletal:        General: Normal range of motion.     Cervical back: Normal range of motion and neck supple.  Lymphadenopathy:     Cervical: No cervical adenopathy.  Skin:    General: Skin is warm and dry.  Neurological:     Mental Status: He is alert and oriented to person, place, and time.     Cranial Nerves: No cranial nerve deficit.  Psychiatric:        Behavior: Behavior normal.        Thought Content: Thought content normal.        Judgment: Judgment normal.         Assessment/Plan: 1. Essential hypertension Stable on recheck, will continue Lotrel and monitor at home  2. Uncontrolled type 2 diabetes mellitus with hyperglycemia (HCC) - POCT glycosylated hemoglobin (Hb A1C) improved to 5.4 today. Will complete remaining trulicity at home and then control with diet and exercise. Continue to monitor BG at home.  3. Primary osteoarthritis of left knee Will f/u with ortho regarding possible L knee replacement and let us know if a referral is required in  order to re-establish with ortho office.  4. OSA on CPAP Continue nightly use of CPAP.  5. Obesity (BMI 30.0-34.9) Obesity Counseling: Had a lengthy discussion regarding patients BMI and weight issues. Patient was instructed on portion control as well as increased activity. Also discussed caloric restrictions with trying to maintain intake less than 2000 Kcal. Discussions were made in accordance with the 5As of weight management. Simple actions such as not eating late and if able to, taking a walk is suggested.   General Counseling: antavion bartoszek understanding of the findings of todays visit and agrees with plan of treatment. I have discussed any further diagnostic evaluation that may be needed or ordered today. We also reviewed his medications today. he has been encouraged to call the office with any questions or concerns that should arise related to todays visit.    Orders Placed This Encounter  Procedures  . POCT glycosylated hemoglobin (Hb A1C)    No orders of the defined types were placed in this encounter.   This patient was seen by Drema Dallas, PA-C in collaboration with Dr. Clayborn Bigness as a part of collaborative care agreement.   Total time spent:30 Minutes Time spent includes review of chart, medications, test results, and follow up plan with the patient.      Dr Lavera Guise Internal medicine

## 2020-09-10 NOTE — Patient Instructions (Signed)

## 2020-10-03 ENCOUNTER — Other Ambulatory Visit: Payer: Self-pay | Admitting: Adult Health

## 2020-10-03 DIAGNOSIS — F339 Major depressive disorder, recurrent, unspecified: Secondary | ICD-10-CM

## 2020-10-28 ENCOUNTER — Other Ambulatory Visit: Payer: Self-pay | Admitting: Adult Health

## 2020-10-28 DIAGNOSIS — F339 Major depressive disorder, recurrent, unspecified: Secondary | ICD-10-CM

## 2020-11-02 ENCOUNTER — Other Ambulatory Visit: Payer: Self-pay | Admitting: Internal Medicine

## 2020-11-05 ENCOUNTER — Other Ambulatory Visit: Payer: Self-pay

## 2020-11-05 ENCOUNTER — Encounter: Payer: Self-pay | Admitting: Physician Assistant

## 2020-11-05 DIAGNOSIS — F339 Major depressive disorder, recurrent, unspecified: Secondary | ICD-10-CM

## 2020-11-05 MED ORDER — BUPROPION HCL ER (SR) 100 MG PO TB12: 100.0000 mg | ORAL_TABLET | Freq: Every day | ORAL | 1 refills | Status: DC

## 2020-12-10 ENCOUNTER — Ambulatory Visit: Payer: 59 | Admitting: Physician Assistant

## 2020-12-10 ENCOUNTER — Encounter: Payer: Self-pay | Admitting: Physician Assistant

## 2020-12-10 ENCOUNTER — Other Ambulatory Visit: Payer: Self-pay

## 2020-12-10 DIAGNOSIS — Z9079 Acquired absence of other genital organ(s): Secondary | ICD-10-CM

## 2020-12-10 DIAGNOSIS — I10 Essential (primary) hypertension: Secondary | ICD-10-CM

## 2020-12-10 DIAGNOSIS — R1031 Right lower quadrant pain: Secondary | ICD-10-CM | POA: Diagnosis not present

## 2020-12-10 DIAGNOSIS — Z23 Encounter for immunization: Secondary | ICD-10-CM

## 2020-12-10 DIAGNOSIS — Z9989 Dependence on other enabling machines and devices: Secondary | ICD-10-CM

## 2020-12-10 DIAGNOSIS — E66811 Obesity, class 1: Secondary | ICD-10-CM

## 2020-12-10 DIAGNOSIS — E669 Obesity, unspecified: Secondary | ICD-10-CM

## 2020-12-10 DIAGNOSIS — G4733 Obstructive sleep apnea (adult) (pediatric): Secondary | ICD-10-CM

## 2020-12-10 DIAGNOSIS — R5383 Other fatigue: Secondary | ICD-10-CM

## 2020-12-10 MED ORDER — ZOSTER VAC RECOMB ADJUVANTED 50 MCG/0.5ML IM SUSR: 0.5000 mL | Freq: Once | INTRAMUSCULAR | 1 refills | Status: AC

## 2020-12-10 MED ORDER — PNEUMOCOCCAL 20-VAL CONJ VACC 0.5 ML IM SUSY: 0.5000 mL | PREFILLED_SYRINGE | INTRAMUSCULAR | 0 refills | Status: AC

## 2020-12-10 NOTE — Progress Notes (Addendum)
[facility] [place], [place] [address]  Internal MEDICINE  Office Visit Note  Patient Name: [name]  [id number]  [id number]  Date of Service: 12/15/2020  Chief Complaint  Patient presents with   Follow-up    Needs statin therapy, discuss hernias possible hernia near groin    Sleep Apnea   Hypertension   Quality Metric Gaps    Shingrix, covid booster    HPI Pt is here for routine follow up BP normally well controlled at home. Usually elevated on first check but improved on recheck. -wearing CPAP nightly -Pain in right side behind/in right testicle as he sits and bends. Not bothering him today. No noticeable swelling or bulge. He declines testicular exam today since he has been unable to reproduce it today and would prefer to have male provider via urology to check this. Did discuss signs to look out for as he may have an inguinal hernia and may need to see GS.  Does report he had a radical prostatectomy 20 years ago and used to be seen by urology, but has not been in a long time. -Still doing trulicity and is helping with wt loss. BG 90s in AM, 140s after eating -Dropped wt since last blood draw and would like to wait to start statin to see if cholesterol improved on lab recheck since he is due for labs and CPE next visit. Discussed that statin is also a cardioprotective medication and is recommended with his diabetes, though his sugars are completely controlled now.  Current Medication: Outpatient Encounter Medications as of 12/10/2020  Medication Sig   ACCU-CHEK GUIDE test strip 1 EACH BY OTHER ROUTE DAILY. DX E11.65   amLODipine-benazepril (LOTREL) 10-20 MG capsule Take 1 capsule by mouth daily.   buPROPion (WELLBUTRIN SR) 100 MG 12 hr tablet Take 1 tablet (100 mg total) by mouth daily.   Dulaglutide (TRULICITY) 2.35 TD/3.2KG SOPN Inject 0.75 mg into the skin once a week.   FLUoxetine (PROZAC) 20 MG tablet TAKE 3 TABLETS BY MOUTH EVERY DAY    [DISCONTINUED] pneumococcal 20-Val Conj Vacc (PREVNAR 20) 0.5 ML SUSY Inject 0.5 mLs into the muscle tomorrow at 10 am.   [DISCONTINUED] Zoster Vaccine Adjuvanted [facility]) injection Inject 0.5 mLs into the muscle once.   [EXPIRED] pneumococcal 20-Val Conj Vacc (PREVNAR 20) 0.5 ML SUSY Inject 0.5 mLs into the muscle tomorrow at 10 am for 1 dose.   [EXPIRED] Zoster Vaccine Adjuvanted [facility]) injection Inject 0.5 mLs into the muscle once for 1 dose.   [DISCONTINUED] aspirin 81 MG chewable tablet Chew 81 mg by mouth daily. (Patient not taking: Reported on 12/10/2020)   [DISCONTINUED] fluticasone (FLONASE) 50 MCG/ACT nasal spray SPRAY 2 SPRAYS INTO EACH NOSTRIL EVERY DAY   [DISCONTINUED] loratadine (CLARITIN) 10 MG tablet Take 1 tablet (10 mg total) by mouth daily.   No facility-administered encounter medications on file as of 12/10/2020.    Surgical History: Past Surgical History:  Procedure Laterality Date   PROSTATE SURGERY     REPLACEMENT TOTAL KNEE Right    TONSILLECTOMY Bilateral     Medical History: Past Medical History:  Diagnosis Date   Cancer ([place])    prostate and skin   Hypertension    Low testosterone    Sleep apnea     Family History: Family History  Problem Relation Age of Onset   ALS Mother    Cancer Father    Cancer Paternal Grandfather     Social History   Socioeconomic History  Marital status: Married    Spouse name: Not on file   Number of children: Not on file   Years of education: Not on file   Highest education level: Not on file  Occupational History   Not on file  Tobacco Use   Smoking status: Former    Pack years: 0.00   Smokeless tobacco: Former    Types: Chew  Substance and Sexual Activity   Alcohol use: Yes    Comment: social   Drug use: No   Sexual activity: Not on file  Other Topics Concern   Not on file  Social History Narrative   Not on file   Social Determinants of Health   Financial Resource Strain: Not on file  Food  Insecurity: Not on file  Transportation Needs: Not on file  Physical Activity: Not on file  Stress: Not on file  Social Connections: Not on file  Intimate Partner Violence: Not on file      Review of Systems  Constitutional:  Negative for chills, fatigue and unexpected weight change.  HENT:  Negative for congestion, postnasal drip, rhinorrhea, sneezing and sore throat.   Eyes:  Negative for redness.  Respiratory:  Negative for cough, chest tightness and shortness of breath.   Cardiovascular:  Negative for chest pain and palpitations.  Gastrointestinal:  Negative for abdominal pain, constipation, diarrhea, nausea and vomiting.  Genitourinary:  Positive for testicular pain. Negative for difficulty urinating, dysuria, frequency, hematuria, penile discharge and scrotal swelling.  Musculoskeletal:  Negative for arthralgias, back pain, joint swelling and neck pain.  Skin:  Negative for rash.  Neurological: Negative.  Negative for tremors and numbness.  Hematological:  Negative for adenopathy. Does not bruise/bleed easily.  Psychiatric/Behavioral:  Negative for behavioral problems (Depression), sleep disturbance and suicidal ideas. The patient is not nervous/anxious.    Vital Signs: BP 140/88 Comment: 150/90  Pulse 78   Temp (!) 97.1 F (36.2 C)   Resp 16   Ht 6\' 2"  (1.88 m)   Wt 252 lb 9.6 oz (114.6 kg)   SpO2 98%   BMI 32.43 kg/m    Physical Exam Vitals and nursing note reviewed.  Constitutional:      General: He is not in acute distress.    Appearance: He is well-developed. He is obese. He is not diaphoretic.  HENT:     Head: Normocephalic and atraumatic.     Mouth/Throat:     Pharynx: No oropharyngeal exudate.  Eyes:     Pupils: Pupils are equal, round, and reactive to light.  Neck:     Thyroid: No thyromegaly.     Vascular: No JVD.     Trachea: No tracheal deviation.  Cardiovascular:     Rate and Rhythm: Normal rate and regular rhythm.     Heart sounds: Normal  heart sounds. No murmur heard.   No friction rub. No gallop.  Pulmonary:     Effort: Pulmonary effort is normal. No respiratory distress.     Breath sounds: No wheezing or rales.  Chest:     Chest wall: No tenderness.  Abdominal:     General: Bowel sounds are normal.     Palpations: Abdomen is soft.  Musculoskeletal:        General: Normal range of motion.     Cervical back: Normal range of motion and neck supple.  Lymphadenopathy:     Cervical: No cervical adenopathy.  Skin:    General: Skin is warm and dry.  Neurological:  Mental Status: He is alert and oriented to person, place, and time.     Cranial Nerves: No cranial nerve deficit.  Psychiatric:        Behavior: Behavior normal.        Thought Content: Thought content normal.        Judgment: Judgment normal.       Assessment/Plan: 1. Essential hypertension Stable, continue current therapy  2. Right inguinal pain Some right inguinal pain and behind right testicle--discussed possible hernia however no symptoms in office and declined exam in office today. Would like to see urology and prefers male provider perform exam. Should call office or go to ED if acute worsening. May need imaging or referral to [place]. - Ambulatory referral to Urology  3. OSA on CPAP Continue CPAP  4. Obesity (BMI 30.0-34.9) Continue to work on diet and exercise to work on wt loss  5. Hx of radical prostatectomy Previously followed by urology, and would like referral back to urology now for intermittent inguinal/testicle pain  6. Need for pneumococcal vaccination - pneumococcal 20-Val Conj Vacc (PREVNAR 20) 0.5 ML SUSY; Inject 0.5 mLs into the muscle tomorrow at 10 am for 1 dose.  Dispense: 0.5 mL; Refill: 0  7. Need for shingles vaccine - Zoster Vaccine Adjuvanted [facility]) injection; Inject 0.5 mLs into the muscle once for 1 dose.  Dispense: 0.5 mL; Refill: 1  8. Other fatigue - CBC w/Diff/Platelet - Comprehensive metabolic panel -  TSH + free T4 - Lipid Panel With LDL/HDL Ratio - PSA Total (Reflex To Free)    General Counseling: Binyomin verbalizes understanding of the findings of todays visit and agrees with plan of treatment. I have discussed any further diagnostic evaluation that may be needed or ordered today. We also reviewed his medications today. he has been encouraged to call the office with any questions or concerns that should arise related to todays visit.    Orders Placed This Encounter  Procedures   CBC w/Diff/Platelet   Comprehensive metabolic panel   TSH + free T4   Lipid Panel With LDL/HDL Ratio   PSA Total (Reflex To Free)   Ambulatory referral to Urology    Meds ordered this encounter  Medications   Zoster Vaccine Adjuvanted [facility]) injection    Sig: Inject 0.5 mLs into the muscle once for 1 dose.    Dispense:  0.5 mL    Refill:  1   pneumococcal 20-Val Conj Vacc (PREVNAR 20) 0.5 ML SUSY    Sig: Inject 0.5 mLs into the muscle tomorrow at 10 am for 1 dose.    Dispense:  0.5 mL    Refill:  0    This patient was seen by Drema Dallas, PA-C in collaboration with Dr. Clayborn Bigness as a part of collaborative care agreement.   Total time spent:30 Minutes Time spent includes review of chart, medications, test results, and follow up plan with the patient.      Dr Lavera Guise Internal medicine

## 2020-12-17 ENCOUNTER — Ambulatory Visit: Payer: 59 | Admitting: Urology

## 2020-12-17 ENCOUNTER — Encounter: Payer: Self-pay | Admitting: Urology

## 2020-12-17 ENCOUNTER — Other Ambulatory Visit: Payer: Self-pay

## 2020-12-17 VITALS — BP 155/104 | HR 76 | Ht 74.0 in | Wt 251.0 lb

## 2020-12-17 DIAGNOSIS — N5082 Scrotal pain: Secondary | ICD-10-CM

## 2020-12-17 DIAGNOSIS — R1031 Right lower quadrant pain: Secondary | ICD-10-CM

## 2020-12-17 MED ORDER — CELECOXIB 200 MG PO CAPS: 200.0000 mg | ORAL_CAPSULE | Freq: Two times a day (BID) | ORAL | 0 refills | Status: DC

## 2020-12-17 NOTE — Progress Notes (Signed)
   12/17/2020 2:56 PM   Ovid Curd [birth year] [id number]  Referring provider: Mylinda Latina, PA-C [place],  [place] [address]  Chief Complaint  Patient presents with   Groin Pain    HPI: [name] is a 65 y.o. male referred for evaluation of right inguinal pain.  2 month history of pain posterior to right testis Pain exacerbated with kneeling/bending and radiates into the groin region History of prostate cancer status post radical prostatectomy ~ 18 years ago.  Performed in [place] Does have nocturia and no bothersome daytime voiding symptoms Recent PSA undetectable No history of trauma   PMH: Past Medical History:  Diagnosis Date   Cancer ([place])    prostate and skin   Hypertension    Low testosterone    Sleep apnea     Surgical History: Past Surgical History:  Procedure Laterality Date   PROSTATE SURGERY     REPLACEMENT TOTAL KNEE Right    TONSILLECTOMY Bilateral     Home Medications:  Allergies as of 12/17/2020   No Known Allergies      Medication List        Accurate as of December 17, 2020  2:56 PM. If you have any questions, ask your nurse or doctor.          Accu-Chek Guide test strip Generic drug: glucose blood 1 EACH BY OTHER ROUTE DAILY. DX E11.65   amLODipine-benazepril 10-20 MG capsule Commonly known as: LOTREL Take 1 capsule by mouth daily.   buPROPion 100 MG 12 hr tablet Commonly known as: Wellbutrin SR Take 1 tablet (100 mg total) by mouth daily.   FLUoxetine 20 MG tablet Commonly known as: PROZAC TAKE 3 TABLETS BY MOUTH EVERY DAY   Trulicity 8.29 FA/2.1HY Sopn Generic drug: Dulaglutide Inject 0.75 mg into the skin once a week.        Allergies: No Known Allergies  Family History: Family History  Problem Relation Age of Onset   ALS Mother    Cancer Father    Cancer Paternal Grandfather     Social History:  reports that he has quit smoking. He has quit using smokeless tobacco.  His smokeless  tobacco use included chew. He reports current alcohol use. He reports that he does not use drugs.   Physical Exam: BP (!) 155/104   Pulse 76   Ht 6\' 2"  (1.88 m)   Wt 251 lb (113.9 kg)   BMI 32.23 kg/m   Constitutional:  Alert and oriented, No acute distress. HEENT: [place] AT, moist mucus membranes.  Trachea midline, no masses. Cardiovascular: No clubbing, cyanosis, or edema. Respiratory: Normal respiratory effort, no increased work of breathing. GI: Abdomen is soft, nontender, nondistended, no abdominal masses GU: Phallus without lesions, testes descended bilaterally without masses or tenderness.  Spermatic cord/epididymis palpably normal bilaterally without mass or tenderness.  No hernia appreciated Skin: No rashes, bruises or suspicious lesions. Neurologic: Grossly intact, no focal deficits, moving all 4 extremities. Psychiatric: Normal mood and affect.   Assessment & Plan:    1.  Right groin/scrotal pain No significant abnormalities on exam Pain most likely musculoskeletal Rx Celebrex 200 mg twice daily x2 weeks CT pelvis ordered to evaluate for any obvious abnormalities    Abbie Sons, MD  [facility] [address] [place], [place] [address] [phone]

## 2021-01-17 ENCOUNTER — Encounter: Payer: Self-pay | Admitting: Internal Medicine

## 2021-01-18 ENCOUNTER — Other Ambulatory Visit: Payer: Self-pay

## 2021-01-18 DIAGNOSIS — E1165 Type 2 diabetes mellitus with hyperglycemia: Secondary | ICD-10-CM

## 2021-01-18 MED ORDER — TRULICITY 0.75 MG/0.5ML ~~LOC~~ SOAJ: 0.7500 mg | SUBCUTANEOUS | 3 refills | Status: DC

## 2021-01-21 ENCOUNTER — Other Ambulatory Visit: Payer: Self-pay | Admitting: Internal Medicine

## 2021-01-21 DIAGNOSIS — F339 Major depressive disorder, recurrent, unspecified: Secondary | ICD-10-CM

## 2021-01-28 ENCOUNTER — Other Ambulatory Visit: Payer: Self-pay

## 2021-01-28 ENCOUNTER — Encounter: Payer: Self-pay | Admitting: Physician Assistant

## 2021-01-28 DIAGNOSIS — F339 Major depressive disorder, recurrent, unspecified: Secondary | ICD-10-CM

## 2021-01-28 DIAGNOSIS — I10 Essential (primary) hypertension: Secondary | ICD-10-CM

## 2021-01-28 MED ORDER — AMLODIPINE BESY-BENAZEPRIL HCL 10-20 MG PO CAPS: 1.0000 | ORAL_CAPSULE | Freq: Every day | ORAL | 3 refills | Status: DC

## 2021-01-28 MED ORDER — BUPROPION HCL ER (SR) 100 MG PO TB12: 100.0000 mg | ORAL_TABLET | Freq: Every day | ORAL | 1 refills | Status: DC

## 2021-02-19 LAB — COMPREHENSIVE METABOLIC PANEL
ALT: 20 IU/L (ref 0–44)
AST: 22 IU/L (ref 0–40)
Albumin/Globulin Ratio: 2.4 — ABNORMAL HIGH (ref 1.2–2.2)
Albumin: 4.4 g/dL (ref 3.8–4.8)
Alkaline Phosphatase: 108 IU/L (ref 44–121)
BUN/Creatinine Ratio: 14 (ref 10–24)
BUN: 16 mg/dL (ref 8–27)
Bilirubin Total: 0.5 mg/dL (ref 0.0–1.2)
CO2: 24 mmol/L (ref 20–29)
Calcium: 9 mg/dL (ref 8.6–10.2)
Chloride: 103 mmol/L (ref 96–106)
Creatinine, Ser: 1.18 mg/dL (ref 0.76–1.27)
Globulin, Total: 1.8 g/dL (ref 1.5–4.5)
Glucose: 97 mg/dL (ref 65–99)
Potassium: 4.3 mmol/L (ref 3.5–5.2)
Sodium: 140 mmol/L (ref 134–144)
Total Protein: 6.2 g/dL (ref 6.0–8.5)
eGFR: 68 mL/min/{1.73_m2} (ref >59)

## 2021-02-19 LAB — CBC WITH DIFFERENTIAL/PLATELET
Basophils Absolute: 0.1 10*3/uL (ref 0.0–0.2)
Basos: 1 %
EOS (ABSOLUTE): 0.4 10*3/uL (ref 0.0–0.4)
Eos: 5 %
Hematocrit: 46.4 % (ref 37.5–51.0)
Hemoglobin: 15.5 g/dL (ref 13.0–17.7)
Immature Grans (Abs): 0 10*3/uL (ref 0.0–0.1)
Immature Granulocytes: 0 %
Lymphocytes Absolute: 2 10*3/uL (ref 0.7–3.1)
Lymphs: 25 %
MCH: 28.6 pg (ref 26.6–33.0)
MCHC: 33.4 g/dL (ref 31.5–35.7)
MCV: 86 fL (ref 79–97)
Monocytes Absolute: 0.6 10*3/uL (ref 0.1–0.9)
Monocytes: 8 %
Neutrophils Absolute: 4.9 10*3/uL (ref 1.4–7.0)
Neutrophils: 61 %
Platelets: 261 10*3/uL (ref 150–450)
RBC: 5.42 x10E6/uL (ref 4.14–5.80)
RDW: 14 % (ref 11.6–15.4)
WBC: 8 10*3/uL (ref 3.4–10.8)

## 2021-02-19 LAB — LIPID PANEL WITH LDL/HDL RATIO
Cholesterol, Total: 165 mg/dL (ref 100–199)
HDL: 34 mg/dL — ABNORMAL LOW (ref >39)
LDL Chol Calc (NIH): 101 mg/dL — ABNORMAL HIGH (ref 0–99)
LDL/HDL Ratio: 3 ratio (ref 0.0–3.6)
Triglycerides: 168 mg/dL — ABNORMAL HIGH (ref 0–149)
VLDL Cholesterol Cal: 30 mg/dL (ref 5–40)

## 2021-02-19 LAB — TSH+FREE T4
Free T4: 1.13 ng/dL (ref 0.82–1.77)
TSH: 3.01 u[IU]/mL (ref 0.450–4.500)

## 2021-02-19 LAB — PSA TOTAL (REFLEX TO FREE): Prostate Specific Ag, Serum: <0.1 ng/mL (ref 0.0–4.0)

## 2021-03-01 ENCOUNTER — Encounter: Payer: 59 | Admitting: Nurse Practitioner

## 2021-03-04 ENCOUNTER — Encounter: Payer: 59 | Admitting: Physician Assistant

## 2021-03-18 ENCOUNTER — Encounter: Payer: 59 | Admitting: Physician Assistant

## 2021-04-07 ENCOUNTER — Encounter: Payer: Self-pay | Admitting: Internal Medicine

## 2021-04-13 NOTE — Telephone Encounter (Signed)
If he has not seen Dr. Marry Guan for 8 years, will most likely need new referral with notes pertaining to his new pain.

## 2021-04-13 NOTE — Telephone Encounter (Signed)
He said he will wait until his 05/27/21 visit to discuss referral.

## 2021-04-22 ENCOUNTER — Encounter: Payer: Self-pay | Admitting: Internal Medicine

## 2021-04-23 NOTE — Telephone Encounter (Signed)
You have appt with [place] next week bring your bp reading on your next visit

## 2021-04-26 ENCOUNTER — Other Ambulatory Visit: Payer: Self-pay | Admitting: Internal Medicine

## 2021-05-27 ENCOUNTER — Telehealth: Payer: Self-pay

## 2021-05-27 ENCOUNTER — Other Ambulatory Visit: Payer: Self-pay

## 2021-05-27 ENCOUNTER — Ambulatory Visit (INDEPENDENT_AMBULATORY_CARE_PROVIDER_SITE_OTHER): Payer: 59 | Admitting: Physician Assistant

## 2021-05-27 ENCOUNTER — Encounter: Payer: Self-pay | Admitting: Physician Assistant

## 2021-05-27 DIAGNOSIS — G4733 Obstructive sleep apnea (adult) (pediatric): Secondary | ICD-10-CM

## 2021-05-27 DIAGNOSIS — E782 Mixed hyperlipidemia: Secondary | ICD-10-CM

## 2021-05-27 DIAGNOSIS — Z9989 Dependence on other enabling machines and devices: Secondary | ICD-10-CM

## 2021-05-27 DIAGNOSIS — M545 Low back pain, unspecified: Secondary | ICD-10-CM | POA: Diagnosis not present

## 2021-05-27 DIAGNOSIS — I10 Essential (primary) hypertension: Secondary | ICD-10-CM

## 2021-05-27 DIAGNOSIS — M25562 Pain in left knee: Secondary | ICD-10-CM | POA: Diagnosis not present

## 2021-05-27 DIAGNOSIS — E119 Type 2 diabetes mellitus without complications: Secondary | ICD-10-CM

## 2021-05-27 DIAGNOSIS — R300 Dysuria: Secondary | ICD-10-CM

## 2021-05-27 DIAGNOSIS — Z1211 Encounter for screening for malignant neoplasm of colon: Secondary | ICD-10-CM

## 2021-05-27 DIAGNOSIS — Z1212 Encounter for screening for malignant neoplasm of rectum: Secondary | ICD-10-CM

## 2021-05-27 DIAGNOSIS — G8929 Other chronic pain: Secondary | ICD-10-CM

## 2021-05-27 DIAGNOSIS — Z0001 Encounter for general adult medical examination with abnormal findings: Secondary | ICD-10-CM

## 2021-05-27 LAB — POCT GLYCOSYLATED HEMOGLOBIN (HGB A1C): Hemoglobin A1C: 5.4 % (ref 4.0–5.6)

## 2021-05-27 MED ORDER — AMLODIPINE BESY-BENAZEPRIL HCL 10-40 MG PO CAPS: 1.0000 | ORAL_CAPSULE | Freq: Every day | ORAL | 1 refills | Status: DC

## 2021-05-27 NOTE — Telephone Encounter (Signed)
Ortho referral sent via [facility] to [place]

## 2021-05-27 NOTE — Progress Notes (Signed)
[facility] [place], [place] [address]  Internal MEDICINE  Office Visit Note  Patient Name: [name]  [id number]  [id number]  Date of Service: 05/27/2021  Chief Complaint  Patient presents with   Annual Exam   Hypertension   Back Pain    Chronic lower back pain, has seen a urologist for this, did not find anything      HPI Pt is here for routine health maintenance examination Has seen urology and they did not find anything on exam. Had been having some heaviness and discomfort in right groin and behind testicle. He has a hx of prostate cancer and radical prostatectomy many years ago. Urology gave him celebrex to see if more MSK related pain which helped a little but not much. They had also ordered a CT pelvis but pt states that insurance denied this. Overall feels a little better. States more noticeable pain now is in his lower back -Left knee pain, and low back pain worse now. He is limping with left knee and discussed that the alteration in his gait may be contributing to hip or low back pain. He had his right knee replaced years ago with Dr. Marry Guan at [facility] clinic and would like to be referred back to him for his knee and back. Referral placed today. -Has been on trulciity for the last year, his A1c is no longer in prediabetic range and may discontinue when he runs out of current script if sugars remain well controlled. Fasting FG range 90-120 mostly with an occasional higher reading of 140. -BP at home has been 150/80s mostly with the occasional higher reading and is elevated in office today. Discussed increasing BP meds and have him continue to track daily. He has a tracker on his phone. -Discussed labs overall look good, except cholesterol slightly elevated. He is resistant to starting statin and given he really isnt diabetic at this time, we agreed that he could work on his diet and exercise and will start fish oil for now but may need statin in  future if any worsening. -He is overdue for his colon cancer screening as he did cologuard in 2018 but does state he had a colonoscopy more than 10years ago that had a polyp on it. Discussed options today and will move forward with colonoscopy via GI instead of cologuard.  Current Medication: Outpatient Encounter Medications as of 05/27/2021  Medication Sig   ACCU-CHEK GUIDE test strip USE AS DIRECTED DAILY   amLODipine-benazepril (LOTREL) 10-40 MG capsule Take 1 capsule by mouth daily.   buPROPion ER (WELLBUTRIN SR) 100 MG 12 hr tablet Take 1 tablet (100 mg total) by mouth daily.   celecoxib (CELEBREX) 200 MG capsule Take 1 capsule (200 mg total) by mouth 2 (two) times daily.   Dulaglutide (TRULICITY) 9.38 BO/1.7PZ SOPN Inject 0.75 mg into the skin once a week.   FLUoxetine (PROZAC) 20 MG tablet TAKE 3 TABLETS BY MOUTH EVERY DAY   [DISCONTINUED] amLODipine-benazepril (LOTREL) 10-20 MG capsule Take 1 capsule by mouth daily.   No facility-administered encounter medications on file as of 05/27/2021.    Surgical History: Past Surgical History:  Procedure Laterality Date   PROSTATE SURGERY     REPLACEMENT TOTAL KNEE Right    TONSILLECTOMY Bilateral     Medical History: Past Medical History:  Diagnosis Date   Cancer ([place])    prostate and skin   Hypertension    Low testosterone    Sleep apnea  Family History: Family History  Problem Relation Age of Onset   ALS Mother    Cancer Father    Cancer Paternal Grandfather       Review of Systems  Constitutional:  Negative for chills, fatigue and unexpected weight change.  HENT:  Negative for congestion, rhinorrhea, sneezing and sore throat.   Eyes:  Negative for redness.  Respiratory:  Negative for cough, chest tightness and shortness of breath.   Cardiovascular:  Negative for chest pain and palpitations.  Gastrointestinal:  Negative for abdominal pain, constipation, diarrhea, nausea and vomiting.  Genitourinary:  Negative  for dysuria and frequency.  Musculoskeletal:  Positive for arthralgias and back pain. Negative for joint swelling and neck pain.  Skin:  Negative for rash.  Neurological: Negative.  Negative for tremors and numbness.  Hematological:  Negative for adenopathy. Does not bruise/bleed easily.  Psychiatric/Behavioral:  Negative for behavioral problems (Depression), sleep disturbance and suicidal ideas. The patient is not nervous/anxious.     Vital Signs: BP (!) 168/91    Pulse 72    Temp 98.6 F (37 C)    Resp 16    Ht 6\' 2"  (1.88 m)    Wt 257 lb 9.6 oz (116.8 kg)    SpO2 98%    BMI 33.07 kg/m    Physical Exam Vitals and nursing note reviewed.  Constitutional:      General: He is not in acute distress.    Appearance: He is well-developed. He is obese. He is not diaphoretic.  HENT:     Head: Normocephalic and atraumatic.     Right Ear: External ear normal.     Left Ear: External ear normal.     Nose: Nose normal.     Mouth/Throat:     Pharynx: No oropharyngeal exudate.  Eyes:     General: No scleral icterus.       Right eye: No discharge.        Left eye: No discharge.     Conjunctiva/sclera: Conjunctivae normal.     Pupils: Pupils are equal, round, and reactive to light.  Neck:     Thyroid: No thyromegaly.     Vascular: No JVD.     Trachea: No tracheal deviation.  Cardiovascular:     Rate and Rhythm: Normal rate and regular rhythm.     Heart sounds: Normal heart sounds. No murmur heard.   No friction rub. No gallop.  Pulmonary:     Effort: Pulmonary effort is normal. No respiratory distress.     Breath sounds: Normal breath sounds. No stridor. No wheezing or rales.  Chest:     Chest wall: No tenderness.  Abdominal:     General: Bowel sounds are normal. There is no distension.     Palpations: Abdomen is soft. There is no mass.     Tenderness: There is no abdominal tenderness. There is no guarding or rebound.  Musculoskeletal:        General: No tenderness or deformity.  Normal range of motion.     Cervical back: Normal range of motion and neck supple.  Lymphadenopathy:     Cervical: No cervical adenopathy.  Skin:    General: Skin is warm and dry.     Coloration: Skin is not pale.     Findings: No erythema or rash.  Neurological:     Mental Status: He is alert.     Cranial Nerves: No cranial nerve deficit.     Motor: No abnormal muscle tone.  Coordination: Coordination normal.     Gait: Gait abnormal.     Deep Tendon Reflexes: Reflexes are normal and symmetric.     Comments: Pain in left knee leading to slight limp  Psychiatric:        Behavior: Behavior normal.        Thought Content: Thought content normal.        Judgment: Judgment normal.     LABS: Recent Results (from the past 2160 hour(s))  POCT HgB A1C     Status: None   Collection Time: 05/27/21 10:00 AM  Result Value Ref Range   Hemoglobin A1C 5.4 4.0 - 5.6 %   HbA1c POC (<> result, manual entry)     HbA1c, POC (prediabetic range)     HbA1c, POC (controlled diabetic range)          Assessment/Plan: 1. Encounter for general adult medical examination with abnormal findings CPE performed, labs reviewed, colonoscopy referral placed  2. Type 2 diabetes mellitus without complication, without long-term current use of insulin (HCC) - POCT HgB A1C is 5.4 and no longer in diabetic range. Patient may complete current trulicity pens given benefits with wt loss, but may discontinue after if sugars still well controlled  3. Essential hypertension Will increase lotrel and have pt log BP daily. He will call if not improving or any problems with med change - amLODipine-benazepril (LOTREL) 10-40 MG capsule; Take 1 capsule by mouth daily.  Dispense: 90 capsule; Refill: 1  4. Chronic pain of left knee Will refer to ortho for evaluation - AMB referral to orthopedics  5. Chronic bilateral low back pain without sciatica Possibly secondary to gait change from knee pain, will refer to ortho  for evaluation - AMB referral to orthopedics  6. OSA on CPAP Continue cpap nightly  7. Screening for colorectal cancer - Ambulatory referral to Gastroenterology  8. Dysuria - UA/M w/rflx Culture, Routine  9. Mixed hyperlipidemia Declines statin at this time. Will work on diet and exercise and start fish oil supplement and continue to monitor  General Counseling: seyon strader understanding of the findings of todays visit and agrees with plan of treatment. I have discussed any further diagnostic evaluation that may be needed or ordered today. We also reviewed his medications today. he has been encouraged to call the office with any questions or concerns that should arise related to todays visit.    Counseling:    Orders Placed This Encounter  Procedures   UA/M w/rflx Culture, Routine   AMB referral to orthopedics   Ambulatory referral to Gastroenterology   POCT HgB A1C    Meds ordered this encounter  Medications   amLODipine-benazepril (LOTREL) 10-40 MG capsule    Sig: Take 1 capsule by mouth daily.    Dispense:  90 capsule    Refill:  1    This patient was seen by Drema Dallas, PA-C in collaboration with Dr. Clayborn Bigness as a part of collaborative care agreement.  Total time spent:35 Minutes  Time spent includes review of chart, medications, test results, and follow up plan with the patient.     Lavera Guise, MD  Internal Medicine

## 2021-06-01 LAB — UA/M W/RFLX CULTURE, ROUTINE

## 2021-06-01 NOTE — Telephone Encounter (Signed)
Ortho scheduled 06/24/21 @ 10:00-[place]

## 2021-06-02 ENCOUNTER — Telehealth: Payer: Self-pay

## 2021-06-02 NOTE — Telephone Encounter (Signed)
error 

## 2021-06-03 ENCOUNTER — Telehealth: Payer: Self-pay

## 2021-06-03 NOTE — Telephone Encounter (Signed)
CALLED PATIENT NO ANSWER LEFT VOICEMAIL FOR A CALL BACK ? ?

## 2021-06-09 ENCOUNTER — Other Ambulatory Visit: Payer: Self-pay

## 2021-06-09 DIAGNOSIS — Z1211 Encounter for screening for malignant neoplasm of colon: Secondary | ICD-10-CM

## 2021-06-09 MED ORDER — NA SULFATE-K SULFATE-MG SULF 17.5-3.13-1.6 GM/177ML PO SOLN: 1.0000 | Freq: Once | ORAL | 0 refills | Status: AC

## 2021-06-09 NOTE — Progress Notes (Signed)
Gastroenterology Pre-Procedure Review  Request Date: 07/01/2021 Requesting Physician: Dr. Allen Norris  PATIENT REVIEW QUESTIONS: The patient responded to the following health history questions as indicated:    1. Are you having any GI issues? no 2. Do you have a personal history of Polyps? no 3. Do you have a family history of Colon Cancer or Polyps? no 4. Diabetes Mellitus?  Prediabetic 5. Joint replacements in the past 12 months?no 6. Major health problems in the past 3 months?no 7. Any artificial heart valves, MVP, or defibrillator?no    MEDICATIONS & ALLERGIES:    Patient reports the following regarding taking any anticoagulation/antiplatelet therapy:   Plavix, Coumadin, Eliquis, Xarelto, Lovenox, Pradaxa, Brilinta, or Effient? no Aspirin? no  Patient confirms/reports the following medications:  Current Outpatient Medications  Medication Sig Dispense Refill   ACCU-CHEK GUIDE test strip USE AS DIRECTED DAILY 100 strip 1   amLODipine-benazepril (LOTREL) 10-40 MG capsule Take 1 capsule by mouth daily. 90 capsule 1   buPROPion ER (WELLBUTRIN SR) 100 MG 12 hr tablet Take 1 tablet (100 mg total) by mouth daily. 90 tablet 1   celecoxib (CELEBREX) 200 MG capsule Take 1 capsule (200 mg total) by mouth 2 (two) times daily. 30 capsule 0   Dulaglutide (TRULICITY) 9.15 WC/1.3SC SOPN Inject 0.75 mg into the skin once a week. 6 mL 3   FLUoxetine (PROZAC) 20 MG tablet TAKE 3 TABLETS BY MOUTH EVERY DAY 270 tablet 1   No current facility-administered medications for this visit.    Patient confirms/reports the following allergies:  No Known Allergies  No orders of the defined types were placed in this encounter.   AUTHORIZATION INFORMATION Primary Insurance: 1D#: Group #:  Secondary Insurance: 1D#: Group #:  SCHEDULE INFORMATION: Date: 07/01/2021 Time: Location: [place]

## 2021-06-17 ENCOUNTER — Encounter: Payer: Self-pay | Admitting: Physician Assistant

## 2021-06-17 ENCOUNTER — Telehealth: Payer: 59 | Admitting: Physician Assistant

## 2021-06-17 VITALS — BP 145/78 | Ht 74.0 in | Wt 253.0 lb

## 2021-06-17 DIAGNOSIS — J069 Acute upper respiratory infection, unspecified: Secondary | ICD-10-CM

## 2021-06-17 MED ORDER — AZITHROMYCIN 250 MG PO TABS: ORAL_TABLET | ORAL | 0 refills | Status: AC

## 2021-06-17 NOTE — Progress Notes (Signed)
[facility] [place], [place] [address]  Internal MEDICINE  Telephone Visit  Patient Name: [name]  [id number]  [id number]  Date of Service: 06/21/2021  I connected with the patient at 1:45 by telephone and verified the patients identity using two identifiers.   I discussed the limitations, risks, security and privacy concerns of performing an evaluation and management service by telephone and the availability of in person appointments. I also discussed with the patient that there may be a patient responsible charge related to the service.  The patient expressed understanding and agrees to proceed.    Chief Complaint  Patient presents with   Telephone Assessment    [phone]   Telephone Screen    Covid test is negative    Sinusitis   Cough    Going on for 5 days     HPI Pt is here for virtual sick visit -For the past week he has been coughing up mucus with sinus congestion. A little but of wheeze. Denies fever, chills, bodyaches. -His covid test was negative -He has tried mucinex-DM, flonase -Does note his BP has been better, 140/70s at home  Current Medication: Outpatient Encounter Medications as of 06/17/2021  Medication Sig   ACCU-CHEK GUIDE test strip USE AS DIRECTED DAILY   amLODipine-benazepril (LOTREL) 10-40 MG capsule Take 1 capsule by mouth daily.   azithromycin (ZITHROMAX) 250 MG tablet Take 2 tablets on day 1, then 1 tablet daily on days 2 through 5   buPROPion ER (WELLBUTRIN SR) 100 MG 12 hr tablet Take 1 tablet (100 mg total) by mouth daily.   celecoxib (CELEBREX) 200 MG capsule Take 1 capsule (200 mg total) by mouth 2 (two) times daily.   Dulaglutide (TRULICITY) 5.17 GY/1.7CB SOPN Inject 0.75 mg into the skin once a week.   FLUoxetine (PROZAC) 20 MG tablet TAKE 3 TABLETS BY MOUTH EVERY DAY   No facility-administered encounter medications on file as of 06/17/2021.    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   Hypertension    Low testosterone    Sleep apnea     Family History: Family History  Problem Relation Age of Onset   ALS Mother    Cancer Father    Cancer Paternal Grandfather     Social History   Socioeconomic History   Marital status: Married    Spouse name: Not on file   Number of children: Not on file   Years of education: Not on file   Highest education level: Not on file  Occupational History   Not on file  Tobacco Use   Smoking status: Former   Smokeless tobacco: Former    Types: Chew  Substance and Sexual Activity   Alcohol use: Yes    Comment: social   Drug use: No   Sexual activity: Not on file  Other Topics Concern   Not on file  Social History Narrative   Not on file   Social Determinants of Health   Financial Resource Strain: Not on file  Food Insecurity: Not on file  Transportation Needs: Not on file  Physical Activity: Not on file  Stress: Not on file  Social Connections: Not on file  Intimate Partner Violence: Not on file  Review of Systems  Constitutional:  Negative for fatigue and fever.  HENT:  Positive for congestion and postnasal drip. Negative for mouth sores.   Respiratory:  Positive for cough and wheezing. Negative for shortness of breath.   Cardiovascular:  Negative for chest pain.  Genitourinary:  Negative for flank pain.  Psychiatric/Behavioral: Negative.     Vital Signs: BP (!) 145/78    Ht 6\' 2"  (1.88 m)    Wt 253 lb (114.8 kg)    BMI 32.48 kg/m    Observation/Objective:   Pt is able to carry out conversation  Assessment/Plan: 1. Upper respiratory tract infection, unspecified type Will start on zpak and may continue mucinex and flonase. Advised to rest and stay well hydrated - azithromycin (ZITHROMAX) 250 MG tablet;  Take 2 tablets on day 1, then 1 tablet daily on days 2 through 5  Dispense: 6 tablet; Refill: 0    General Counseling: [name] verbalizes understanding of the findings of today's phone visit and agrees with plan of treatment. I have discussed any further diagnostic evaluation that may be needed or ordered today. We also reviewed his medications today. he has been encouraged to call the office with any questions or concerns that should arise related to todays visit.    No orders of the defined types were placed in this encounter.   Meds ordered this encounter  Medications   azithromycin (ZITHROMAX) 250 MG tablet    Sig: Take 2 tablets on day 1, then 1 tablet daily on days 2 through 5    Dispense:  6 tablet    Refill:  0    Time spent:25 Minutes    Dr Lavera Guise Internal medicine

## 2021-06-30 ENCOUNTER — Encounter: Payer: Self-pay | Admitting: Gastroenterology

## 2021-07-01 ENCOUNTER — Encounter: Payer: Self-pay | Admitting: Gastroenterology

## 2021-07-01 ENCOUNTER — Ambulatory Visit
Admission: RE | Admit: 2021-07-01 | Discharge: 2021-07-01 | Disposition: A | Discharge status: Home / Self Care | Payer: 59 | Attending: Gastroenterology | Admitting: Gastroenterology

## 2021-07-01 ENCOUNTER — Ambulatory Visit: Payer: 59 | Admitting: Anesthesiology

## 2021-07-01 ENCOUNTER — Encounter
Admission: RE | Disposition: A | Discharge status: Home / Self Care | Payer: Self-pay | Source: Home / Self Care | Attending: Gastroenterology

## 2021-07-01 DIAGNOSIS — Z87891 Personal history of nicotine dependence: Secondary | ICD-10-CM | POA: Insufficient documentation

## 2021-07-01 DIAGNOSIS — E669 Obesity, unspecified: Secondary | ICD-10-CM | POA: Insufficient documentation

## 2021-07-01 DIAGNOSIS — Z6831 Body mass index (BMI) 31.0-31.9, adult: Secondary | ICD-10-CM | POA: Diagnosis not present

## 2021-07-01 DIAGNOSIS — K5730 Diverticulosis of large intestine without perforation or abscess without bleeding: Secondary | ICD-10-CM | POA: Insufficient documentation

## 2021-07-01 DIAGNOSIS — Z79899 Other long term (current) drug therapy: Secondary | ICD-10-CM | POA: Insufficient documentation

## 2021-07-01 DIAGNOSIS — I10 Essential (primary) hypertension: Secondary | ICD-10-CM | POA: Insufficient documentation

## 2021-07-01 DIAGNOSIS — R7303 Prediabetes: Secondary | ICD-10-CM | POA: Insufficient documentation

## 2021-07-01 DIAGNOSIS — F32A Depression, unspecified: Secondary | ICD-10-CM | POA: Diagnosis not present

## 2021-07-01 DIAGNOSIS — Z1211 Encounter for screening for malignant neoplasm of colon: Secondary | ICD-10-CM | POA: Insufficient documentation

## 2021-07-01 DIAGNOSIS — K640 First degree hemorrhoids: Secondary | ICD-10-CM | POA: Diagnosis not present

## 2021-07-01 DIAGNOSIS — G4730 Sleep apnea, unspecified: Secondary | ICD-10-CM | POA: Insufficient documentation

## 2021-07-01 HISTORY — PX: COLONOSCOPY WITH PROPOFOL: SHX5780

## 2021-07-01 SURGERY — COLONOSCOPY WITH PROPOFOL
Anesthesia: General

## 2021-07-01 MED ORDER — PROPOFOL 10 MG/ML IV BOLUS
INTRAVENOUS | Status: DC | PRN
Start: 2021-07-01 — End: 2021-07-01
  Administered 2021-07-01: 100 mg via INTRAVENOUS

## 2021-07-01 MED ORDER — SODIUM CHLORIDE 0.9 % IV SOLN: INTRAVENOUS | Status: DC

## 2021-07-01 MED ORDER — PROPOFOL 500 MG/50ML IV EMUL
INTRAVENOUS | Status: DC | PRN
  Administered 2021-07-01: 200 ug/kg/min via INTRAVENOUS

## 2021-07-01 NOTE — Anesthesia Procedure Notes (Signed)
Date/Time: 07/01/2021 8:42 AM Performed by: Nelda Marseille, CRNA Pre-anesthesia Checklist: Patient identified, Emergency Drugs available, Suction available, Patient being monitored and Timeout performed Oxygen Delivery Method: Nasal cannula

## 2021-07-01 NOTE — Transfer of Care (Signed)
Immediate Anesthesia Transfer of Care Note  Patient: [name]  Procedure(s) Performed: COLONOSCOPY WITH PROPOFOL  Patient Location: PACU  Anesthesia Type:General  Level of Consciousness: sedated  Airway & Oxygen Therapy: Patient Spontanous Breathing and Patient connected to nasal cannula oxygen  Post-op Assessment: Report given to RN and Post -op Vital signs reviewed and stable  Post vital signs: Reviewed and stable  Last Vitals:  Vitals Value Taken Time  BP 119/70 07/01/21 0850  Temp 36.3 C 07/01/21 0850  Pulse 72 07/01/21 0853  Resp 19 07/01/21 0853  SpO2 94 % 07/01/21 0853  Vitals shown include unvalidated device data.  Last Pain:  Vitals:   07/01/21 0850  TempSrc: Temporal  PainSc: Asleep         Complications: No notable events documented.

## 2021-07-01 NOTE — H&P (Signed)
[name], [name] [name]., [name], [name] [address] Phone: [phone] Fax : [phone]  Primary Care Physician:  [name], [name] Primary Gastroenterologist:  Dr. Allen Coleman  Pre-Procedure History & Physical: HPI:  [name] is a 66 y.o. male is here for a screening colonoscopy.   Past Medical History:  Diagnosis Date   Cancer [name])    prostate and skin   Hypertension    Low testosterone    Sleep apnea     Past Surgical History:  Procedure Laterality Date   JOINT REPLACEMENT     PROSTATE SURGERY     REPLACEMENT TOTAL KNEE Right    TONSILLECTOMY Bilateral     Prior to Admission medications   Medication Sig Start Date End Date Taking? Authorizing Provider  amLODipine-benazepril (LOTREL) 10-40 MG capsule Take 1 capsule by mouth daily. 05/27/21  Yes [name], [name], [name]  buPROPion ER (WELLBUTRIN SR) 100 MG 12 hr tablet Take 1 tablet (100 mg total) by mouth daily. 01/28/21  Yes [name], [name]  celecoxib (CELEBREX) 200 MG capsule Take 1 capsule (200 mg total) by mouth 2 (two) times daily. 12/17/20  Yes [name], Devin Fairly, [name]  FLUoxetine (PROZAC) 20 MG tablet TAKE 3 TABLETS BY MOUTH EVERY DAY 01/22/21  Yes [name], [name]  ACCU-CHEK GUIDE test strip USE AS DIRECTED DAILY 04/26/21   [name], [name]  Dulaglutide (TRULICITY) 4.62 VO/3.5KK SOPN Inject 0.75 mg into the skin once a week. 01/18/21   [name], [name]    Allergies as of 06/09/2021   (No Known Allergies)    Family History  Problem Relation Age of Onset   ALS Mother    Cancer Father    Cancer Paternal Grandfather     Social History   Socioeconomic History   Marital status: Married    Spouse name: Not on file   Number of children: Not on file   Years of education: Not on file   Highest education level: Not on file  Occupational History   Not on file  Tobacco Use   Smoking status: Former   Smokeless tobacco: Former    Types: Nurse, children's Use: Never used   Substance and Sexual Activity   Alcohol use: Yes    Comment: social   Drug use: No   Sexual activity: Not on file  Other Topics Concern   Not on file  Social History Narrative   Not on file   Social Determinants of Coleman   Financial Resource Strain: Not on file  Food Insecurity: Not on file  Transportation Needs: Not on file  Physical Activity: Not on file  Stress: Not on file  Social Connections: Not on file  Intimate Partner Violence: Not on file    Review of Systems: See HPI, otherwise negative ROS  Physical Exam: BP (!) 128/93    Pulse 77    Temp (!) 97.5 F (36.4 C) (Temporal)    Resp 17    Ht 6\' 2"  (1.88 m)    Wt 112.5 kg    SpO2 99%    BMI 31.84 kg/m  General:   Alert,  pleasant and cooperative in NAD Head:  Normocephalic and atraumatic. Neck:  Supple; no masses or thyromegaly. Lungs:  Clear throughout to auscultation.    Heart:  Regular rate and rhythm. Abdomen:  Soft, nontender and nondistended. Normal bowel sounds, without guarding, and without rebound.   Neurologic:  Alert and  oriented x4;  grossly normal neurologically.  Impression/Plan: [name] is now here to undergo a screening colonoscopy.  Risks, benefits, and alternatives regarding colonoscopy have been reviewed with the patient.  Questions have been answered.  All parties agreeable.

## 2021-07-01 NOTE — Op Note (Signed)
[facility] Gastroenterology Patient Name: [name] Procedure Date: 07/01/2021 8:22 AM MRN: [id number] Account #: [id number] Date of Birth: 11/28/1955 Admit Type: Outpatient Age: 66 Room: [facility] ENDO ROOM 4 Gender: Male Note Status: Finalized Instrument Name: Park Meo [id number] Procedure:             Colonoscopy Indications:           Screening for colorectal malignant neoplasm Providers:             Lucilla Lame MD, MD Referring MD:          Lavera Guise, MD (Referring MD) Medicines:             Propofol per Anesthesia Complications:         No immediate complications. Procedure:             Pre-Anesthesia Assessment:                        - Prior to the procedure, a History and Physical was                         performed, and patient medications and allergies were                         reviewed. The patient's tolerance of previous                         anesthesia was also reviewed. The risks and benefits                         of the procedure and the sedation options and risks                         were discussed with the patient. All questions were                         answered, and informed consent was obtained. Prior                         Anticoagulants: The patient has taken no previous                         anticoagulant or antiplatelet agents. ASA Grade                         Assessment: II - A patient with mild systemic disease.                         After reviewing the risks and benefits, the patient                         was deemed in satisfactory condition to undergo the                         procedure.                        After obtaining informed consent, the colonoscope was  passed under direct vision. Throughout the procedure,                         the patient's blood pressure, pulse, and oxygen                         saturations were monitored continuously. The                          Colonoscope was introduced through the anus and                         advanced to the the cecum, identified by appendiceal                         orifice and ileocecal valve. The colonoscopy was                         performed without difficulty. The patient tolerated                         the procedure well. The quality of the bowel                         preparation was excellent. Findings:      The perianal and digital rectal examinations were normal.      A few small-mouthed diverticula were found in the sigmoid colon.      Non-bleeding internal hemorrhoids were found during retroflexion. The       hemorrhoids were Grade I (internal hemorrhoids that do not prolapse). Impression:            - Diverticulosis in the sigmoid colon.                        - Non-bleeding internal hemorrhoids.                        - No specimens collected. Recommendation:        - Discharge patient to home.                        - Resume previous diet.                        - Continue present medications.                        - Repeat colonoscopy in 10 years for screening                         purposes. Procedure Code(s):     --- Professional ---                        [phone], Colonoscopy, flexible; diagnostic, including                         collection of specimen(s) by brushing or washing, when                         performed (separate procedure)  Diagnosis Code(s):     --- Professional ---                        Z12.11, Encounter for screening for malignant neoplasm                         of colon CPT copyright 2019 American Medical Association. All rights reserved. The codes documented in this report are preliminary and upon coder review may  be revised to meet current compliance requirements. Lucilla Lame MD, MD 07/01/2021 8:47:59 AM This report has been signed electronically. Number of Addenda: 0 Note Initiated On: 07/01/2021 8:22 AM Scope Withdrawal Time: 0 hours 7 minutes 28  seconds  Total Procedure Duration: 0 hours 11 minutes 56 seconds  Estimated Blood Loss:  Estimated blood loss: none.      [facility]

## 2021-07-01 NOTE — Anesthesia Preprocedure Evaluation (Addendum)
Anesthesia Evaluation  Patient identified by MRN, date of birth, ID band Patient awake    Reviewed: Allergy & Precautions, NPO status , Patient's Chart, lab work & pertinent test results  History of Anesthesia Complications Negative for: history of anesthetic complications  Airway Mallampati: II   Neck ROM: Full    Dental no notable dental hx.    Pulmonary sleep apnea , former smoker (quit greater than 30 years ago),    Pulmonary exam normal breath sounds clear to auscultation       Cardiovascular hypertension, Normal cardiovascular exam Rhythm:Regular Rate:Normal     Neuro/Psych PSYCHIATRIC DISORDERS Depression Chronic pain    GI/Hepatic negative GI ROS,   Endo/Other  Obesity, prediabetes  Renal/GU negative Renal ROS     Musculoskeletal   Abdominal   Peds  Hematology Prostate and skin CA   Anesthesia Other Findings   Reproductive/Obstetrics                            Anesthesia Physical Anesthesia Plan  ASA: 2  Anesthesia Plan: General   Post-op Pain Management:    Induction: Intravenous  PONV Risk Score and Plan: 2 and Propofol infusion, TIVA and Treatment may vary due to age or medical condition  Airway Management Planned: Natural Airway  Additional Equipment:   Intra-op Plan:   Post-operative Plan:   Informed Consent: I have reviewed the patients History and Physical, chart, labs and discussed the procedure including the risks, benefits and alternatives for the proposed anesthesia with the patient or authorized representative who has indicated his/her understanding and acceptance.       Plan Discussed with: CRNA  Anesthesia Plan Comments: (LMA/GETA backup discussed.  Patient consented for risks of anesthesia including but not limited to:  - adverse reactions to medications - damage to eyes, teeth, lips or other oral mucosa - nerve damage due to positioning  - sore  throat or hoarseness - damage to heart, brain, nerves, lungs, other parts of body or loss of life  Informed patient about role of CRNA in peri- and intra-operative care.  Patient voiced understanding.)        Anesthesia Quick Evaluation

## 2021-07-01 NOTE — Anesthesia Postprocedure Evaluation (Signed)
Anesthesia Post Note  Patient: [name]  Procedure(s) Performed: COLONOSCOPY WITH PROPOFOL  Patient location during evaluation: PACU Anesthesia Type: General Level of consciousness: awake and alert, oriented and patient cooperative Pain management: pain level controlled Vital Signs Assessment: post-procedure vital signs reviewed and stable Respiratory status: spontaneous breathing, nonlabored ventilation and respiratory function stable Cardiovascular status: blood pressure returned to baseline and stable Postop Assessment: adequate PO intake Anesthetic complications: no   No notable events documented.   Last Vitals:  Vitals:   07/01/21 0900 07/01/21 0910  BP: 118/80 116/83  Pulse: 67 62  Resp: 17 18  Temp:    SpO2: 95% 97%    Last Pain:  Vitals:   07/01/21 0910  TempSrc:   PainSc: 0-No pain                 Darrin Nipper

## 2021-07-02 ENCOUNTER — Encounter: Payer: Self-pay | Admitting: Gastroenterology

## 2021-07-23 ENCOUNTER — Other Ambulatory Visit: Payer: Self-pay | Admitting: Internal Medicine

## 2021-07-23 DIAGNOSIS — F339 Major depressive disorder, recurrent, unspecified: Secondary | ICD-10-CM

## 2021-08-07 ENCOUNTER — Other Ambulatory Visit: Payer: Self-pay | Admitting: Internal Medicine

## 2021-08-07 DIAGNOSIS — F339 Major depressive disorder, recurrent, unspecified: Secondary | ICD-10-CM

## 2021-08-26 ENCOUNTER — Ambulatory Visit: Payer: Medicare Other | Admitting: Physician Assistant

## 2021-08-26 ENCOUNTER — Encounter: Payer: Self-pay | Admitting: Physician Assistant

## 2021-08-26 ENCOUNTER — Other Ambulatory Visit: Payer: Self-pay

## 2021-08-26 DIAGNOSIS — I10 Essential (primary) hypertension: Secondary | ICD-10-CM | POA: Diagnosis not present

## 2021-08-26 DIAGNOSIS — E119 Type 2 diabetes mellitus without complications: Secondary | ICD-10-CM

## 2021-08-26 DIAGNOSIS — G4733 Obstructive sleep apnea (adult) (pediatric): Secondary | ICD-10-CM

## 2021-08-26 DIAGNOSIS — M25562 Pain in left knee: Secondary | ICD-10-CM

## 2021-08-26 DIAGNOSIS — Z9989 Dependence on other enabling machines and devices: Secondary | ICD-10-CM

## 2021-08-26 DIAGNOSIS — G8929 Other chronic pain: Secondary | ICD-10-CM

## 2021-08-26 MED ORDER — AMLODIPINE BESY-BENAZEPRIL HCL 10-40 MG PO CAPS: 1.0000 | ORAL_CAPSULE | Freq: Every day | ORAL | 1 refills | Status: DC

## 2021-08-26 NOTE — Progress Notes (Signed)
[facility] ?[address] ?[place], [place] [address] ? ?Internal MEDICINE  ?Office Visit Note ? ?Patient Name: [name] ? [id number]  ?[id number] ? ?Date of Service: 08/31/2021 ? ?Chief Complaint  ?Patient presents with  ? Follow-up  ? Hypertension  ? Quality Metric Gaps  ?  Shingles and Pneumonia  ? ? ?HPI ?Pt is here for routine follow up ?-Left knee replacement is set for July 5th. ?-Does still have some low back pain at times ?-BP at home 140/90s, states he gets nervous in office and was a little flustered coming in today which is why it is elevated in office. He states it had been doing well since adjust BP meds last visit. ?-BG 90s, still has 1 month of trulicity left and states he wants to finish it out for additional wt loss benefits  ?-Had his colonoscopy ? ?Current Medication: ?Outpatient Encounter Medications as of 08/26/2021  ?Medication Sig  ? ACCU-CHEK GUIDE test strip USE AS DIRECTED DAILY  ? buPROPion ER (WELLBUTRIN SR) 100 MG 12 hr tablet TAKE 1 TABLET BY MOUTH EVERY DAY  ? celecoxib (CELEBREX) 200 MG capsule Take 1 capsule (200 mg total) by mouth 2 (two) times daily.  ? Dulaglutide (TRULICITY) 6.76 HM/0.9OB SOPN Inject 0.75 mg into the skin once a week.  ? FLUoxetine (PROZAC) 20 MG tablet TAKE 3 TABLETS BY MOUTH EVERY DAY  ? [DISCONTINUED] amLODipine-benazepril (LOTREL) 10-40 MG capsule Take 1 capsule by mouth daily.  ? amLODipine-benazepril (LOTREL) 10-40 MG capsule Take 1 capsule by mouth daily.  ? ?No facility-administered encounter medications on file as of 08/26/2021.  ? ? ?Surgical History: ?Past Surgical History:  ?Procedure Laterality Date  ? COLONOSCOPY WITH PROPOFOL N/A 07/01/2021  ? Procedure: COLONOSCOPY WITH PROPOFOL;  Surgeon: Lucilla Lame, MD;  Location[facility] ENDOSCOPY;  Service: Endoscopy;  Laterality: N/A;  ? JOINT REPLACEMENT    ? PROSTATE SURGERY    ? REPLACEMENT TOTAL KNEE Right   ? TONSILLECTOMY Bilateral   ? ? ?Medical History: ?Past Medical History:  ?Diagnosis  Date  ? Cancer [facility])   ? prostate and skin  ? Hypertension   ? Low testosterone   ? Sleep apnea   ? ? ?Family History: ?Family History  ?Problem Relation Age of Onset  ? ALS Mother   ? Cancer Father   ? Cancer Paternal Grandfather   ? ? ?Social History  ? ?Socioeconomic History  ? Marital status: Married  ?  Spouse name: Not on file  ? Number of children: Not on file  ? Years of education: Not on file  ? Highest education level: Not on file  ?Occupational History  ? Not on file  ?Tobacco Use  ? Smoking status: Former  ? Smokeless tobacco: Former  ?  Types: Chew  ?Vaping Use  ? Vaping Use: Never used  ?Substance and Sexual Activity  ? Alcohol use: Yes  ?  Comment: social  ? Drug use: No  ? Sexual activity: Not on file  ?Other Topics Concern  ? Not on file  ?Social History Narrative  ? Not on file  ? ?Social Determinants of Health  ? ?Financial Resource Strain: Not on file  ?Food Insecurity: Not on file  ?Transportation Needs: Not on file  ?Physical Activity: Not on file  ?Stress: Not on file  ?Social Connections: Not on file  ?Intimate Partner Violence: Not on file  ? ? ? ? ?Review of Systems  ?Constitutional:  Negative for chills, fatigue and unexpected weight change.  ?  HENT:  Negative for congestion, rhinorrhea, sneezing and sore throat.   ?Eyes:  Negative for redness.  ?Respiratory:  Negative for cough, chest tightness and shortness of breath.   ?Cardiovascular:  Negative for chest pain and palpitations.  ?Gastrointestinal:  Negative for abdominal pain, constipation, diarrhea, nausea and vomiting.  ?Genitourinary:  Negative for dysuria and frequency.  ?Musculoskeletal:  Positive for arthralgias and back pain. Negative for joint swelling and neck pain.  ?Skin:  Negative for rash.  ?Neurological: Negative.  Negative for tremors and numbness.  ?Hematological:  Negative for adenopathy. Does not bruise/bleed easily.  ?Psychiatric/Behavioral:  Negative for behavioral problems (Depression), sleep disturbance and suicidal  ideas. The patient is not nervous/anxious.   ? ?Vital Signs: ?BP (!) 150/92 Comment: 159/98  Pulse 79   Temp 98.1 ?F (36.7 ?C)   Resp 16   Ht '6\' 2"'$  (1.88 m)   Wt 254 lb 9.6 oz (115.5 kg)   SpO2 97%   BMI 32.69 kg/m?  ? ? ?Physical Exam ?Vitals and nursing note reviewed.  ?Constitutional:   ?   General: He is not in acute distress. ?   Appearance: He is well-developed. He is obese. He is not diaphoretic.  ?HENT:  ?   Head: Normocephalic and atraumatic.  ?   Right Ear: External ear normal.  ?   Left Ear: External ear normal.  ?   Nose: Nose normal.  ?   Mouth/Throat:  ?   Pharynx: No oropharyngeal exudate.  ?Eyes:  ?   General: No scleral icterus. ?   Conjunctiva/sclera: Conjunctivae normal.  ?   Pupils: Pupils are equal, round, and reactive to light.  ?Neck:  ?   Thyroid: No thyromegaly.  ?   Vascular: No JVD.  ?   Trachea: No tracheal deviation.  ?Cardiovascular:  ?   Rate and Rhythm: Normal rate and regular rhythm.  ?   Heart sounds: Normal heart sounds. No murmur heard. ?  No friction rub. No gallop.  ?Pulmonary:  ?   Effort: Pulmonary effort is normal. No respiratory distress.  ?   Breath sounds: Normal breath sounds. No stridor. No wheezing or rales.  ?Chest:  ?   Chest wall: No tenderness.  ?Abdominal:  ?   General: Bowel sounds are normal.  ?   Palpations: Abdomen is soft.  ?Musculoskeletal:     ?   General: No tenderness or deformity. Normal range of motion.  ?   Cervical back: Normal range of motion and neck supple.  ?Lymphadenopathy:  ?   Cervical: No cervical adenopathy.  ?Skin: ?   General: Skin is warm and dry.  ?   Coloration: Skin is not pale.  ?   Findings: No erythema or rash.  ?Neurological:  ?   Mental Status: He is alert.  ?   Cranial Nerves: No cranial nerve deficit.  ?   Motor: No abnormal muscle tone.  ?   Coordination: Coordination normal.  ?   Gait: Gait abnormal.  ?   Deep Tendon Reflexes: Reflexes are normal and symmetric.  ?   Comments: Pain in left knee leading to slight limp   ?Psychiatric:     ?   Behavior: Behavior normal.     ?   Thought Content: Thought content normal.     ?   Judgment: Judgment normal.  ? ? ? ? ? ?Assessment/Plan: ?1. Essential hypertension ?Elevate din office, but reports good readings at home and declines further med changes today. Will continue to  monitor closely as this is likely due to white coat syndrome. Will continue current medication, but pt will call if BP not controlled at home ?- amLODipine-benazepril (LOTREL) 10-40 MG capsule; Take 1 capsule by mouth daily.  Dispense: 90 capsule; Refill: 1 ? ?2. Type 2 diabetes mellitus without complication, without long-term current use of insulin ([place]) ?BG stable and A1c back in normal range last 2 checks. May complete remaining trulicity and continue to work on diet and exercise ? ?3. Chronic pain of left knee ?Followed by ortho with replacement planned for July ? ?4. OSA on CPAP ?Continue cpap nightly ? ? ?General Counseling: baldo hufnagle understanding of the findings of todays visit and agrees with plan of treatment. I have discussed any further diagnostic evaluation that may be needed or ordered today. We also reviewed his medications today. he has been encouraged to call the office with any questions or concerns that should arise related to todays visit. ? ? ? ?No orders of the defined types were placed in this encounter. ? ? ?Meds ordered this encounter  ?Medications  ? amLODipine-benazepril (LOTREL) 10-40 MG capsule  ?  Sig: Take 1 capsule by mouth daily.  ?  Dispense:  90 capsule  ?  Refill:  1  ? ? ?This patient was seen by Drema Dallas, PA-C in collaboration with Dr. Clayborn Bigness as a part of collaborative care agreement. ? ? ?Total time spent:30 Minutes ?Time spent includes review of chart, medications, test results, and follow up plan with the patient.  ? ? ? ? ?Dr Lavera Guise ?Internal medicine  ?

## 2021-11-04 ENCOUNTER — Telehealth: Payer: Self-pay

## 2021-11-04 ENCOUNTER — Encounter: Payer: Self-pay | Admitting: Physician Assistant

## 2021-11-04 NOTE — Telephone Encounter (Signed)
Lvm to schedule virtual appointment-Toni

## 2021-11-05 ENCOUNTER — Telehealth (INDEPENDENT_AMBULATORY_CARE_PROVIDER_SITE_OTHER): Payer: Medicare Other | Admitting: Physician Assistant

## 2021-11-05 VITALS — BP 138/83 | Resp 16 | Ht 74.0 in | Wt 252.0 lb

## 2021-11-05 DIAGNOSIS — J069 Acute upper respiratory infection, unspecified: Secondary | ICD-10-CM

## 2021-11-05 MED ORDER — AZITHROMYCIN 250 MG PO TABS
ORAL_TABLET | ORAL | 0 refills | Status: AC
Start: 2021-11-05 — End: 2021-11-10

## 2021-11-05 NOTE — Progress Notes (Signed)
[facility] [place], [place] [address]  Internal MEDICINE  Telephone Visit  Patient Name: [name]  [id number]  [id number]  Date of Service: 11/05/2021  I connected with the patient at 9:56 by telephone and verified the patients identity using two identifiers.   I discussed the limitations, risks, security and privacy concerns of performing an evaluation and management service by telephone and the availability of in person appointments. I also discussed with the patient that there may be a patient responsible charge related to the service.  The patient expressed understanding and agrees to proceed.    Chief Complaint  Patient presents with   Telephone Assessment    [phone]   Telephone Screen   Cough    Productive greenish mucus.  X 2 weeks.  Hacky cough   Fatigue    HPI Pt is here for a virtual sick visit -He has been experiencing a productive cough with green mucus for 2 weeks -has tried nasal spray and cough decongestant that helps with sinus congestion and breaks up mucus -Denies wheezing or SOB, does feel fatigued  Current Medication: Outpatient Encounter Medications as of 11/05/2021  Medication Sig   ACCU-CHEK GUIDE test strip USE AS DIRECTED DAILY   amLODipine-benazepril (LOTREL) 10-40 MG capsule Take 1 capsule by mouth daily.   azithromycin (ZITHROMAX) 250 MG tablet Take 2 tablets on day 1, then 1 tablet daily on days 2 through 5   buPROPion ER (WELLBUTRIN SR) 100 MG 12 hr tablet TAKE 1 TABLET BY MOUTH EVERY DAY   celecoxib (CELEBREX) 200 MG capsule Take 1 capsule (200 mg total) by mouth 2 (two) times daily.   Dulaglutide (TRULICITY) 7.89 FY/1.0FB SOPN Inject 0.75 mg into the skin once a week.   FLUoxetine (PROZAC) 20 MG tablet TAKE 3 TABLETS BY MOUTH EVERY DAY   No facility-administered encounter medications on file as of 11/05/2021.    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   Hypertension    Low testosterone    Sleep apnea     Family History: Family History  Problem Relation Age of Onset   ALS Mother    Cancer Father    Cancer Paternal Grandfather     Social History   Socioeconomic History   Marital status: Married    Spouse name: Not on file   Number of children: Not on file   Years of education: Not on file   Highest education level: Not on file  Occupational History   Not on file  Tobacco Use   Smoking status: Former   Smokeless tobacco: Former    Types: Nurse, children's Use: Never used  Substance and Sexual Activity   Alcohol use: Yes    Comment: social   Drug use: No   Sexual activity: Not on file  Other Topics Concern   Not on file  Social History Narrative   Not on file   Social Determinants of Health   Financial Resource Strain: Not on file  Food Insecurity: Not on file  Transportation Needs: Not on file  Physical Activity: Not on file  Stress: Not on file  Social Connections: Not on file  Intimate Partner Violence: Not on file      Review of Systems  Constitutional:  Positive for fatigue. Negative for fever.  HENT:  Positive for congestion and postnasal drip. Negative for mouth sores.   Respiratory:  Positive for cough. Negative for shortness of breath and wheezing.   Cardiovascular:  Negative for chest pain.  Genitourinary:  Negative for flank pain.  Psychiatric/Behavioral: Negative.     Vital Signs: BP 138/83   Resp 16   Ht '6\' 2"'$  (1.88 m)   Wt 252 lb (114.3 kg)   BMI 32.35 kg/m    Observation/Objective:  Pt is able to carry out  conversation   Assessment/Plan: 1. Upper respiratory tract infection, unspecified type Will start on zpak and continue nasal spray, decongestant and mucinex as needed. Advised to rest and stay well hydrated - azithromycin (ZITHROMAX) 250 MG tablet; Take 2 tablets on day 1, then 1 tablet daily on days 2 through 5  Dispense: 6 tablet; Refill: 0   General Counseling: Kahari verbalizes understanding of the findings of today's phone visit and agrees with plan of treatment. I have discussed any further diagnostic evaluation that may be needed or ordered today. We also reviewed his medications today. he has been encouraged to call the office with any questions or concerns that should arise related to todays visit.    No orders of the defined types were placed in this encounter.   Meds ordered this encounter  Medications   azithromycin (ZITHROMAX) 250 MG tablet    Sig: Take 2 tablets on day 1, then 1 tablet daily on days 2 through 5    Dispense:  6 tablet    Refill:  0    Time spent:25 Minutes    Dr Lavera Guise Internal medicine

## 2021-11-17 NOTE — Discharge Instructions (Signed)
Instructions after Total Knee Replacement   [name], Jr., M.D.     Dept. of Orthopaedics & Sports Medicine  [facility]  [address]  [place], [place]  [address]  Phone: [phone]   Fax: [phone]    DIET: Drink plenty of non-alcoholic fluids. Resume your normal diet. Include foods high in fiber.  ACTIVITY:  You may use crutches or a walker with weight-bearing as tolerated, unless instructed otherwise. You may be weaned off of the walker or crutches by your Physical Therapist.  Do NOT place pillows under the knee. Anything placed under the knee could limit your ability to straighten the knee.   Continue doing gentle exercises. Exercising will reduce the pain and swelling, increase motion, and prevent muscle weakness.   Please continue to use the TED compression stockings for 6 weeks. You may remove the stockings at night, but should reapply them in the morning. Do not drive or operate any equipment until instructed.  WOUND CARE:  Continue to use the PolarCare or ice packs periodically to reduce pain and swelling. You may bathe or shower after the staples are removed at the first office visit following surgery.  MEDICATIONS: You may resume your regular medications. Please take the pain medication as prescribed on the medication. Do not take pain medication on an empty stomach. You have been given a prescription for a blood thinner (Lovenox or Coumadin). Please take the medication as instructed. (NOTE: After completing a 2 week course of Lovenox, take one Enteric-coated aspirin once a day. This along with elevation will help reduce the possibility of phlebitis in your operated leg.) Do not drive or drink alcoholic beverages when taking pain medications.  CALL THE OFFICE FOR: Temperature above 101 degrees Excessive bleeding or drainage on the dressing. Excessive swelling, coldness, or paleness of the toes. Persistent nausea and vomiting.  FOLLOW-UP:  You  should have an appointment to return to the office in 10-14 days after surgery. Arrangements have been made for continuation of Physical Therapy (either home therapy or outpatient therapy).   [facility] Department Directory         [email or web address]       [email or web address]          Cardiology  Appointments: [place] - [phone] [place] - [phone]  Endocrinology  Appointments: [place] - [phone] [place] - [phone]  Gastroenterology  Appointments: [place] - [phone] [place] - [phone]        General Surgery   Appointments: [place] - [phone]  Internal Medicine/Family Medicine  Appointments: [place] - [phone] [place] - [phone] [place] - [phone]  Metabolic and Weigh Loss Surgery  Appointments: [place] - [phone]        Neurology  Appointments: [place] - [phone] [place] - [phone]  Neurosurgery  Appointments: [place] - [phone]  Obstetrics & Gynecology  Appointments: [place] - [phone] [place] - [phone]        Pediatrics  Appointments: [place] - [phone] [place] - [phone]  Physiatry  Appointments: [place] -[phone]  Physical Therapy  Appointments: [place] - [phone] [place] - [phone]        Podiatry  Appointments: [place] - [phone] [place] - [phone]  Pulmonology  Appointments: [place] - [phone]  Rheumatology  Appointments: [place] - [phone]        [place] Location: [facility]  [address] , [place]  [address]  [place] Location: [facility] [address] [place], [place]  [address]  [place] Location: [facility] [address] [place], [place]  [address]    

## 2021-11-25 ENCOUNTER — Ambulatory Visit: Payer: Medicare Other | Admitting: Physician Assistant

## 2021-11-29 ENCOUNTER — Encounter
Admission: RE | Admit: 2021-11-29 | Discharge: 2021-11-29 | Disposition: A | Discharge status: Home / Self Care | Payer: 59 | Source: Ambulatory Visit | Attending: Orthopedic Surgery | Admitting: Orthopedic Surgery

## 2021-11-29 VITALS — BP 156/93 | HR 77 | Temp 98.2°F | Resp 15 | Ht 74.0 in | Wt 257.0 lb

## 2021-11-29 DIAGNOSIS — G4733 Obstructive sleep apnea (adult) (pediatric): Secondary | ICD-10-CM | POA: Diagnosis not present

## 2021-11-29 DIAGNOSIS — J209 Acute bronchitis, unspecified: Secondary | ICD-10-CM | POA: Insufficient documentation

## 2021-11-29 DIAGNOSIS — M1712 Unilateral primary osteoarthritis, left knee: Secondary | ICD-10-CM | POA: Insufficient documentation

## 2021-11-29 DIAGNOSIS — Z01818 Encounter for other preprocedural examination: Secondary | ICD-10-CM | POA: Insufficient documentation

## 2021-11-29 DIAGNOSIS — I10 Essential (primary) hypertension: Secondary | ICD-10-CM | POA: Diagnosis not present

## 2021-11-29 DIAGNOSIS — Z01812 Encounter for preprocedural laboratory examination: Secondary | ICD-10-CM

## 2021-11-29 HISTORY — DX: Obesity, unspecified: E66.9

## 2021-11-29 HISTORY — DX: Gastro-esophageal reflux disease without esophagitis: K21.9

## 2021-11-29 HISTORY — DX: Obesity, class 1: E66.811

## 2021-11-29 HISTORY — DX: Unilateral primary osteoarthritis, left knee: M17.12

## 2021-11-29 HISTORY — DX: Essential (primary) hypertension: I10

## 2021-11-29 HISTORY — DX: Depression, unspecified: F32.A

## 2021-11-29 HISTORY — DX: Obstructive sleep apnea (adult) (pediatric): G47.33

## 2021-11-29 HISTORY — DX: Prediabetes: R73.03

## 2021-11-29 HISTORY — DX: Obstructive sleep apnea (adult) (pediatric): Z99.89

## 2021-11-29 LAB — COMPREHENSIVE METABOLIC PANEL
ALT: 22 U/L (ref 0–44)
AST: 22 U/L (ref 15–41)
Albumin: 4.2 g/dL (ref 3.5–5.0)
Alkaline Phosphatase: 65 U/L (ref 38–126)
Anion gap: 7 (ref 5–15)
BUN: 15 mg/dL (ref 8–23)
CO2: 23 mmol/L (ref 22–32)
Calcium: 9.1 mg/dL (ref 8.9–10.3)
Chloride: 110 mmol/L (ref 98–111)
Creatinine, Ser: 1.13 mg/dL (ref 0.61–1.24)
GFR, Estimated: >60 mL/min (ref >60)
Glucose, Bld: 106 mg/dL — ABNORMAL HIGH (ref 70–99)
Potassium: 4 mmol/L (ref 3.5–5.1)
Sodium: 140 mmol/L (ref 135–145)
Total Bilirubin: 0.9 mg/dL (ref 0.3–1.2)
Total Protein: 7 g/dL (ref 6.5–8.1)

## 2021-11-29 LAB — CBC
HCT: 47.9 % (ref 39.0–52.0)
Hemoglobin: 15.6 g/dL (ref 13.0–17.0)
MCH: 28.2 pg (ref 26.0–34.0)
MCHC: 32.6 g/dL (ref 30.0–36.0)
MCV: 86.6 fL (ref 80.0–100.0)
Platelets: 319 10*3/uL (ref 150–400)
RBC: 5.53 MIL/uL (ref 4.22–5.81)
RDW: 13.4 % (ref 11.5–15.5)
WBC: 7.2 10*3/uL (ref 4.0–10.5)
nRBC: 0 % (ref 0.0–0.2)

## 2021-11-29 LAB — TYPE AND SCREEN
ABO/RH(D): B POS
Antibody Screen: NEGATIVE

## 2021-11-29 LAB — URINALYSIS, ROUTINE W REFLEX MICROSCOPIC
Bilirubin Urine: NEGATIVE
Glucose, UA: NEGATIVE mg/dL
Hgb urine dipstick: NEGATIVE
Ketones, ur: NEGATIVE mg/dL
Leukocytes,Ua: NEGATIVE
Nitrite: NEGATIVE
Protein, ur: NEGATIVE mg/dL
Specific Gravity, Urine: 1.025 (ref 1.005–1.030)
pH: 5 (ref 5.0–8.0)

## 2021-11-29 LAB — SURGICAL PCR SCREEN
MRSA, PCR: NEGATIVE
Staphylococcus aureus: NEGATIVE

## 2021-11-29 LAB — C-REACTIVE PROTEIN: CRP: <0.5 mg/dL (ref <1.0)

## 2021-11-29 LAB — SEDIMENTATION RATE: Sed Rate: 2 mm/hr (ref 0–20)

## 2021-12-07 MED ORDER — LACTATED RINGERS IV SOLN: INTRAVENOUS | Status: DC

## 2021-12-07 MED ORDER — DEXAMETHASONE SODIUM PHOSPHATE 10 MG/ML IJ SOLN: 8.0000 mg | Freq: Once | INTRAMUSCULAR | Status: AC

## 2021-12-07 MED ORDER — CELECOXIB 200 MG PO CAPS: 400.0000 mg | ORAL_CAPSULE | Freq: Once | ORAL | Status: AC

## 2021-12-07 MED ORDER — TRANEXAMIC ACID-NACL 1000-0.7 MG/100ML-% IV SOLN
1000.0000 mg | INTRAVENOUS | Status: AC
  Administered 2021-12-08: 1000 mg via INTRAVENOUS

## 2021-12-07 MED ORDER — CHLORHEXIDINE GLUCONATE 4 % EX LIQD
60.0000 mL | Freq: Once | CUTANEOUS | Status: AC
  Administered 2021-12-08: 4 via TOPICAL

## 2021-12-07 MED ORDER — GABAPENTIN 300 MG PO CAPS: 300.0000 mg | ORAL_CAPSULE | Freq: Once | ORAL | Status: AC

## 2021-12-07 MED ORDER — CHLORHEXIDINE GLUCONATE 0.12 % MT SOLN: 15.0000 mL | Freq: Once | OROMUCOSAL | Status: AC

## 2021-12-07 MED ORDER — ORAL CARE MOUTH RINSE: 15.0000 mL | Freq: Once | OROMUCOSAL | Status: AC

## 2021-12-07 MED ORDER — CEFAZOLIN SODIUM-DEXTROSE 2-4 GM/100ML-% IV SOLN
2.0000 g | INTRAVENOUS | Status: AC
  Administered 2021-12-08: 2 g via INTRAVENOUS

## 2021-12-08 ENCOUNTER — Ambulatory Visit: Payer: 59 | Admitting: Anesthesiology

## 2021-12-08 ENCOUNTER — Observation Stay: Payer: 59

## 2021-12-08 ENCOUNTER — Observation Stay
Admission: RE | Admit: 2021-12-08 | Discharge: 2021-12-09 | Disposition: A | Discharge status: Home Health | Payer: 59 | Attending: Orthopedic Surgery | Admitting: Orthopedic Surgery

## 2021-12-08 ENCOUNTER — Encounter: Payer: Self-pay | Admitting: Orthopedic Surgery

## 2021-12-08 ENCOUNTER — Other Ambulatory Visit: Payer: Self-pay

## 2021-12-08 ENCOUNTER — Encounter
Admission: RE | Disposition: A | Discharge status: Home Health | Payer: Self-pay | Source: Home / Self Care | Attending: Orthopedic Surgery

## 2021-12-08 ENCOUNTER — Ambulatory Visit: Payer: 59 | Admitting: Urgent Care

## 2021-12-08 DIAGNOSIS — E119 Type 2 diabetes mellitus without complications: Secondary | ICD-10-CM

## 2021-12-08 DIAGNOSIS — Z87891 Personal history of nicotine dependence: Secondary | ICD-10-CM | POA: Insufficient documentation

## 2021-12-08 DIAGNOSIS — M1712 Unilateral primary osteoarthritis, left knee: Secondary | ICD-10-CM | POA: Diagnosis present

## 2021-12-08 DIAGNOSIS — I10 Essential (primary) hypertension: Secondary | ICD-10-CM | POA: Insufficient documentation

## 2021-12-08 DIAGNOSIS — Z96651 Presence of right artificial knee joint: Secondary | ICD-10-CM | POA: Insufficient documentation

## 2021-12-08 DIAGNOSIS — R7303 Prediabetes: Secondary | ICD-10-CM | POA: Insufficient documentation

## 2021-12-08 DIAGNOSIS — Z8546 Personal history of malignant neoplasm of prostate: Secondary | ICD-10-CM | POA: Diagnosis not present

## 2021-12-08 DIAGNOSIS — Z85828 Personal history of other malignant neoplasm of skin: Secondary | ICD-10-CM | POA: Diagnosis not present

## 2021-12-08 DIAGNOSIS — Z9989 Dependence on other enabling machines and devices: Secondary | ICD-10-CM

## 2021-12-08 DIAGNOSIS — Z79899 Other long term (current) drug therapy: Secondary | ICD-10-CM | POA: Insufficient documentation

## 2021-12-08 DIAGNOSIS — G4733 Obstructive sleep apnea (adult) (pediatric): Secondary | ICD-10-CM

## 2021-12-08 DIAGNOSIS — Z8673 Personal history of transient ischemic attack (TIA), and cerebral infarction without residual deficits: Secondary | ICD-10-CM | POA: Diagnosis not present

## 2021-12-08 DIAGNOSIS — J209 Acute bronchitis, unspecified: Secondary | ICD-10-CM

## 2021-12-08 DIAGNOSIS — Z01812 Encounter for preprocedural laboratory examination: Secondary | ICD-10-CM

## 2021-12-08 DIAGNOSIS — Z96659 Presence of unspecified artificial knee joint: Secondary | ICD-10-CM

## 2021-12-08 HISTORY — PX: KNEE ARTHROPLASTY: SHX992

## 2021-12-08 LAB — ABO/RH: ABO/RH(D): B POS

## 2021-12-08 LAB — GLUCOSE, CAPILLARY
Glucose-Capillary: 190 mg/dL — ABNORMAL HIGH (ref 70–99)
Glucose-Capillary: 143 mg/dL — ABNORMAL HIGH (ref 70–99)
Glucose-Capillary: 151 mg/dL — ABNORMAL HIGH (ref 70–99)
Glucose-Capillary: 151 mg/dL — ABNORMAL HIGH (ref 70–99)

## 2021-12-08 SURGERY — ARTHROPLASTY, KNEE, TOTAL, USING IMAGELESS COMPUTER-ASSISTED NAVIGATION
Anesthesia: Spinal | Site: Knee | Laterality: Left

## 2021-12-08 MED ORDER — MAGNESIUM HYDROXIDE 400 MG/5ML PO SUSP
30.0000 mL | Freq: Every day | ORAL | Status: DC
  Administered 2021-12-08 – 2021-12-09 (×2): 30 mL via ORAL
  Filled 2021-12-08 (×2): qty 30

## 2021-12-08 MED ORDER — BENAZEPRIL HCL 20 MG PO TABS
40.0000 mg | ORAL_TABLET | Freq: Every day | ORAL | Status: DC
  Administered 2021-12-09: 40 mg via ORAL
  Filled 2021-12-08: qty 2

## 2021-12-08 MED ORDER — MIDAZOLAM HCL 5 MG/5ML IJ SOLN
INTRAMUSCULAR | Status: DC | PRN
  Administered 2021-12-08: 1 mg via INTRAVENOUS
  Administered 2021-12-08: 2 mg via INTRAVENOUS
  Administered 2021-12-08: 1 mg via INTRAVENOUS

## 2021-12-08 MED ORDER — ONDANSETRON HCL 4 MG/2ML IJ SOLN
INTRAMUSCULAR | Status: DC | PRN
  Administered 2021-12-08: 4 mg via INTRAVENOUS

## 2021-12-08 MED ORDER — GLYCOPYRROLATE 0.2 MG/ML IJ SOLN
INTRAMUSCULAR | Status: DC | PRN
  Administered 2021-12-08: .2 mg via INTRAVENOUS

## 2021-12-08 MED ORDER — SODIUM CHLORIDE (PF) 0.9 % IJ SOLN
INTRAMUSCULAR | Status: DC | PRN
  Administered 2021-12-08: 120 mL via INTRAMUSCULAR

## 2021-12-08 MED ORDER — SODIUM CHLORIDE 0.9 % IR SOLN
Status: DC | PRN
  Administered 2021-12-08: 3000 mL

## 2021-12-08 MED ORDER — PHENYLEPHRINE HCL (PRESSORS) 10 MG/ML IV SOLN
INTRAVENOUS | Status: AC
Start: 2021-12-08 — End: ?
  Filled 2021-12-08: qty 1

## 2021-12-08 MED ORDER — SODIUM CHLORIDE FLUSH 0.9 % IV SOLN
INTRAVENOUS | Status: AC
  Filled 2021-12-08: qty 80

## 2021-12-08 MED ORDER — MIDAZOLAM HCL 2 MG/2ML IJ SOLN
INTRAMUSCULAR | Status: AC
  Filled 2021-12-08: qty 2

## 2021-12-08 MED ORDER — SODIUM CHLORIDE 0.9 % IV SOLN: INTRAVENOUS | Status: DC

## 2021-12-08 MED ORDER — FENTANYL CITRATE (PF) 100 MCG/2ML IJ SOLN: 25.0000 ug | INTRAMUSCULAR | Status: DC | PRN

## 2021-12-08 MED ORDER — PHENYLEPHRINE HCL-NACL 20-0.9 MG/250ML-% IV SOLN
INTRAVENOUS | Status: DC | PRN
  Administered 2021-12-08: 20 ug/min via INTRAVENOUS

## 2021-12-08 MED ORDER — FLUOXETINE HCL 20 MG PO CAPS
60.0000 mg | ORAL_CAPSULE | Freq: Every day | ORAL | Status: DC
  Administered 2021-12-09: 60 mg via ORAL
  Filled 2021-12-08: qty 3

## 2021-12-08 MED ORDER — TRAMADOL HCL 50 MG PO TABS
50.0000 mg | ORAL_TABLET | ORAL | Status: DC | PRN
  Administered 2021-12-08: 100 mg via ORAL
  Filled 2021-12-08: qty 2

## 2021-12-08 MED ORDER — ENSURE PRE-SURGERY PO LIQD
296.0000 mL | Freq: Once | ORAL | Status: AC
  Administered 2021-12-08: 296 mL via ORAL
  Filled 2021-12-08: qty 296

## 2021-12-08 MED ORDER — BUPROPION HCL ER (SR) 100 MG PO TB12
100.0000 mg | ORAL_TABLET | Freq: Every day | ORAL | Status: DC
  Administered 2021-12-09: 100 mg via ORAL
  Filled 2021-12-08: qty 1

## 2021-12-08 MED ORDER — CEFAZOLIN SODIUM-DEXTROSE 2-4 GM/100ML-% IV SOLN
INTRAVENOUS | Status: AC
  Filled 2021-12-08: qty 100

## 2021-12-08 MED ORDER — PANTOPRAZOLE SODIUM 40 MG PO TBEC
40.0000 mg | DELAYED_RELEASE_TABLET | Freq: Two times a day (BID) | ORAL | Status: DC
  Administered 2021-12-08 – 2021-12-09 (×3): 40 mg via ORAL
  Filled 2021-12-08 (×3): qty 1

## 2021-12-08 MED ORDER — DEXAMETHASONE SODIUM PHOSPHATE 10 MG/ML IJ SOLN
INTRAMUSCULAR | Status: AC
  Administered 2021-12-08: 8 mg via INTRAVENOUS
  Filled 2021-12-08: qty 1

## 2021-12-08 MED ORDER — OXYCODONE HCL 5 MG PO TABS: 10.0000 mg | ORAL_TABLET | ORAL | Status: DC | PRN

## 2021-12-08 MED ORDER — FENTANYL CITRATE (PF) 100 MCG/2ML IJ SOLN
INTRAMUSCULAR | Status: AC
  Filled 2021-12-08: qty 2

## 2021-12-08 MED ORDER — PROPOFOL 1000 MG/100ML IV EMUL
INTRAVENOUS | Status: AC
Start: 2021-12-08 — End: ?
  Filled 2021-12-08: qty 100

## 2021-12-08 MED ORDER — PHENOL 1.4 % MT LIQD: 1.0000 | OROMUCOSAL | Status: DC | PRN

## 2021-12-08 MED ORDER — ONDANSETRON HCL 4 MG/2ML IJ SOLN: 4.0000 mg | Freq: Four times a day (QID) | INTRAMUSCULAR | Status: DC | PRN

## 2021-12-08 MED ORDER — GABAPENTIN 300 MG PO CAPS
ORAL_CAPSULE | ORAL | Status: AC
  Administered 2021-12-08: 300 mg via ORAL
  Filled 2021-12-08: qty 1

## 2021-12-08 MED ORDER — INSULIN ASPART 100 UNIT/ML IJ SOLN
0.0000 [IU] | Freq: Three times a day (TID) | INTRAMUSCULAR | Status: DC
  Administered 2021-12-08: 3 [IU] via SUBCUTANEOUS
  Filled 2021-12-08 (×2): qty 1

## 2021-12-08 MED ORDER — 0.9 % SODIUM CHLORIDE (POUR BTL) OPTIME
TOPICAL | Status: DC | PRN
  Administered 2021-12-08: 500 mL

## 2021-12-08 MED ORDER — FENTANYL CITRATE (PF) 100 MCG/2ML IJ SOLN
INTRAMUSCULAR | Status: DC | PRN
Start: 2021-12-08 — End: 2021-12-08
  Administered 2021-12-08: 50 ug via INTRAVENOUS

## 2021-12-08 MED ORDER — SENNOSIDES-DOCUSATE SODIUM 8.6-50 MG PO TABS
1.0000 | ORAL_TABLET | Freq: Two times a day (BID) | ORAL | Status: DC
  Administered 2021-12-08 – 2021-12-09 (×3): 1 via ORAL
  Filled 2021-12-08 (×3): qty 1

## 2021-12-08 MED ORDER — ENOXAPARIN SODIUM 30 MG/0.3ML IJ SOSY
30.0000 mg | PREFILLED_SYRINGE | Freq: Two times a day (BID) | INTRAMUSCULAR | Status: DC
  Administered 2021-12-09: 30 mg via SUBCUTANEOUS
  Filled 2021-12-08: qty 0.3

## 2021-12-08 MED ORDER — TRANEXAMIC ACID-NACL 1000-0.7 MG/100ML-% IV SOLN
INTRAVENOUS | Status: AC
  Administered 2021-12-08: 1000 mg via INTRAVENOUS
  Filled 2021-12-08: qty 100

## 2021-12-08 MED ORDER — SURGIPHOR WOUND IRRIGATION SYSTEM - OPTIME
TOPICAL | Status: DC | PRN
  Administered 2021-12-08: 1

## 2021-12-08 MED ORDER — FLEET ENEMA 7-19 GM/118ML RE ENEM: 1.0000 | ENEMA | Freq: Once | RECTAL | Status: DC | PRN

## 2021-12-08 MED ORDER — IPRATROPIUM-ALBUTEROL 0.5-2.5 (3) MG/3ML IN SOLN: 3.0000 mL | Freq: Once | RESPIRATORY_TRACT | Status: AC

## 2021-12-08 MED ORDER — ONDANSETRON HCL 4 MG PO TABS: 4.0000 mg | ORAL_TABLET | Freq: Four times a day (QID) | ORAL | Status: DC | PRN

## 2021-12-08 MED ORDER — BISACODYL 10 MG RE SUPP: 10.0000 mg | Freq: Every day | RECTAL | Status: DC | PRN

## 2021-12-08 MED ORDER — PROMETHAZINE HCL 25 MG/ML IJ SOLN: 6.2500 mg | INTRAMUSCULAR | Status: DC | PRN

## 2021-12-08 MED ORDER — CEFAZOLIN SODIUM-DEXTROSE 2-4 GM/100ML-% IV SOLN
2.0000 g | Freq: Four times a day (QID) | INTRAVENOUS | Status: AC
  Administered 2021-12-08 (×2): 2 g via INTRAVENOUS
  Filled 2021-12-08 (×2): qty 100

## 2021-12-08 MED ORDER — INSULIN ASPART 100 UNIT/ML IJ SOLN: 0.0000 [IU] | Freq: Every day | INTRAMUSCULAR | Status: DC

## 2021-12-08 MED ORDER — ACETAMINOPHEN 10 MG/ML IV SOLN
1000.0000 mg | Freq: Four times a day (QID) | INTRAVENOUS | Status: AC
  Administered 2021-12-08 – 2021-12-09 (×4): 1000 mg via INTRAVENOUS
  Filled 2021-12-08 (×4): qty 100

## 2021-12-08 MED ORDER — CELECOXIB 200 MG PO CAPS
200.0000 mg | ORAL_CAPSULE | Freq: Two times a day (BID) | ORAL | Status: DC
  Administered 2021-12-08 – 2021-12-09 (×3): 200 mg via ORAL
  Filled 2021-12-08 (×3): qty 1

## 2021-12-08 MED ORDER — IPRATROPIUM-ALBUTEROL 0.5-2.5 (3) MG/3ML IN SOLN
RESPIRATORY_TRACT | Status: AC
  Administered 2021-12-08: 3 mL via RESPIRATORY_TRACT
  Filled 2021-12-08: qty 3

## 2021-12-08 MED ORDER — ALUM & MAG HYDROXIDE-SIMETH 200-200-20 MG/5ML PO SUSP: 30.0000 mL | ORAL | Status: DC | PRN

## 2021-12-08 MED ORDER — FERROUS SULFATE 325 (65 FE) MG PO TABS
325.0000 mg | ORAL_TABLET | Freq: Two times a day (BID) | ORAL | Status: DC
  Administered 2021-12-08 – 2021-12-09 (×2): 325 mg via ORAL
  Filled 2021-12-08 (×2): qty 1

## 2021-12-08 MED ORDER — MENTHOL 3 MG MT LOZG: 1.0000 | LOZENGE | OROMUCOSAL | Status: DC | PRN

## 2021-12-08 MED ORDER — ACETAMINOPHEN 10 MG/ML IV SOLN
INTRAVENOUS | Status: DC | PRN
  Administered 2021-12-08: 1000 mg via INTRAVENOUS

## 2021-12-08 MED ORDER — CELECOXIB 200 MG PO CAPS
ORAL_CAPSULE | ORAL | Status: AC
  Administered 2021-12-08: 400 mg via ORAL
  Filled 2021-12-08: qty 2

## 2021-12-08 MED ORDER — BUPIVACAINE HCL (PF) 0.25 % IJ SOLN
INTRAMUSCULAR | Status: AC
  Filled 2021-12-08: qty 120

## 2021-12-08 MED ORDER — CHLORHEXIDINE GLUCONATE 0.12 % MT SOLN
OROMUCOSAL | Status: AC
  Administered 2021-12-08: 15 mL via OROMUCOSAL
  Filled 2021-12-08: qty 15

## 2021-12-08 MED ORDER — DIPHENHYDRAMINE HCL 12.5 MG/5ML PO ELIX: 12.5000 mg | ORAL_SOLUTION | ORAL | Status: DC | PRN

## 2021-12-08 MED ORDER — PROPOFOL 1000 MG/100ML IV EMUL
INTRAVENOUS | Status: AC
  Filled 2021-12-08: qty 100

## 2021-12-08 MED ORDER — BUPIVACAINE LIPOSOME 1.3 % IJ SUSP
INTRAMUSCULAR | Status: AC
  Filled 2021-12-08: qty 40

## 2021-12-08 MED ORDER — DULAGLUTIDE 0.75 MG/0.5ML ~~LOC~~ SOAJ
0.7500 mg | SUBCUTANEOUS | Status: DC
Start: 2021-12-09 — End: 2021-12-09

## 2021-12-08 MED ORDER — ACETAMINOPHEN 10 MG/ML IV SOLN
INTRAVENOUS | Status: AC
  Filled 2021-12-08: qty 100

## 2021-12-08 MED ORDER — PROPOFOL 500 MG/50ML IV EMUL
INTRAVENOUS | Status: DC | PRN
  Administered 2021-12-08: 200 ug/kg/min via INTRAVENOUS

## 2021-12-08 MED ORDER — AMLODIPINE BESYLATE 10 MG PO TABS
10.0000 mg | ORAL_TABLET | Freq: Every day | ORAL | Status: DC
  Administered 2021-12-09: 10 mg via ORAL
  Filled 2021-12-08: qty 1

## 2021-12-08 MED ORDER — ACETAMINOPHEN 325 MG PO TABS: 325.0000 mg | ORAL_TABLET | Freq: Four times a day (QID) | ORAL | Status: DC | PRN

## 2021-12-08 MED ORDER — TRANEXAMIC ACID-NACL 1000-0.7 MG/100ML-% IV SOLN
INTRAVENOUS | Status: AC
  Filled 2021-12-08: qty 100

## 2021-12-08 MED ORDER — OXYCODONE HCL 5 MG PO TABS
5.0000 mg | ORAL_TABLET | ORAL | Status: DC | PRN
  Administered 2021-12-08: 5 mg via ORAL
  Filled 2021-12-08: qty 1

## 2021-12-08 MED ORDER — TRANEXAMIC ACID-NACL 1000-0.7 MG/100ML-% IV SOLN: 1000.0000 mg | Freq: Once | INTRAVENOUS | Status: AC

## 2021-12-08 MED ORDER — BUPIVACAINE HCL (PF) 0.5 % IJ SOLN
INTRAMUSCULAR | Status: DC | PRN
  Administered 2021-12-08: 3 mL

## 2021-12-08 MED ORDER — METOCLOPRAMIDE HCL 5 MG PO TABS
10.0000 mg | ORAL_TABLET | Freq: Three times a day (TID) | ORAL | Status: DC
  Administered 2021-12-08 – 2021-12-09 (×4): 10 mg via ORAL
  Filled 2021-12-08 (×4): qty 2

## 2021-12-08 MED ORDER — HYDROMORPHONE HCL 1 MG/ML IJ SOLN: 0.5000 mg | INTRAMUSCULAR | Status: DC | PRN

## 2021-12-08 SURGICAL SUPPLY — 75 items
ATTUNE MED DOME PAT 41 KNEE (Knees) ×1 IMPLANT
ATTUNE PS FEM LT SZ 7 CEM KNEE (Femur) ×1 IMPLANT
ATTUNE PSRP INSR SZ7 5 KNEE (Insert) ×1 IMPLANT
BASE TIBIAL ROT PLAT SZ 8 KNEE (Knees) IMPLANT
BATTERY INSTRU NAVIGATION (MISCELLANEOUS) ×8 IMPLANT
BLADE CLIPPER SURG (BLADE) ×1 IMPLANT
BLADE SAW 70X12.5 (BLADE) ×2 IMPLANT
BLADE SAW 90X13X1.19 OSCILLAT (BLADE) ×2 IMPLANT
BLADE SAW 90X25X1.19 OSCILLAT (BLADE) ×2 IMPLANT
BONE CEMENT GENTAMICIN (Cement) ×4 IMPLANT
CEMENT BONE GENTAMICIN 40 (Cement) IMPLANT
CEMENT HV SMART SET (Cement) IMPLANT
COOLER POLAR GLACIER W/PUMP (MISCELLANEOUS) ×2 IMPLANT
CUFF TOURN SGL QUICK 24 (TOURNIQUET CUFF)
CUFF TOURN SGL QUICK 34 (TOURNIQUET CUFF)
CUFF TRNQT CYL 24X4X16.5-23 (TOURNIQUET CUFF) IMPLANT
CUFF TRNQT CYL 34X4.125X (TOURNIQUET CUFF) IMPLANT
DRAPE 3/4 80X56 (DRAPES) ×2 IMPLANT
DRAPE INCISE IOBAN 66X45 STRL (DRAPES) IMPLANT
DRSG DERMACEA NONADH 3X8 (GAUZE/BANDAGES/DRESSINGS) ×2 IMPLANT
DRSG MEPILEX SACRM 8.7X9.8 (GAUZE/BANDAGES/DRESSINGS) ×2 IMPLANT
DRSG OPSITE POSTOP 4X14 (GAUZE/BANDAGES/DRESSINGS) ×2 IMPLANT
DRSG TEGADERM 4X4.75 (GAUZE/BANDAGES/DRESSINGS) ×2 IMPLANT
DURAPREP 26ML APPLICATOR (WOUND CARE) ×4 IMPLANT
ELECT CAUTERY BLADE 6.4 (BLADE) ×2 IMPLANT
ELECT REM PT RETURN 9FT ADLT (ELECTROSURGICAL) ×2
ELECTRODE REM PT RTRN 9FT ADLT (ELECTROSURGICAL) ×1 IMPLANT
EX-PIN ORTHOLOCK NAV 4X150 (PIN) ×4 IMPLANT
GLOVE BIOGEL M STRL SZ7.5 (GLOVE) ×8 IMPLANT
GLOVE BIOGEL PI IND STRL 8 (GLOVE) ×1 IMPLANT
GLOVE BIOGEL PI INDICATOR 8 (GLOVE) ×1
GLOVE SURG UNDER POLY LF SZ7.5 (GLOVE) ×2 IMPLANT
GOWN STRL REUS W/ TWL LRG LVL3 (GOWN DISPOSABLE) ×2 IMPLANT
GOWN STRL REUS W/ TWL XL LVL3 (GOWN DISPOSABLE) ×1 IMPLANT
GOWN STRL REUS W/TWL LRG LVL3 (GOWN DISPOSABLE) ×2
GOWN STRL REUS W/TWL XL LVL3 (GOWN DISPOSABLE) ×1
HEMOVAC 400CC 10FR (MISCELLANEOUS) ×2 IMPLANT
HOLDER FOLEY CATH W/STRAP (MISCELLANEOUS) ×2 IMPLANT
HOLSTER ELECTROSUGICAL PENCIL (MISCELLANEOUS) ×1 IMPLANT
HOOD PEEL AWAY FLYTE STAYCOOL (MISCELLANEOUS) ×4 IMPLANT
IV NS IRRIG 3000ML ARTHROMATIC (IV SOLUTION) ×2 IMPLANT
KIT TURNOVER KIT A (KITS) ×2 IMPLANT
KNIFE SCULPS 14X20 (INSTRUMENTS) ×2 IMPLANT
MANIFOLD NEPTUNE II (INSTRUMENTS) ×4 IMPLANT
NDL SPNL 20GX3.5 QUINCKE YW (NEEDLE) ×2 IMPLANT
NEEDLE SPNL 20GX3.5 QUINCKE YW (NEEDLE) ×4 IMPLANT
NS IRRIG 500ML POUR BTL (IV SOLUTION) ×2 IMPLANT
PACK TOTAL KNEE (MISCELLANEOUS) ×2 IMPLANT
PAD ABD DERMACEA PRESS 5X9 (GAUZE/BANDAGES/DRESSINGS) ×4 IMPLANT
PAD WRAPON POLAR KNEE (MISCELLANEOUS) ×1 IMPLANT
PIN DRILL FIX HALF THREAD (BIT) ×4 IMPLANT
PIN DRILL QUICK PACK ×2 IMPLANT
PIN FIXATION 1/8DIA X 3INL (PIN) ×2 IMPLANT
PULSAVAC PLUS IRRIG FAN TIP (DISPOSABLE) ×2
SOL PREP PVP 2OZ (MISCELLANEOUS) ×2
SOLUTION IRRIG SURGIPHOR (IV SOLUTION) ×2 IMPLANT
SOLUTION PREP PVP 2OZ (MISCELLANEOUS) ×1 IMPLANT
SPONGE DRAIN TRACH 4X4 STRL 2S (GAUZE/BANDAGES/DRESSINGS) ×2 IMPLANT
STAPLER SKIN PROX 35W (STAPLE) ×2 IMPLANT
STOCKINETTE IMPERV 14X48 (MISCELLANEOUS) ×1 IMPLANT
STRAP TIBIA SHORT (MISCELLANEOUS) ×2 IMPLANT
SUCTION FRAZIER HANDLE 10FR (MISCELLANEOUS) ×1
SUCTION TUBE FRAZIER 10FR DISP (MISCELLANEOUS) ×1 IMPLANT
SUT VIC AB 0 CT1 36 (SUTURE) ×4 IMPLANT
SUT VIC AB 1 CT1 36 (SUTURE) ×4 IMPLANT
SUT VIC AB 2-0 CT2 27 (SUTURE) ×2 IMPLANT
SYR 30ML LL (SYRINGE) ×4 IMPLANT
TIBIAL BASE ROT PLAT SZ 8 KNEE (Knees) ×2 IMPLANT
TIP FAN IRRIG PULSAVAC PLUS (DISPOSABLE) ×1 IMPLANT
TOWEL OR 17X26 4PK STRL BLUE (TOWEL DISPOSABLE) IMPLANT
TOWER CARTRIDGE SMART MIX (DISPOSABLE) ×2 IMPLANT
TRAY FOLEY MTR SLVR 16FR STAT (SET/KITS/TRAYS/PACK) ×2 IMPLANT
WATER STERILE IRR 1000ML POUR (IV SOLUTION) ×1 IMPLANT
WATER STERILE IRR 500ML POUR (IV SOLUTION) ×1 IMPLANT
WRAPON POLAR PAD KNEE (MISCELLANEOUS) ×2

## 2021-12-08 NOTE — Anesthesia Preprocedure Evaluation (Signed)
Anesthesia Evaluation  Patient identified by MRN, date of birth, ID band Patient awake    Reviewed: Allergy & Precautions, NPO status , Patient's Chart, lab work & pertinent test results  History of Anesthesia Complications Negative for: history of anesthetic complications  Airway Mallampati: II   Neck ROM: Full    Dental  (+) Dental Advidsory Given   Pulmonary sleep apnea , former smoker,    Pulmonary exam normal breath sounds clear to auscultation       Cardiovascular Exercise Tolerance: Good hypertension, Normal cardiovascular exam Rhythm:Regular Rate:Normal     Neuro/Psych PSYCHIATRIC DISORDERS Depression Chronic pain negative neurological ROS     GI/Hepatic Neg liver ROS, GERD  ,  Endo/Other  negative endocrine ROSObesity, prediabetes  Renal/GU negative Renal ROS     Musculoskeletal   Abdominal   Peds  Hematology Prostate and skin CA   Anesthesia Other Findings Past Medical History: No date: Cancer ([place])     Comment:  prostate and skin No date: Degenerative arthritis of left knee No date: Depression No date: Essential hypertension No date: GERD (gastroesophageal reflux disease) No date: Low testosterone No date: Obesity (BMI 30.0-34.9) No date: Obstructive sleep apnea on CPAP No date: Pre-diabetes   Reproductive/Obstetrics                             Anesthesia Physical  Anesthesia Plan  ASA: 2  Anesthesia Plan: Spinal   Post-op Pain Management:    Induction: Intravenous  PONV Risk Score and Plan: 2 and Propofol infusion, TIVA and Treatment may vary due to age or medical condition  Airway Management Planned: Natural Airway and Nasal Cannula  Additional Equipment:   Intra-op Plan:   Post-operative Plan:   Informed Consent: I have reviewed the patients History and Physical, chart, labs and discussed the procedure including the risks, benefits and alternatives  for the proposed anesthesia with the patient or authorized representative who has indicated his/her understanding and acceptance.       Plan Discussed with: CRNA  Anesthesia Plan Comments: (LMA/GETA backup discussed.  Patient consented for risks of anesthesia including but not limited to:  - adverse reactions to medications - damage to eyes, teeth, lips or other oral mucosa - nerve damage due to positioning  - sore throat or hoarseness - damage to heart, brain, nerves, lungs, other parts of body or loss of life  Informed patient about role of CRNA in peri- and intra-operative care.  Patient voiced understanding.)        Anesthesia Quick Evaluation

## 2021-12-08 NOTE — Op Note (Signed)
OPERATIVE NOTE  DATE OF SURGERY:  12/08/2021  PATIENT NAME:  Enrrique Mierzwa   DOB: 05/02/1956  MRN: [id number]  PRE-OPERATIVE DIAGNOSIS: Degenerative arthrosis of the left knee, primary  POST-OPERATIVE DIAGNOSIS:  Same  PROCEDURE:  Left total knee arthroplasty using computer-assisted navigation  SURGEON:  Marciano Sequin. M.D.  ASSISTANT: Cassell Smiles, PA-C (present and scrubbed throughout the case, critical for assistance with exposure, retraction, instrumentation, and closure)  ANESTHESIA: spinal and general  ESTIMATED BLOOD LOSS: 50 mL  FLUIDS REPLACED: 1100 mL of crystalloid  TOURNIQUET TIME: 83 minutes  DRAINS: 2 medium Hemovac drains  SOFT TISSUE RELEASES: Anterior cruciate ligament, posterior cruciate ligament, deep medial collateral ligament, patellofemoral ligament  IMPLANTS UTILIZED: DePuy Attune size 7 posterior stabilized femoral component (cemented), size 8 rotating platform tibial component (cemented), 41 mm medialized dome patella (cemented), and a 5 mm stabilized rotating platform polyethylene insert.  INDICATIONS FOR SURGERY: Hikeem Andersson is a 66 y.o. year old male with a long history of progressive knee pain. X-rays demonstrated severe degenerative changes in tricompartmental fashion. The patient had not seen any significant improvement despite conservative nonsurgical intervention. After discussion of the risks and benefits of surgical intervention, the patient expressed understanding of the risks benefits and agree with plans for total knee arthroplasty.   The risks, benefits, and alternatives were discussed at length including but not limited to the risks of infection, bleeding, nerve injury, stiffness, blood clots, the need for revision surgery, cardiopulmonary complications, among others, and they were willing to proceed.  PROCEDURE IN DETAIL: The patient was brought into the operating room and, after adequate spinal and general anesthesia was achieved, a  tourniquet was placed on the patient's upper thigh. The patient's knee and leg were cleaned and prepped with alcohol and DuraPrep and draped in the usual sterile fashion. A "timeout" was performed as per usual protocol. The lower extremity was exsanguinated using an Esmarch, and the tourniquet was inflated to 300 mmHg. An anterior longitudinal incision was made followed by a standard mid vastus approach. The deep fibers of the medial collateral ligament were elevated in a subperiosteal fashion off of the medial flare of the tibia so as to maintain a continuous soft tissue sleeve. The patella was subluxed laterally and the patellofemoral ligament was incised. Inspection of the knee demonstrated severe degenerative changes with full-thickness loss of articular cartilage. Osteophytes were debrided using a rongeur. Anterior and posterior cruciate ligaments were excised. Two 4.0 mm Schanz pins were inserted in the femur and into the tibia for attachment of the array of trackers used for computer-assisted navigation. Hip center was identified using a circumduction technique. Distal landmarks were mapped using the computer. The distal femur and proximal tibia were mapped using the computer. The distal femoral cutting guide was positioned using computer-assisted navigation so as to achieve a 5 distal valgus cut. The femur was sized and it was felt that a size 7 femoral component was appropriate. A size 7 femoral cutting guide was positioned and the anterior cut was performed and verified using the computer. This was followed by completion of the posterior and chamfer cuts. Femoral cutting guide for the central box was then positioned in the center box cut was performed.  Attention was then directed to the proximal tibia. Medial and lateral menisci were excised. The extramedullary tibial cutting guide was positioned using computer-assisted navigation so as to achieve a 0 varus-valgus alignment and 3 posterior slope. The  cut was performed and verified using the computer. The proximal  tibia was sized and it was felt that a size 8 tibial tray was appropriate. Tibial and femoral trials were inserted followed by insertion of a 5 mm polyethylene insert. This allowed for excellent mediolateral soft tissue balancing both in flexion and in full extension. Finally, the patella was cut and prepared so as to accommodate a 41 mm medialized dome patella. A patella trial was placed and the knee was placed through a range of motion with excellent patellar tracking appreciated. The femoral trial was removed after debridement of posterior osteophytes. The central post-hole for the tibial component was reamed followed by insertion of a keel punch. Tibial trials were then removed. Cut surfaces of bone were irrigated with copious amounts of normal saline using pulsatile lavage and then suctioned dry. Polymethylmethacrylate cement with gentamicin was prepared in the usual fashion using a vacuum mixer. Cement was applied to the cut surface of the proximal tibia as well as along the undersurface of a size 8 rotating platform tibial component. Tibial component was positioned and impacted into place. Excess cement was removed using [employer] fast streamer. Cement was then applied to the cut surfaces of the femur as well as along the posterior flanges of the size 7 femoral component. The femoral component was positioned and impacted into place. Excess cement was removed using [employer] fast streamer. A 5 mm polyethylene trial was inserted and the knee was brought into full extension with steady axial compression applied. Finally, cement was applied to the backside of a 41 mm medialized dome patella and the patellar component was positioned and patellar clamp applied. Excess cement was removed using [employer] fast streamer. After adequate curing of the cement, the tourniquet was deflated after a total tourniquet time of 83 minutes. Hemostasis was achieved using electrocautery. The  knee was irrigated with copious amounts of normal saline using pulsatile lavage followed by 450 ml of Surgiphor and then suctioned dry. 20 mL of 1.3% Exparel and 60 mL of 0.25% Marcaine in 40 mL of normal saline was injected along the posterior capsule, medial and lateral gutters, and along the arthrotomy site. A 5 mm stabilized rotating platform polyethylene insert was inserted and the knee was placed through a range of motion with excellent mediolateral soft tissue balancing appreciated and excellent patellar tracking noted. 2 medium drains were placed in the wound bed and brought out through separate stab incisions. The medial parapatellar portion of the incision was reapproximated using interrupted sutures of #1 Vicryl. Subcutaneous tissue was approximated in layers using first #0 Vicryl followed #2-0 Vicryl. The skin was approximated with skin staples. A sterile dressing was applied.  The patient tolerated the procedure well and was transported to the recovery room in stable condition.    Franciso Dierks P. Holley Bouche., M.D.

## 2021-12-08 NOTE — H&P (Signed)
The patient has been re-examined, and the chart reviewed, and there have been no interval changes to the documented history and physical.    The risks, benefits, and alternatives have been discussed at length. The patient expressed understanding of the risks benefits and agreed with plans for surgical intervention.  Deziray Nabi P. Samiya Mervin, Jr. M.D.    

## 2021-12-08 NOTE — Anesthesia Procedure Notes (Signed)
Date/Time: 12/08/2021 11:05 AM  Performed by: Nelda Marseille, CRNAPre-anesthesia Checklist: Patient identified, Emergency Drugs available, Suction available, Patient being monitored and Timeout performed LMA Size: 4.5

## 2021-12-08 NOTE — Evaluation (Signed)
Physical Therapy Evaluation Patient Details Name: [name] MRN: [id number] DOB: 11-16-1955 Today's Date: 12/08/2021  History of Present Illness  Patient is a 66 year old male here for L TKA. PMH includes HTN, gerd, depression, sleep apnea, CPAP  Clinical Impression  Patient received in bed, he is reporting no pain at this time and is agreeable to PT assessment. Patient required min A for supine to sit and became faint with sitting edge of bed and was assisted back to supine. Attempted mobility 2nd time with improvement in symptoms and patient was able to stand with min guard and RW. After standing patient again felt faint and was assisted back to bed. He will continue to benefit from skilled PT to improve functional independence, strength and ROM for return home.          Recommendations for follow up therapy are one component of a multi-disciplinary discharge planning process, led by the attending physician.  Recommendations may be updated based on patient status, additional functional criteria and insurance authorization.  Follow Up Recommendations Home health PT      Assistance Recommended at Discharge Frequent or constant Supervision/Assistance  Patient can return home with the following  A little help with bathing/dressing/bathroom;A lot of help with walking and/or transfers;Help with stairs or ramp for entrance;Assist for transportation;Assistance with cooking/housework    Equipment Recommendations None recommended by PT  Recommendations for Other Services       Functional Status Assessment Patient has had a recent decline in their functional status and demonstrates the ability to make significant improvements in function in a reasonable and predictable amount of time.     Precautions / Restrictions Precautions Precautions: Fall Restrictions Weight Bearing Restrictions: Yes LLE Weight Bearing: Weight bearing as tolerated      Mobility  Bed Mobility Overal bed  mobility: Needs Assistance Bed Mobility: Supine to Sit, Sit to Supine     Supine to sit: Min guard Sit to supine: Min guard        Transfers Overall transfer level: Needs assistance Equipment used: Rolling walker (2 wheels) Transfers: Sit to/from Stand Sit to Stand: Min guard           General transfer comment: Patient became light headed and dizzy with standing.    Ambulation/Gait               General Gait Details: deferred due to Orthostasis  Stairs            Wheelchair Mobility    Modified Rankin (Stroke Patients Only)       Balance Overall balance assessment: Needs assistance Sitting-balance support: Feet supported Sitting balance-Leahy Scale: Good     Standing balance support: Bilateral upper extremity supported, During functional activity, Reliant on assistive device for balance Standing balance-Leahy Scale: Good                               Pertinent Vitals/Pain Pain Assessment Pain Assessment: No/denies pain    Home Living Family/patient expects to be discharged to:: Private residence Living Arrangements: Spouse/significant other Available Help at Discharge: Family;Available 24 hours/day Type of Home: House Home Access: Stairs to enter   [employer] of Steps: 3            Prior Function Prior Level of Function : Independent/Modified Independent;Driving             Mobility Comments: patient is Freight forwarder of store, drives, fully independent at baseline  ADLs Comments: independent     Hand Dominance        Extremity/Trunk Assessment   Upper Extremity Assessment Upper Extremity Assessment: Defer to OT evaluation    Lower Extremity Assessment Lower Extremity Assessment: LLE deficits/detail LLE Deficits / Details: WNL for TKA LLE Coordination: decreased gross motor    Cervical / Trunk Assessment Cervical / Trunk Assessment: Normal  Communication   Communication: No difficulties   Cognition Arousal/Alertness: Awake/alert Behavior During Therapy: WFL for tasks assessed/performed Overall Cognitive Status: Within Functional Limits for tasks assessed                                          General Comments      Exercises Total Joint Exercises Goniometric ROM: 0-85 grossly   Assessment/Plan    PT Assessment Patient needs continued PT services  PT Problem List Decreased strength;Decreased mobility;Decreased activity tolerance;Decreased balance;Cardiopulmonary status limiting activity       PT Treatment Interventions DME instruction;Therapeutic exercise;Gait training;Balance training;Stair training;Functional mobility training;Therapeutic activities;Patient/family education;Neuromuscular re-education    PT Goals (Current goals can be found in the Care Plan section)  Acute Rehab PT Goals Patient Stated Goal: to return home PT Goal Formulation: With patient Time For Goal Achievement: 12/15/21 Potential to Achieve Goals: Good    Frequency BID     Co-evaluation               AM-PAC PT "6 Clicks" Mobility  Outcome Measure Help needed turning from your back to your side while in a flat bed without using bedrails?: A Little Help needed moving from lying on your back to sitting on the side of a flat bed without using bedrails?: A Little Help needed moving to and from a bed to a chair (including a wheelchair)?: A Little Help needed standing up from a chair using your arms (e.g., wheelchair or bedside chair)?: A Little Help needed to walk in hospital room?: A Lot Help needed climbing 3-5 steps with a railing? : A Lot 6 Click Score: 16    End of Session Equipment Utilized During Treatment: Gait belt;Oxygen Activity Tolerance: Other (comment);Treatment limited secondary to medical complications (Comment) (orthostatic with position changes this pm) Patient left: in bed;with call bell/phone within reach;with bed alarm set;with SCD's  reapplied Nurse Communication: Mobility status PT Visit Diagnosis: Other abnormalities of gait and mobility (R26.89);Muscle weakness (generalized) (M62.81);Difficulty in walking, not elsewhere classified (R26.2)    Time: 5465-0354 PT Time Calculation (min) (ACUTE ONLY): 30 min   Charges:   PT Evaluation $PT Eval Moderate Complexity: 1 Mod PT Treatments $Therapeutic Activity: 8-22 mins        Kentaro Alewine, PT, GCS 12/08/21,3:25 PM

## 2021-12-08 NOTE — Progress Notes (Deleted)
Urine sample sent down.  

## 2021-12-08 NOTE — Transfer of Care (Signed)
Immediate Anesthesia Transfer of Care Note  Patient: [name]  Procedure(s) Performed: COMPUTER ASSISTED TOTAL KNEE ARTHROPLASTY (Left: Knee)  Patient Location: PACU  Anesthesia Type:Spinal  Level of Consciousness: awake, alert  and oriented  Airway & Oxygen Therapy: Patient Spontanous Breathing and Patient connected to face mask oxygen  Post-op Assessment: Report given to RN and Post -op Vital signs reviewed and stable  Post vital signs: Reviewed and stable  Last Vitals:  Vitals Value Taken Time  BP    Temp    Pulse 70 12/08/21 1214  Resp 18 12/08/21 1214  SpO2 89 % 12/08/21 1214  Vitals shown include unvalidated device data.  Last Pain:  Vitals:   12/08/21 0722  TempSrc: Temporal  PainSc: 0-No pain         Complications: No notable events documented.

## 2021-12-08 NOTE — H&P (Signed)
ORTHOPAEDIC HISTORY & PHYSICAL [name], [place] - 11/25/2021 8:45 AM EDT Formatting of this note is different from the original. [place] [facility]ICINE Chief Complaint:   Chief Complaint  Patient presents with  Knee Pain  H & P LEFT KNEE   History of Present Illness:   [name] is a 66 y.o. male that presents to clinic today for his preoperative history and evaluation. Patient presents unaccompanied. The patient is scheduled to undergo a left total knee arthroplasty on 12/08/21 by Dr. Marry Guan. His pain began many years ago. The pain is located primarily along the medial aspect of the knee. He describes his pain as worse with weightbearing. He reports associated swelling. He denies associated numbness or tingling, denies locking or giving way of the knee.   The patient's symptoms have progressed to the point that they decrease his quality of life. The patient has previously undergone conservative treatment including NSAIDS and injections to the knee without adequate control of his symptoms.  Denies history of lumbar surgery, history of DVT, or significant history. Patient will have his wife at home to assist post-operatively.   Past Medical, Surgical, Family, Social History, Allergies, Medications:   Past Medical History:  Past Medical History:  Diagnosis Date  Arthritis  Depression  Hypertension  Prostate cancer (CMS-HCC) diagnosed 2007  Skin cancer, basal cell  Sleep apnea   Past Surgical History:  Past Surgical History:  Procedure Laterality Date  PROSTATE SURGERY 06/06/2005  Radical prostatectomy  RIGHT TOTAL KNEE ARTHROPLASTY 02/06/2013  BASAL CELL EXCISION  TONSILLECTOMY  WISDOM TEETH   Current Medications:  Current Outpatient Medications  Medication Sig Dispense Refill  ACCU-CHEK GUIDE TEST STRIPS test strip as directed  amLODIPine-benazepril (LOTREL) 10-40 mg capsule Take 1 capsule by mouth once daily  amoxicillin  (AMOXIL) 500 MG capsule TAKE 4 CAPSULES BY MOUTH IN A SINGLE DOSE AS DIRECTED 4 capsule 3  buPROPion (WELLBUTRIN SR) 100 MG SR tablet Take 100 mg by mouth once daily  FLUoxetine (PROZAC) 20 MG tablet Take 60 mg by mouth once daily  TRULICITY 9.37 JI/9.6 mL subcutaneous pen injector INJECT 0.75 MG INTO THE SKIN ONCE A WEEK.   No current facility-administered medications for this visit.   Allergies: No Known Allergies  Social History:  Social History   Socioeconomic History  Marital status: Married  Spouse name: Remo Lipps  Number of children: 1  Years of education: 42  Occupational History  Occupation: Animator- Scientist, research (medical)  Tobacco Use  Smoking status: Former  Packs/day: 1.00  Years: 14.00  Pack years: 14.00  Types: Cigarettes  Quit date: 1985  Years since quitting: 38.4  Smokeless tobacco: Never  Vaping Use  Vaping Use: Never used  Substance and Sexual Activity  Alcohol use: Yes  Alcohol/week: 2.0 standard drinks  Types: 2 Cans of beer per week  Drug use: Never  Sexual activity: Defer  Partners: Female   Family History:  Family History  Problem Relation Age of Onset  High blood pressure (Hypertension) Mother  Prostate cancer Father  No Known Problems Sister  Prostate cancer Paternal Grandfather   Review of Systems:   A 10+ ROS was performed, reviewed, and the pertinent orthopaedic findings are documented in the HPI.   Physical Examination:   BP (!) 160/100 (BP Location: Left upper arm, Patient Position: Sitting, BP Cuff Size: Large Adult)  Ht 185.4 cm ('6\' 1"'$ )  Wt (!) 116.6 kg (257 lb)  BMI 33.91 kg/m   Patient is  a well-developed, well-nourished male in no acute distress. Patient has normal mood and affect. Patient is alert and oriented to person, place, and time.   HEENT: Atraumatic, normocephalic. Pupils equal and reactive to light. Extraocular motion intact. Noninjected sclera.  Cardiovascular: Regular rate and rhythm, with no murmurs, rubs, or gallops.  Distal pulses palpable.  Respiratory: Lungs clear to auscultation bilaterally.   Left Knee: Soft tissue swelling: minimal Effusion: none Erythema: none Crepitance: mild Tenderness: medial Alignment: relative varus Mediolateral laxity: medial pseudolaxity Posterior sag: negative Patellar tracking: Good tracking without evidence of subluxation or tilt Atrophy: No significant atrophy.  Quadriceps tone was good. Range of motion: 0/0/130 degrees  Patient able to plantarflex and dorsiflex the left ankle. Able to flex and extend the knee.  Sensation intact over the saphenous, lateral sural cutaneous, superficial fibular, and deep fibular nerve distributions.  Tests Performed/Reviewed:  X-rays  3 views of the left knee were obtained. Images reveal severe loss of medial compartment joint space with osteophyte formation. No fractures or dislocations. No other osseous abnormality noted  I personally ordered and interpreted today's x-rays.  Impression:   ICD-10-CM  1. Primary osteoarthritis of left knee M17.12   Plan:   The patient has end-stage degenerative changes of the left knee. It was explained to the patient that the condition is progressive in nature. Having failed conservative treatment, the patient has elected to proceed with a total joint arthroplasty. The patient will undergo a total joint arthroplasty with Dr. Marry Guan. The risks of surgery, including blood clot and infection, were discussed with the patient. Measures to reduce these risks, including the use of anticoagulation, perioperative antibiotics, and early ambulation were discussed. The importance of postoperative physical therapy was discussed with the patient. The patient elects to proceed with surgery. The patient is instructed to stop all blood thinners prior to surgery. The patient is instructed to call the hospital the day before surgery to learn of the proper arrival time.   Contact our office with any questions or  concerns. Follow up as indicated, or sooner should any new problems arise, if conditions worsen, or if they are otherwise concerned.   [name], [facility] [place], [place] [address] Phone: [phone]  This note was generated in part with voice recognition software and I apologize for any typographical errors that were not detected and corrected.  Electronically signed by [name], PA at 11/25/2021 6:27 PM EDT

## 2021-12-08 NOTE — Anesthesia Procedure Notes (Signed)
Date/Time: 12/08/2021 8:52 AM  Performed by: Nelda Marseille, CRNAPre-anesthesia Checklist: Patient identified, Emergency Drugs available, Suction available, Patient being monitored and Timeout performed Oxygen Delivery Method: Simple face mask

## 2021-12-08 NOTE — Progress Notes (Signed)
Spoke with the patient's wife in the room He lives at home with his wife, she provides transportation He will need a Rolling walker and 3 in 1 [employer] to deliver to the bedside [facility] is set up for [facility] services He can afford his medication

## 2021-12-08 NOTE — Plan of Care (Signed)

## 2021-12-08 NOTE — TOC Progression Note (Signed)
Transition of Care [facility]) - Progression Note    Patient Details  Name: [name] MRN: [id number] Date of Birth: 19-Jun-1955  Transition of Care [facility] CM/SW [place], RN Phone Number: 12/08/2021, 9:08 AM  Clinical Narrative:   Patient is accepted by [facility] for [facility] prior to surgery         Expected Discharge Plan and Services                                                 Social Determinants of Health (SDOH) Interventions    Readmission Risk Interventions     No data to display

## 2021-12-08 NOTE — Anesthesia Procedure Notes (Addendum)
Spinal  Patient location during procedure: OR Start time: 12/08/2021 8:20 AM End time: 12/08/2021 8:48 AM Reason for block: surgical anesthesia Staffing Performed: resident/CRNA and anesthesiologist  Anesthesiologist: Piscitello, Precious Haws, MD Resident/CRNA: Nelda Marseille, CRNA Performed by: Nelda Marseille, CRNA Authorized by: Martha Clan, MD   Preanesthetic Checklist Completed: patient identified, IV checked, site marked, risks and benefits discussed, surgical consent, monitors and equipment checked, pre-op evaluation and timeout performed Spinal Block Patient position: sitting Prep: ChloraPrep Patient monitoring: heart rate, continuous pulse ox, blood pressure and cardiac monitor Approach: midline Location: L4-5 Injection technique: single-shot Needle Needle type: Whitacre and Introducer  Needle gauge: 25 G Needle length: 9 cm Assessment Sensory level: T10 Events: CSF return Additional Notes Sterile aseptic technique used throughout the procedure.  Negative paresthesia. Negative blood return. Positive free-flowing CSF. Expiration date of kit checked and confirmed. Patient tolerated procedure well, without complications.1st attempts made by [facility] CRNA.  MDA Piscitello called for attempt.  Spinal placement successful at L4-l5

## 2021-12-08 NOTE — Progress Notes (Cosign Needed)
Patient is not able to walk the distance required to go the bathroom, or he/she is unable to safely negotiate stairs required to access the bathroom.  A 3in1 BSC will alleviate this problem  

## 2021-12-09 DIAGNOSIS — M1712 Unilateral primary osteoarthritis, left knee: Secondary | ICD-10-CM | POA: Diagnosis not present

## 2021-12-09 LAB — GLUCOSE, CAPILLARY
Glucose-Capillary: 112 mg/dL — ABNORMAL HIGH (ref 70–99)
Glucose-Capillary: 124 mg/dL — ABNORMAL HIGH (ref 70–99)

## 2021-12-09 MED ORDER — OXYCODONE HCL 5 MG PO TABS: 5.0000 mg | ORAL_TABLET | ORAL | 0 refills | Status: DC | PRN

## 2021-12-09 MED ORDER — CELECOXIB 200 MG PO CAPS: 200.0000 mg | ORAL_CAPSULE | Freq: Two times a day (BID) | ORAL | 0 refills | Status: DC

## 2021-12-09 MED ORDER — ENOXAPARIN SODIUM 40 MG/0.4ML IJ SOSY: 40.0000 mg | PREFILLED_SYRINGE | INTRAMUSCULAR | 0 refills | Status: DC

## 2021-12-09 MED ORDER — TRAMADOL HCL 50 MG PO TABS: 50.0000 mg | ORAL_TABLET | ORAL | 0 refills | Status: DC | PRN

## 2021-12-09 NOTE — Progress Notes (Signed)
  Subjective: 1 Day Post-Op Procedure(s) (LRB): COMPUTER ASSISTED TOTAL KNEE ARTHROPLASTY (Left) Patient reports pain as well-controlled.   Patient is well, and has had no acute complaints or problems Plan is to go Home after hospital stay. Negative for chest pain and shortness of breath Fever: no Gastrointestinal: negative for nausea and vomiting.    Objective: Vital signs in last 24 hours: Temp:  [96.9 F (36.1 C)-98.1 F (36.7 C)] 98.1 F (36.7 C) (07/06 0802) Pulse Rate:  [65-76] 68 (07/06 0802) Resp:  [13-26] 18 (07/06 0802) BP: (108-137)/(60-75) 137/66 (07/06 0802) SpO2:  [87 %-94 %] 92 % (07/06 0802)  Intake/Output from previous day:  Intake/Output Summary (Last 24 hours) at 12/09/2021 0928 Last data filed at 12/09/2021 0837 Gross per 24 hour  Intake 1876.66 ml  Output 2650 ml  Net -773.34 ml    Intake/Output this shift: Total I/O In: 240 [P.O.:240] Out: 240 [Urine:240]  Labs: No results for input(s): "HGB" in the last 72 hours. No results for input(s): "WBC", "RBC", "HCT", "PLT" in the last 72 hours. No results for input(s): "NA", "K", "CL", "CO2", "BUN", "CREATININE", "GLUCOSE", "CALCIUM" in the last 72 hours. No results for input(s): "LABPT", "INR" in the last 72 hours.   EXAM General - Patient is Alert, Appropriate, and Oriented Extremity - Neurovascular intact Dorsiflexion/Plantar flexion intact Compartment soft Dressing/Incision -Postoperative dressing remains in place., Polar Care in place and working. , Hemovac in place. , Following removal of post-op dressing, no drainage noted on honeycomb. Motor Function - intact, moving foot and toes well on exam.  Cardiovascular- Regular rate and rhythm, no murmurs/rubs/gallops Respiratory- Lungs clear to auscultation bilaterally    Assessment/Plan: 1 Day Post-Op Procedure(s) (LRB): COMPUTER ASSISTED TOTAL KNEE ARTHROPLASTY (Left) Principal Problem:   Total knee replacement status  Estimated body mass index  is 33 kg/m as calculated from the following:   Height as of 11/29/21: '6\' 2"'$  (1.88 m).   Weight as of 11/29/21: 116.6 kg. Advance diet Up with therapy  Anticipate d/c this PM pending completion of therapy goals.   Post-op dressing removed. , Hemovac removed., and Mini compression dressing applied.   DVT Prophylaxis - Lovenox, Ted hose, and SCDs Weight-Bearing as tolerated to left leg  Cassell Smiles, PA-C [facility] Orthopaedic Surgery 12/09/2021, 9:28 AM

## 2021-12-09 NOTE — Progress Notes (Signed)
Reviewed and gave patient discharge instructions and summary. Removed patient IV. Tolerated well. No other concerns or questions noted at this time.

## 2021-12-09 NOTE — Discharge Summary (Signed)
Physician Discharge Summary  Patient ID: [name] MRN: [id number] DOB/AGE: 11/13/1955 66 y.o.  Admit date: 12/08/2021 Discharge date: 12/09/2021  Admission Diagnoses:  Total knee replacement status [Z96.659]  Surgeries:Procedure(s): Left total knee arthroplasty using computer-assisted navigation   SURGEON:  Marciano Sequin. M.D.   ASSISTANT: Cassell Smiles, PA-C (present and scrubbed throughout the case, critical for assistance with exposure, retraction, instrumentation, and closure)   ANESTHESIA: spinal and general   ESTIMATED BLOOD LOSS: 50 mL   FLUIDS REPLACED: 1100 mL of crystalloid   TOURNIQUET TIME: 83 minutes   DRAINS: 2 medium Hemovac drains   SOFT TISSUE RELEASES: Anterior cruciate ligament, posterior cruciate ligament, deep medial collateral ligament, patellofemoral ligament   IMPLANTS UTILIZED: DePuy Attune size 7 posterior stabilized femoral component (cemented), size 8 rotating platform tibial component (cemented), 41 mm medialized dome patella (cemented), and a 5 mm stabilized rotating platform polyethylene insert.  Discharge Diagnoses: Patient Active Problem List   Diagnosis Date Noted   Total knee replacement status 12/08/2021   Colon cancer screening    OSA on CPAP 08/13/2020   CPAP use counseling 08/13/2020   Obesity (BMI 30.0-34.9) 08/13/2020   Encounter for general adult medical examination with abnormal findings 03/24/2019   Dysuria 03/24/2019   Depression, recurrent ([place]) 10/04/2017   Moderate obesity 10/04/2017   Essential hypertension 06/15/2017    Past Medical History:  Diagnosis Date   Cancer ([place])    prostate and skin   Degenerative arthritis of left knee    Depression    Essential hypertension    GERD (gastroesophageal reflux disease)    Low testosterone    Obesity (BMI 30.0-34.9)    Obstructive sleep apnea on CPAP    Pre-diabetes      Transfusion:    Consultants (if any):   Discharged Condition: Improved  Hospital  Course: [name] is an 66 y.o. male who was admitted 12/08/2021 with a diagnosis of left knee osteoarthritis and went to the operating room on 12/08/2021 and underwent left total knee arthoplasty. The patient received perioperative antibiotics for prophylaxis (see below). The patient tolerated the procedure well and was transported to PACU in stable condition. After meeting PACU criteria, the patient was subsequently transferred to the Orthopaedics/Rehabilitation unit.   The patient received DVT prophylaxis in the form of early mobilization, Lovenox, TED hose, and SCDs . A sacral pad had been placed and heels were elevated off of the bed with rolled towels in order to protect skin integrity. Foley catheter was discontinued on postoperative day #0. Wound drains were discontinued on postoperative day #1. The surgical incision was healing well without signs of infection.  Physical therapy was initiated postoperatively for transfers, gait training, and strengthening. Occupational therapy was initiated for activities of daily living and evaluation for assisted devices. Rehabilitation goals were reviewed in detail with the patient. The patient made steady progress with physical therapy and physical therapy recommended discharge to Home.   The patient achieved the preliminary goals of this hospitalization and was felt to be medically and orthopaedically appropriate for discharge.  He was given perioperative antibiotics:  Anti-infectives (From admission, onward)    Start     Dose/Rate Route Frequency Ordered Stop   12/08/21 1500  ceFAZolin (ANCEF) IVPB 2g/100 mL premix        2 g 200 mL/hr over 30 Minutes Intravenous Every 6 hours 12/08/21 1427 12/08/21 2124   12/08/21 0720  ceFAZolin (ANCEF) 2-4 GM/100ML-% IVPB       Note to Pharmacy:  Trudie Reed S: cabinet override      12/08/21 0720 12/08/21 0855   12/08/21 0600  ceFAZolin (ANCEF) IVPB 2g/100 mL premix        2 g 200 mL/hr over 30 Minutes Intravenous  On call to O.R. 12/07/21 2310 12/08/21 0855     .  Recent vital signs:  Vitals:   12/09/21 0802 12/09/21 1210  BP: 137/66 124/71  Pulse: 68 71  Resp: 18 16  Temp: 98.1 F (36.7 C)   SpO2: 92% 91%    Recent laboratory studies:  No results for input(s): "WBC", "HGB", "HCT", "PLT", "K", "CL", "CO2", "BUN", "CREATININE", "GLUCOSE", "CALCIUM", "LABPT", "INR" in the last 72 hours.  Diagnostic Studies: DG Knee Left Port  Result Date: 12/08/2021 CLINICAL DATA:  Status post left knee arthroplasty. EXAM: PORTABLE LEFT KNEE - 1-2 VIEW COMPARISON:  None Available. FINDINGS: There are postsurgical changes from left total knee arthroplasty. No signs of periprosthetic fracture or dislocation. Alignment appears anatomic. IMPRESSION: Status post left total knee arthroplasty. Electronically Signed   By: Kerby Moors M.D.   On: 12/08/2021 12:21    Discharge Medications:   Allergies as of 12/09/2021   No Known Allergies      Medication List     TAKE these medications    Accu-Chek Guide test strip Generic drug: glucose blood USE AS DIRECTED DAILY   amLODipine-benazepril 10-40 MG capsule Commonly known as: LOTREL Take 1 capsule by mouth daily.   buPROPion ER 100 MG 12 hr tablet Commonly known as: WELLBUTRIN SR TAKE 1 TABLET BY MOUTH EVERY DAY   celecoxib 200 MG capsule Commonly known as: CELEBREX Take 1 capsule (200 mg total) by mouth 2 (two) times daily.   enoxaparin 40 MG/0.4ML injection Commonly known as: LOVENOX Inject 0.4 mLs (40 mg total) into the skin daily for 14 days.   FLUoxetine 20 MG tablet Commonly known as: PROZAC TAKE 3 TABLETS BY MOUTH EVERY DAY   omeprazole 20 MG tablet Commonly known as: PRILOSEC OTC Take 20 mg by mouth daily as needed (heartburn).   oxyCODONE 5 MG immediate release tablet Commonly known as: Oxy IR/ROXICODONE Take 1 tablet (5 mg total) by mouth every 4 (four) hours as needed for severe pain.   traMADol 50 MG tablet Commonly known as:  ULTRAM Take 1 tablet (50 mg total) by mouth every 4 (four) hours as needed for moderate pain.   Trulicity 7.01 XB/9.3JQ Sopn Generic drug: Dulaglutide Inject 0.75 mg into the skin once a week. What changed: additional instructions               Durable Medical Equipment  (From admission, onward)           Start     Ordered   12/08/21 1428  DME Walker rolling  Once       Question:  Patient needs a walker to treat with the following condition  Answer:  Total knee replacement status   12/08/21 1427   12/08/21 1428  DME Bedside commode  Once       Question:  Patient needs a bedside commode to treat with the following condition  Answer:  Total knee replacement status   12/08/21 1427            Disposition: Home with home health PT       [facility], PA-C 12/09/2021, 2:48 PM

## 2021-12-09 NOTE — Evaluation (Signed)
Occupational Therapy Evaluation Patient Details Name: [name] MRN: [id number] DOB: [birth year] Today's Date: 12/09/2021   History of Present Illness Patient is a 66 year old male here for L TKA. PMH includes HTN, gerd, depression, sleep apnea, CPAP   Clinical Impression   Pt seen for OT evaluation this date, POD#1 from above surgery. Pt was independent in all ADL and IADL prior to surgery, working full time. Pt is eager to return to PLOF with less pain and improved safety and independence. Pt currently requires PRN assist for compression stocking mgt while in seated position due to pain and limited AROM of L knee. Pt instructed in polar care mgt, falls prevention strategies, home/routines modifications, DME/AE for LB bathing and dressing tasks, and compression stocking mgt. Handout provided to support recall and carryover. Pt verbalized understanding. No additional skilled OT needs at this time. Will sign off.       Recommendations for follow up therapy are one component of a multi-disciplinary discharge planning process, led by the attending physician.  Recommendations may be updated based on patient status, additional functional criteria and insurance authorization.   Follow Up Recommendations  No OT follow up    Assistance Recommended at Discharge PRN  Patient can return home with the following A little help with bathing/dressing/bathroom;Assistance with cooking/housework;Assist for transportation    Functional Status Assessment  Patient has had a recent decline in their functional status and demonstrates the ability to make significant improvements in function in a reasonable and predictable amount of time.  Equipment Recommendations  BSC/3in1    Recommendations for Other Services       Precautions / Restrictions Precautions Precautions: Fall Restrictions Weight Bearing Restrictions: Yes LLE Weight Bearing: Weight bearing as tolerated      Mobility Bed Mobility                General bed mobility comments: NT, in recliner    Transfers Overall transfer level: Needs assistance Equipment used: Rolling walker (2 wheels), None Transfers: Sit to/from Stand Sit to Stand: Supervision                  Balance Overall balance assessment: Modified Independent                                         ADL either performed or assessed with clinical judgement   ADL Overall ADL's : Modified independent                                       General ADL Comments: PRN assist for compression stockings     Vision         Perception     Praxis      Pertinent Vitals/Pain Pain Assessment Pain Assessment: No/denies pain     Hand Dominance     Extremity/Trunk Assessment Upper Extremity Assessment Upper Extremity Assessment: Overall WFL for tasks assessed   Lower Extremity Assessment Lower Extremity Assessment: LLE deficits/detail LLE Deficits / Details: WNL for TKA   Cervical / Trunk Assessment Cervical / Trunk Assessment: Normal   Communication Communication Communication: No difficulties   Cognition Arousal/Alertness: Awake/alert Behavior During Therapy: WFL for tasks assessed/performed Overall Cognitive Status: Within Functional Limits for tasks assessed  General Comments       Exercises Other Exercises Other Exercises: Pt instructed in home/routine modifications, falls, pet care considerations, polar care mgt, AE/DME, and compression stocking mgt. Handout provided   Shoulder Instructions      Home Living Family/patient expects to be discharged to:: Private residence Living Arrangements: Spouse/significant other Available Help at Discharge: Family;Available 24 hours/day Type of Home: House Home Access: Stairs to enter [employer] of Steps: 2   Home Layout: One level     Bathroom Shower/Tub: Radiographer, therapeutic: Standard     Home Equipment: None;Hand held shower head          Prior Functioning/Environment Prior Level of Function : Independent/Modified Independent;Driving             Mobility Comments: fully independent at baseline ADLs Comments: independent, patient is Chemical engineer, drives        OT Problem List: Decreased strength;Decreased range of motion      OT Treatment/Interventions:      OT Goals(Current goals can be found in the care plan section) Acute Rehab OT Goals Patient Stated Goal: go home OT Goal Formulation: All assessment and education complete, DC therapy  OT Frequency:      Co-evaluation              AM-PAC OT "6 Clicks" Daily Activity     Outcome Measure Help from another person eating meals?: None Help from another person taking care of personal grooming?: None Help from another person toileting, which includes using toliet, bedpan, or urinal?: None Help from another person bathing (including washing, rinsing, drying)?: A Little Help from another person to put on and taking off regular upper body clothing?: None Help from another person to put on and taking off regular lower body clothing?: A Little 6 Click Score: 22   End of Session Equipment Utilized During Treatment: Rolling walker (2 wheels)  Activity Tolerance: Patient tolerated treatment well Patient left: in chair;with call bell/phone within reach;with chair alarm set  OT Visit Diagnosis: Other abnormalities of gait and mobility (R26.89)                Time: 4627-0350 OT Time Calculation (min): 27 min Charges:  OT General Charges $OT Visit: 1 Visit OT Evaluation $OT Eval Low Complexity: 1 Low OT Treatments $Self Care/Home Management : 23-37 mins  Ardeth Perfect., MPH, MS, OTR/L ascom [phone] 12/09/21, 12:48 PM

## 2021-12-09 NOTE — Evaluation (Signed)
Physical Therapy Evaluation Patient Details Name: [name] MRN: [id number] DOB: [birth year] Today's Date: 12/09/2021  History of Present Illness  Patient is a 66 year old male here for L TKA. PMH includes HTN, gerd, depression, sleep apnea, CPAP  Clinical Impression  Patient received this morning with RN present in room getting up out of bed. He is agreeable to PT session. Patient reports he is feeling much better today. Said he did not sleep well, but no pain. He is progressing very well with PT and will continue to benefit from skilled PT to improve strength, functional independence and safety.        Recommendations for follow up therapy are one component of a multi-disciplinary discharge planning process, led by the attending physician.  Recommendations may be updated based on patient status, additional functional criteria and insurance authorization.  Follow Up Recommendations Home health PT      Assistance Recommended at Discharge Intermittent Supervision/Assistance  Patient can return home with the following  A little help with walking and/or transfers;A little help with bathing/dressing/bathroom;Help with stairs or ramp for entrance;Assist for transportation;Assistance with cooking/housework    Equipment Recommendations Rolling walker (2 wheels)  Recommendations for Other Services       Functional Status Assessment Patient has had a recent decline in their functional status and demonstrates the ability to make significant improvements in function in a reasonable and predictable amount of time.     Precautions / Restrictions Precautions Precautions: Fall Restrictions Weight Bearing Restrictions: Yes LLE Weight Bearing: Weight bearing as tolerated      Mobility  Bed Mobility Overal bed mobility: Modified Independent Bed Mobility: Supine to Sit                Transfers Overall transfer level: Needs assistance Equipment used: Rolling walker (2  wheels) Transfers: Sit to/from Stand Sit to Stand: Supervision           General transfer comment: no significant symptoms this am    Ambulation/Gait Ambulation/Gait assistance: Min guard Gait Distance (Feet): 150 Feet Assistive device: Rolling walker (2 wheels) Gait Pattern/deviations: Step-to pattern, Decreased weight shift to left Gait velocity: mildly decreased     General Gait Details: Patient with good weight shifting, cues to stay close to RW, sequencing  Stairs Stairs: Yes Stairs assistance: Supervision Stair Management: Two rails, Step to pattern Number of Stairs: 4    Wheelchair Mobility    Modified Rankin (Stroke Patients Only)       Balance Overall balance assessment: Modified Independent Sitting-balance support: Feet supported Sitting balance-Leahy Scale: Normal     Standing balance support: Bilateral upper extremity supported, During functional activity, Reliant on assistive device for balance Standing balance-Leahy Scale: Good Standing balance comment: Able to stand and get shirt on, pull up shorts                             Pertinent Vitals/Pain Pain Assessment Pain Assessment: No/denies pain    Home Living Family/patient expects to be discharged to:: Private residence Living Arrangements: Spouse/significant other Available Help at Discharge: Family;Available 24 hours/day Type of Home: House Home Access: Level entry       Home Layout: One level Home Equipment: None      Prior Function Prior Level of Function : Independent/Modified Independent;Driving               ADLs Comments: independent     Hand Dominance  Extremity/Trunk Assessment   Upper Extremity Assessment Upper Extremity Assessment: Defer to OT evaluation    Lower Extremity Assessment Lower Extremity Assessment: LLE deficits/detail LLE Deficits / Details: WNL for TKA    Cervical / Trunk Assessment Cervical / Trunk Assessment: Normal   Communication   Communication: No difficulties  Cognition Arousal/Alertness: Awake/alert Behavior During Therapy: WFL for tasks assessed/performed Overall Cognitive Status: Within Functional Limits for tasks assessed                                          General Comments      Exercises Total Joint Exercises Ankle Circles/Pumps: AROM, Both, 10 reps Quad Sets: AROM, Left, 10 reps Short Arc Quad: AROM, Left, 10 reps Heel Slides: AAROM, Left, 10 reps Hip ABduction/ADduction: AROM, Left, 10 reps Straight Leg Raises: AROM, Left, 10 reps Long Arc Quad: AROM, Left, 10 reps Knee Flexion: AROM, Left, 10 reps Goniometric ROM: 0-110   Assessment/Plan    PT Assessment Patient needs continued PT services  PT Problem List Decreased activity tolerance;Decreased mobility;Decreased balance;Decreased range of motion;Decreased strength       PT Treatment Interventions DME instruction;Therapeutic exercise;Gait training;Balance training;Stair training;Functional mobility training;Therapeutic activities;Patient/family education;Neuromuscular re-education    PT Goals (Current goals can be found in the Care Plan section)  Acute Rehab PT Goals Patient Stated Goal: to return home PT Goal Formulation: With patient Time For Goal Achievement: 12/15/21 Potential to Achieve Goals: Good    Frequency BID     Co-evaluation               AM-PAC PT "6 Clicks" Mobility  Outcome Measure Help needed turning from your back to your side while in a flat bed without using bedrails?: None Help needed moving from lying on your back to sitting on the side of a flat bed without using bedrails?: None Help needed moving to and from a bed to a chair (including a wheelchair)?: A Little Help needed standing up from a chair using your arms (e.g., wheelchair or bedside chair)?: A Little Help needed to walk in hospital room?: A Little Help needed climbing 3-5 steps with a railing? : A  Little 6 Click Score: 20    End of Session Equipment Utilized During Treatment: Gait belt Activity Tolerance: Patient tolerated treatment well Patient left: in chair;with call bell/phone within reach;with chair alarm set Nurse Communication: Mobility status PT Visit Diagnosis: Muscle weakness (generalized) (M62.81);Difficulty in walking, not elsewhere classified (R26.2)    Time: 0900-0930 PT Time Calculation (min) (ACUTE ONLY): 30 min   Charges:     PT Treatments $Gait Training: 8-22 mins $Therapeutic Exercise: 8-22 mins        Lucia Mccreadie, PT, GCS 12/09/21,9:49 AM

## 2021-12-09 NOTE — Plan of Care (Signed)

## 2021-12-09 NOTE — Progress Notes (Signed)
Physical Therapy Treatment Patient Details Name: [name] MRN: [id number] DOB: 10/07/1955 Today's Date: 12/09/2021   History of Present Illness Patient is a 66 year old male here for L TKA. PMH includes HTN, gerd, depression, sleep apnea, CPAP    PT Comments    Patient received in recliner, states he is doing well. Feeling a little bit more tender in knee this pm. Otherwise no problems. He is supervision for transfers, ambulation with RW. Patient is progressing well towards goals. Will continue to benefit from skilled PT to improve functional independence, strength and safety.       Recommendations for follow up therapy are one component of a multi-disciplinary discharge planning process, led by the attending physician.  Recommendations may be updated based on patient status, additional functional criteria and insurance authorization.  Follow Up Recommendations  Home health PT     Assistance Recommended at Discharge Intermittent Supervision/Assistance  Patient can return home with the following A little help with walking and/or transfers;A little help with bathing/dressing/bathroom;Help with stairs or ramp for entrance;Assist for transportation;Assistance with cooking/housework   Equipment Recommendations  Rolling walker (2 wheels)    Recommendations for Other Services       Precautions / Restrictions Precautions Precautions: Fall Restrictions Weight Bearing Restrictions: Yes LLE Weight Bearing: Weight bearing as tolerated     Mobility  Bed Mobility Overal bed mobility: Modified Independent Bed Mobility: Supine to Sit           General bed mobility comments: NT, in recliner    Transfers Overall transfer level: Modified independent Equipment used: Rolling walker (2 wheels) Transfers: Sit to/from Stand Sit to Stand: Modified independent (Device/Increase time)           General transfer comment: no significant symptoms this am     Ambulation/Gait Ambulation/Gait assistance: Supervision Gait Distance (Feet): 150 Feet Assistive device: Rolling walker (2 wheels) Gait Pattern/deviations: Step-through pattern, Decreased weight shift to left Gait velocity: mildly decreased     General Gait Details: Patient with good weight shifting, cues to stay close to RW, sequencing   Stairs Stairs: Yes Stairs assistance: Supervision Stair Management: Two rails, Step to pattern Number of Stairs: 4     Wheelchair Mobility    Modified Rankin (Stroke Patients Only)       Balance Overall balance assessment: Modified Independent Sitting-balance support: Feet supported Sitting balance-Leahy Scale: Normal     Standing balance support: Bilateral upper extremity supported, During functional activity, Reliant on assistive device for balance Standing balance-Leahy Scale: Good Standing balance comment: Able to stand and get shirt on, pull up shorts                            Cognition Arousal/Alertness: Awake/alert Behavior During Therapy: WFL for tasks assessed/performed Overall Cognitive Status: Within Functional Limits for tasks assessed                                          Exercises Total Joint Exercises Ankle Circles/Pumps: AROM, Both, 10 reps Quad Sets: AROM, Left, 10 reps Short Arc Quad: AROM, Left, 10 reps Heel Slides: AAROM, Left, 10 reps Hip ABduction/ADduction: AROM, Left, 10 reps Straight Leg Raises: AROM, Left, 10 reps Long Arc Quad: AROM, Left, 10 reps Knee Flexion: AROM, Left, 10 reps Goniometric ROM: 0-110    General Comments  Pertinent Vitals/Pain Pain Assessment Pain Assessment: 0-10 Pain Score: 2  Pain Location: L knee Pain Descriptors / Indicators: Discomfort, Sore Pain Intervention(s): Monitored during session, Repositioned, Premedicated before session    Home Living Family/patient expects to be discharged to:: Private residence Living  Arrangements: Spouse/significant other Available Help at Discharge: Family;Available 24 hours/day Type of Home: House Home Access: Stairs to enter   [employer] of Steps: 2   Home Layout: One level Home Equipment: None;Hand held shower head      Prior Function            PT Goals (current goals can now be found in the care plan section) Acute Rehab PT Goals Patient Stated Goal: to return home PT Goal Formulation: With patient Time For Goal Achievement: 12/15/21 Potential to Achieve Goals: Good Progress towards PT goals: Progressing toward goals    Frequency    BID      PT Plan Current plan remains appropriate    Co-evaluation              AM-PAC PT "6 Clicks" Mobility   Outcome Measure  Help needed turning from your back to your side while in a flat bed without using bedrails?: None Help needed moving from lying on your back to sitting on the side of a flat bed without using bedrails?: None Help needed moving to and from a bed to a chair (including a wheelchair)?: None Help needed standing up from a chair using your arms (e.g., wheelchair or bedside chair)?: None Help needed to walk in hospital room?: A Little Help needed climbing 3-5 steps with a railing? : A Little 6 Click Score: 22    End of Session Equipment Utilized During Treatment: Gait belt Activity Tolerance: Patient tolerated treatment well Patient left: in chair;with call bell/phone within reach Nurse Communication: Mobility status PT Visit Diagnosis: Muscle weakness (generalized) (M62.81);Difficulty in walking, not elsewhere classified (R26.2)     Time: 6734-1937 PT Time Calculation (min) (ACUTE ONLY): 23 min  Charges:  $Gait Training: 8-22 mins $Therapeutic Exercise: 8-22 mins                     Ozil Stettler, PT, GCS 12/09/21,1:54 PM

## 2021-12-09 NOTE — Anesthesia Postprocedure Evaluation (Signed)
Anesthesia Post Note  Patient: [name]  Procedure(s) Performed: COMPUTER ASSISTED TOTAL KNEE ARTHROPLASTY (Left: Knee)  Patient location during evaluation: Nursing Unit Anesthesia Type: Spinal Level of consciousness: awake, awake and alert, oriented and patient cooperative Pain management: pain level controlled Vital Signs Assessment: post-procedure vital signs reviewed and stable Respiratory status: spontaneous breathing, nonlabored ventilation and respiratory function stable Cardiovascular status: stable Postop Assessment: no headache, no backache, patient able to bend at knees, no apparent nausea or vomiting and adequate PO intake Anesthetic complications: no   No notable events documented.   Last Vitals:  Vitals:   12/09/21 0009 12/09/21 0330  BP: 128/65 112/64  Pulse: 76 73  Resp: 20 20  Temp: 36.7 C 36.7 C  SpO2: 91% 92%    Last Pain:  Vitals:   12/08/21 2100  TempSrc:   PainSc: 2                  Gena Laski,  [facility]

## 2022-02-23 ENCOUNTER — Other Ambulatory Visit: Payer: Self-pay | Admitting: Physician Assistant

## 2022-02-23 DIAGNOSIS — I10 Essential (primary) hypertension: Secondary | ICD-10-CM

## 2022-02-24 ENCOUNTER — Other Ambulatory Visit: Payer: Self-pay | Admitting: Physician Assistant

## 2022-02-24 DIAGNOSIS — F339 Major depressive disorder, recurrent, unspecified: Secondary | ICD-10-CM

## 2022-04-25 ENCOUNTER — Encounter: Payer: Self-pay | Admitting: Physician Assistant

## 2022-04-25 ENCOUNTER — Ambulatory Visit (INDEPENDENT_AMBULATORY_CARE_PROVIDER_SITE_OTHER): Payer: Medicare Other | Admitting: Physician Assistant

## 2022-04-25 VITALS — BP 162/99 | HR 86 | Temp 97.6°F | Resp 16 | Ht 74.0 in | Wt 287.4 lb

## 2022-04-25 DIAGNOSIS — E669 Obesity, unspecified: Secondary | ICD-10-CM

## 2022-04-25 DIAGNOSIS — E119 Type 2 diabetes mellitus without complications: Secondary | ICD-10-CM | POA: Diagnosis not present

## 2022-04-25 DIAGNOSIS — I10 Essential (primary) hypertension: Secondary | ICD-10-CM

## 2022-04-25 DIAGNOSIS — E1165 Type 2 diabetes mellitus with hyperglycemia: Secondary | ICD-10-CM

## 2022-04-25 DIAGNOSIS — M7989 Other specified soft tissue disorders: Secondary | ICD-10-CM | POA: Diagnosis not present

## 2022-04-25 DIAGNOSIS — R7309 Other abnormal glucose: Secondary | ICD-10-CM

## 2022-04-25 LAB — POCT GLYCOSYLATED HEMOGLOBIN (HGB A1C): Hemoglobin A1C: 6 % — AB (ref 4.0–5.6)

## 2022-04-25 MED ORDER — TRULICITY 0.75 MG/0.5ML ~~LOC~~ SOAJ: 0.7500 mg | SUBCUTANEOUS | 3 refills | Status: DC

## 2022-04-25 MED ORDER — HYDROCHLOROTHIAZIDE 12.5 MG PO TABS: 12.5000 mg | ORAL_TABLET | Freq: Every day | ORAL | 3 refills | Status: DC

## 2022-04-25 NOTE — Progress Notes (Signed)
[facility] [place], [place] [address]  Internal MEDICINE  Office Visit Note  Patient Name: [name]  [id number]  [id number]  Date of Service: 05/03/2022  Chief Complaint  Patient presents with   [place] 3 weeks ago onto cement, by missing a step. Tailbone is constantly sore. Having swelling in ankles and feet. Weight has increased, so has BP recently, unsure this has to do with swelling as well.     HPI Pt is here for a sick visit. -Left knee replaced in July and doing well. 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 Did not hit head. Since then some swelling in both feet and some low back pain -Had gone off Trulicity, and knows he is not eating well and is less active driving now. -May consider restarting wellbutrin to help weight loss -started stationary bike. Wears compression stockings now. -Due to elevated BP which didn't improve on recheck and some leg swelling with more dependent position of new job will start HCTZ and titrate as needed -BP 150-160/90 at home  Current Medication:  Outpatient Encounter Medications as of 04/25/2022  Medication Sig   ACCU-CHEK GUIDE test strip USE AS DIRECTED DAILY   amLODipine-benazepril (LOTREL) 10-40 MG capsule TAKE 1 CAPSULE BY MOUTH EVERY DAY   hydrochlorothiazide (HYDRODIURIL) 12.5 MG tablet Take 1 tablet (12.5 mg total) by mouth daily.   omeprazole (PRILOSEC OTC) 20 MG tablet Take 20 mg by mouth daily as needed (heartburn).   [DISCONTINUED] Dulaglutide (TRULICITY) 5.88 FO/2.7XA SOPN Inject 0.75 mg into the skin once a week. (Patient taking differently: Inject 0.75 mg into the skin once a week. thursday)   Dulaglutide (TRULICITY) 1.28 NO/6.7EH SOPN Inject 0.75 mg into the skin once a week.    [DISCONTINUED] buPROPion ER (WELLBUTRIN SR) 100 MG 12 hr tablet TAKE 1 TABLET BY MOUTH EVERY DAY   [DISCONTINUED] celecoxib (CELEBREX) 200 MG capsule Take 1 capsule (200 mg total) by mouth 2 (two) times daily.   [DISCONTINUED] enoxaparin (LOVENOX) 40 MG/0.4ML injection Inject 0.4 mLs (40 mg total) into the skin daily for 14 days.   [DISCONTINUED] FLUoxetine (PROZAC) 20 MG tablet TAKE 3 TABLETS BY MOUTH EVERY DAY   [DISCONTINUED] oxyCODONE (OXY IR/ROXICODONE) 5 MG immediate release tablet Take 1 tablet (5 mg total) by mouth every 4 (four) hours as needed for severe pain.   [DISCONTINUED] traMADol (ULTRAM) 50 MG tablet Take 1 tablet (50 mg total) by mouth every 4 (four) hours as needed for moderate pain.   No facility-administered encounter medications on file as of 04/25/2022.      Medical History: Past Medical History:  Diagnosis Date   Cancer [facility])    prostate and skin   Degenerative arthritis of left knee    Depression    Essential hypertension    GERD (gastroesophageal reflux disease)    Low testosterone    Obesity (BMI 30.0-34.9)    Obstructive sleep apnea on CPAP    Pre-diabetes      Vital Signs: BP (!) 162/99   Pulse 86   Temp 97.6 F (36.4 C)   Resp 16   Ht '6\' 2"'$  (1.88 m)   Wt 287 lb 6.4 oz (130.4 kg)   SpO2 98%   BMI 36.90 kg/m    Review  of Systems  Constitutional:  Positive for unexpected weight change. Negative for chills and fatigue.  HENT:  Negative for congestion, rhinorrhea, sneezing and sore throat.   Eyes:  Negative for redness.  Respiratory:  Negative for cough, chest tightness and shortness of breath.   Cardiovascular:  Positive for leg swelling. Negative for chest pain and palpitations.  Gastrointestinal:  Negative for abdominal pain, constipation, diarrhea, nausea and vomiting.  Genitourinary:  Negative for dysuria and frequency.  Musculoskeletal:  Positive for arthralgias and back pain. Negative for joint swelling and neck pain.  Skin:  Negative  for rash.  Neurological: Negative.  Negative for tremors and numbness.  Hematological:  Negative for adenopathy. Does not bruise/bleed easily.  Psychiatric/Behavioral:  Negative for behavioral problems (Depression), sleep disturbance and suicidal ideas. The patient is not nervous/anxious.     Physical Exam Vitals and nursing note reviewed.  Constitutional:      General: He is not in acute distress.    Appearance: He is well-developed. He is obese. He is not diaphoretic.  HENT:     Head: Normocephalic and atraumatic.     Mouth/Throat:     Pharynx: No oropharyngeal exudate.  Eyes:     Pupils: Pupils are equal, round, and reactive to light.  Neck:     Thyroid: No thyromegaly.     Vascular: No JVD.     Trachea: No tracheal deviation.  Cardiovascular:     Rate and Rhythm: Normal rate and regular rhythm.     Heart sounds: Normal heart sounds. No murmur heard.    No friction rub. No gallop.  Pulmonary:     Effort: Pulmonary effort is normal. No respiratory distress.     Breath sounds: No wheezing or rales.  Chest:     Chest wall: No tenderness.  Abdominal:     General: Bowel sounds are normal.     Palpations: Abdomen is soft.  Musculoskeletal:        General: Normal range of motion.     Cervical back: Normal range of motion and neck supple.     Right lower leg: Edema present.     Left lower leg: Edema present.  Lymphadenopathy:     Cervical: No cervical adenopathy.  Skin:    General: Skin is warm and dry.  Neurological:     Mental Status: He is alert and oriented to person, place, and time.     Cranial Nerves: No cranial nerve deficit.  Psychiatric:        Behavior: Behavior normal.        Thought Content: Thought content normal.        Judgment: Judgment normal.       Assessment/Plan: 1. Type 2 diabetes mellitus without complication, without long-term current use of insulin (HCC) - POCT HgB A1C is 6.0, will start back on trulciity for BG control and wt loss and will  work on diet and exercise - Dulaglutide (TRULICITY) 6.26 RS/8.5IO SOPN; Inject 0.75 mg into the skin once a week.  Dispense: 6 mL; Refill: 3  2. Essential hypertension Will start HCTZ  due to elevated BP and ankle swelling and will titrate as needed - hydrochlorothiazide (HYDRODIURIL) 12.5 MG tablet; Take 1 tablet (12.5 mg total) by mouth daily.  Dispense: 90 tablet; Refill: 3  3. Leg swelling Will start HCTZ  due to elevated BP and ankle swelling and will titrate as needed. Will also elevated legs and wear compression stockings - hydrochlorothiazide (HYDRODIURIL) 12.5 MG tablet; Take 1 tablet (  12.5 mg total) by mouth daily.  Dispense: 90 tablet; Refill: 3  4. Obesity (BMI 30-39.9) Will restart trulicity and work on diet and exercise   General Counseling: Mekhai verbalizes understanding of the findings of todays visit and agrees with plan of treatment. I have discussed any further diagnostic evaluation that may be needed or ordered today. We also reviewed his medications today. he has been encouraged to call the office with any questions or concerns that should arise related to todays visit.    Counseling:    Orders Placed This Encounter  Procedures   POCT HgB A1C    Meds ordered this encounter  Medications   hydrochlorothiazide (HYDRODIURIL) 12.5 MG tablet    Sig: Take 1 tablet (12.5 mg total) by mouth daily.    Dispense:  90 tablet    Refill:  3   Dulaglutide (TRULICITY) 2.26 JF/3.5KT SOPN    Sig: Inject 0.75 mg into the skin once a week.    Dispense:  6 mL    Refill:  3    Time spent:30 Minutes

## 2022-05-19 ENCOUNTER — Encounter: Payer: Medicare Other | Admitting: Physician Assistant

## 2022-06-07 DIAGNOSIS — K080 Exfoliation of teeth due to systemic causes: Secondary | ICD-10-CM | POA: Diagnosis not present

## 2022-06-10 ENCOUNTER — Encounter: Payer: Self-pay | Admitting: Physician Assistant

## 2022-06-10 ENCOUNTER — Ambulatory Visit (INDEPENDENT_AMBULATORY_CARE_PROVIDER_SITE_OTHER): Payer: Medicare Other | Admitting: Physician Assistant

## 2022-06-10 VITALS — BP 170/93 | HR 84 | Temp 98.5°F | Resp 16 | Ht 74.0 in | Wt 289.0 lb

## 2022-06-10 DIAGNOSIS — R300 Dysuria: Secondary | ICD-10-CM

## 2022-06-10 DIAGNOSIS — I10 Essential (primary) hypertension: Secondary | ICD-10-CM | POA: Diagnosis not present

## 2022-06-10 DIAGNOSIS — M5441 Lumbago with sciatica, right side: Secondary | ICD-10-CM

## 2022-06-10 DIAGNOSIS — M5442 Lumbago with sciatica, left side: Secondary | ICD-10-CM | POA: Diagnosis not present

## 2022-06-10 DIAGNOSIS — Z0001 Encounter for general adult medical examination with abnormal findings: Secondary | ICD-10-CM

## 2022-06-10 DIAGNOSIS — E119 Type 2 diabetes mellitus without complications: Secondary | ICD-10-CM | POA: Diagnosis not present

## 2022-06-10 MED ORDER — BUPROPION HCL ER (XL) 150 MG PO TB24: 150.0000 mg | ORAL_TABLET | Freq: Every day | ORAL | 1 refills | Status: DC

## 2022-06-10 MED ORDER — HYDROCHLOROTHIAZIDE 25 MG PO TABS: 25.0000 mg | ORAL_TABLET | Freq: Every day | ORAL | 3 refills | Status: DC

## 2022-06-10 NOTE — Progress Notes (Signed)
[facility] [place], [place] [address]  Internal MEDICINE  Office Visit Note  Patient Name: [name]  [id number]  [id number]  Date of Service: 06/15/2022  Chief Complaint  Patient presents with   Annual Exam   Depression   Gastroesophageal Reflux   Hypertension   Sciatica    Pain level is 12/10 per patient.     HPI Pt is here for routine health maintenance examination -Bad sciatica bilaterally, low back pain and burning every morning recently. Has to get out of bed slowly. Trying some stretching exercises. -If he sits too long or getting out of bed is when it really bother him. It is not bothering him much in office now or once he is up doing things during the day. He did have a fall several months ago and will go ahead and order lumbar xray for further evaluation. -Swelling in legs/feet greatly improved, once resolved he stopped taking hctz consistently. BP at home  was 300P systolic, but now has been 233A systolic. Will double hctz to '25mg'$  daily and advised to continue taking this daily even if swelling better to help with BP and monitor closely. -BG 117 this morning -had first shingles shot and will have second in 1 month, has not had PNA vaccine. -He does mention his father recently passed away and he is leaving to go out of town after visit today for funeral. He reports it is a stressful time for family, but that his Dad had a great long life and was able to spend time with him before his passing which has helped -unable to get Trulicity due to backorder at his pharmacy and may try calling a different pharmacy  Current Medication: Outpatient Encounter Medications as of 06/10/2022  Medication Sig   ACCU-CHEK GUIDE test strip USE AS DIRECTED DAILY   amLODipine-benazepril (LOTREL) 10-40 MG capsule TAKE 1 CAPSULE BY MOUTH EVERY DAY   buPROPion (WELLBUTRIN XL) 150 MG 24 hr tablet Take 1 tablet (150 mg total) by mouth daily.   Dulaglutide (TRULICITY)  0.76 AU/6.3FH SOPN Inject 0.75 mg into the skin once a week.   hydrochlorothiazide (HYDRODIURIL) 25 MG tablet Take 1 tablet (25 mg total) by mouth daily.   omeprazole (PRILOSEC OTC) 20 MG tablet Take 20 mg by mouth daily as needed (heartburn).   [DISCONTINUED] hydrochlorothiazide (HYDRODIURIL) 12.5 MG tablet Take 1 tablet (12.5 mg total) by mouth daily.   No facility-administered encounter medications on file as of 06/10/2022.    Surgical History: Past Surgical History:  Procedure Laterality Date   COLONOSCOPY WITH PROPOFOL N/A 07/01/2021   Procedure: COLONOSCOPY WITH PROPOFOL;  Surgeon: Lucilla Lame, MD;  Location: [facility] ENDOSCOPY;  Service: Endoscopy;  Laterality: N/A;   KNEE ARTHROPLASTY Left 12/08/2021   Procedure: COMPUTER ASSISTED TOTAL KNEE ARTHROPLASTY;  Surgeon: Dereck Leep, MD;  Location: [facility] ORS;  Service: Orthopedics;  Laterality: Left;   PROSTATECTOMY  2007   REPLACEMENT TOTAL KNEE Right 02/06/2013   TONSILLECTOMY Bilateral     Medical History: Past Medical History:  Diagnosis Date   Cancer ([place])    prostate and skin   Degenerative arthritis of left knee    Depression    Essential hypertension    GERD (gastroesophageal reflux disease)    Low testosterone    Obesity (BMI 30.0-34.9)    Obstructive sleep apnea on CPAP    Pre-diabetes     Family History: Family History  Problem Relation Age of Onset   ALS Mother  Cancer Father    Cancer Paternal Grandfather       Review of Systems  Constitutional:  Negative for chills, fatigue and unexpected weight change.  HENT:  Negative for congestion, rhinorrhea, sneezing and sore throat.   Eyes:  Negative for redness.  Respiratory:  Negative for cough, chest tightness and shortness of breath.   Cardiovascular:  Negative for chest pain, palpitations and leg swelling.  Gastrointestinal:  Negative for abdominal pain, constipation, diarrhea, nausea and vomiting.  Genitourinary:  Negative for dysuria and frequency.   Musculoskeletal:  Positive for arthralgias and back pain. Negative for joint swelling and neck pain.  Skin:  Negative for rash.  Neurological: Negative.  Negative for tremors.  Hematological:  Negative for adenopathy. Does not bruise/bleed easily.  Psychiatric/Behavioral:  Negative for behavioral problems (Depression), sleep disturbance and suicidal ideas. The patient is not nervous/anxious.      Vital Signs: BP (!) 170/93   Pulse 84   Temp 98.5 F (36.9 C)   Resp 16   Ht '6\' 2"'$  (1.88 m)   Wt 289 lb (131.1 kg)   SpO2 97%   BMI 37.11 kg/m    Physical Exam Vitals and nursing note reviewed.  Constitutional:      General: He is not in acute distress.    Appearance: He is well-developed. He is obese. He is not diaphoretic.  HENT:     Head: Normocephalic and atraumatic.     Mouth/Throat:     Pharynx: No oropharyngeal exudate.  Eyes:     Pupils: Pupils are equal, round, and reactive to light.  Neck:     Thyroid: No thyromegaly.     Vascular: No JVD.     Trachea: No tracheal deviation.  Cardiovascular:     Rate and Rhythm: Normal rate and regular rhythm.     Pulses:          Dorsalis pedis pulses are 2+ on the right side and 2+ on the left side.       Posterior tibial pulses are 2+ on the right side and 2+ on the left side.     Heart sounds: Normal heart sounds. No murmur heard.    No friction rub. No gallop.  Pulmonary:     Effort: Pulmonary effort is normal. No respiratory distress.     Breath sounds: No wheezing or rales.  Chest:     Chest wall: No tenderness.  Abdominal:     General: Bowel sounds are normal.     Palpations: Abdomen is soft.     Tenderness: There is no abdominal tenderness.  Musculoskeletal:        General: No tenderness. Normal range of motion.     Cervical back: Normal range of motion and neck supple.     Right lower leg: No edema.     Left lower leg: No edema.  Feet:     Right foot:     Protective Sensation: 2 sites tested.  2 sites sensed.      Skin integrity: Dry skin present.     Left foot:     Protective Sensation: 2 sites tested.  2 sites sensed.     Skin integrity: Dry skin present.  Lymphadenopathy:     Cervical: No cervical adenopathy.  Skin:    General: Skin is warm and dry.  Neurological:     Mental Status: He is alert and oriented to person, place, and time.     Cranial Nerves: No cranial nerve deficit.  Psychiatric:  Behavior: Behavior normal.        Thought Content: Thought content normal.        Judgment: Judgment normal.      LABS: Recent Results (from the past 2160 hour(s))  POCT HgB A1C     Status: Abnormal   Collection Time: 04/25/22 10:44 AM  Result Value Ref Range   Hemoglobin A1C 6.0 (A) 4.0 - 5.6 %   HbA1c POC (<> result, manual entry)     HbA1c, POC (prediabetic range)     HbA1c, POC (controlled diabetic range)          Assessment/Plan: 1. Encounter for general adult medical examination with abnormal findings CPE performed, due for second shingles and PNA vaccines  2. Type 2 diabetes mellitus without complication, without long-term current use of insulin ([place]) Unfortunately has not started Trulicity yet due to shortage at pharmacy and may try alternative pharmacy. Continue to work on diet and exercise - Urine Microalbumin w/creat. ratio  3. Essential hypertension Very elevated in office, but denies symptoms. Will increase to '25mg'$  HCTZ and continue Lotrel as before and monitor BP cloesly - hydrochlorothiazide (HYDRODIURIL) 25 MG tablet; Take 1 tablet (25 mg total) by mouth daily.  Dispense: 90 tablet; Refill: 3  4. Acute bilateral low back pain with bilateral sciatica Will check lumbar xray and refer to ortho if needed. Could consider gabapentin if needed. - DG Lumbar Spine Complete; Future  5. Dysuria - UA/M w/rflx Culture, Routine   General Counseling: Aneesh verbalizes understanding of the findings of todays visit and agrees with plan of treatment. I have discussed  any further diagnostic evaluation that may be needed or ordered today. We also reviewed his medications today. he has been encouraged to call the office with any questions or concerns that should arise related to todays visit.    Counseling:    Orders Placed This Encounter  Procedures   DG Lumbar Spine Complete   Urine Microalbumin w/creat. ratio   UA/M w/rflx Culture, Routine    Meds ordered this encounter  Medications   hydrochlorothiazide (HYDRODIURIL) 25 MG tablet    Sig: Take 1 tablet (25 mg total) by mouth daily.    Dispense:  90 tablet    Refill:  3   buPROPion (WELLBUTRIN XL) 150 MG 24 hr tablet    Sig: Take 1 tablet (150 mg total) by mouth daily.    Dispense:  90 tablet    Refill:  1    This patient was seen by Drema Dallas, PA-C in collaboration with Dr. Clayborn Bigness as a part of collaborative care agreement.  Total time spent:35 Minutes  Time spent includes review of chart, medications, test results, and follow up plan with the patient.     Lavera Guise, MD  Internal Medicine

## 2022-06-13 ENCOUNTER — Ambulatory Visit
Admission: RE | Admit: 2022-06-13 | Discharge: 2022-06-13 | Disposition: A | Discharge status: Home / Self Care | Payer: Medicare Other | Source: Ambulatory Visit | Attending: Physician Assistant | Admitting: Physician Assistant

## 2022-06-13 ENCOUNTER — Ambulatory Visit
Admission: RE | Admit: 2022-06-13 | Discharge: 2022-06-13 | Disposition: A | Discharge status: Home / Self Care | Payer: Medicare Other | Attending: Physician Assistant | Admitting: Physician Assistant

## 2022-06-13 DIAGNOSIS — M4316 Spondylolisthesis, lumbar region: Secondary | ICD-10-CM | POA: Diagnosis not present

## 2022-06-13 DIAGNOSIS — M5441 Lumbago with sciatica, right side: Secondary | ICD-10-CM

## 2022-06-13 DIAGNOSIS — M5442 Lumbago with sciatica, left side: Secondary | ICD-10-CM | POA: Diagnosis not present

## 2022-06-13 DIAGNOSIS — M545 Low back pain, unspecified: Secondary | ICD-10-CM | POA: Diagnosis not present

## 2022-06-14 ENCOUNTER — Telehealth: Payer: Self-pay

## 2022-06-14 NOTE — Telephone Encounter (Signed)
Spoke with patient regarding spinal imaging and discussed results.

## 2022-06-14 NOTE — Telephone Encounter (Signed)
-----   Message from Mylinda Latina, PA-C sent at 06/13/2022  4:29 PM EST ----- Please let him know that there were no acute findings on his lumbar xray, but did show some mild degenerative and alignments changes that could be contributing to pain. Might benefit from seeing ortho if pain continues

## 2022-07-08 ENCOUNTER — Telehealth: Payer: Self-pay

## 2022-07-08 ENCOUNTER — Ambulatory Visit (INDEPENDENT_AMBULATORY_CARE_PROVIDER_SITE_OTHER): Payer: Medicare Other | Admitting: Physician Assistant

## 2022-07-08 ENCOUNTER — Encounter: Payer: Self-pay | Admitting: Physician Assistant

## 2022-07-08 ENCOUNTER — Telehealth: Payer: Self-pay | Admitting: Physician Assistant

## 2022-07-08 VITALS — BP 160/88 | HR 86 | Temp 98.3°F | Resp 16 | Ht 74.0 in | Wt 280.4 lb

## 2022-07-08 DIAGNOSIS — E119 Type 2 diabetes mellitus without complications: Secondary | ICD-10-CM | POA: Diagnosis not present

## 2022-07-08 DIAGNOSIS — M5441 Lumbago with sciatica, right side: Secondary | ICD-10-CM | POA: Diagnosis not present

## 2022-07-08 DIAGNOSIS — I10 Essential (primary) hypertension: Secondary | ICD-10-CM

## 2022-07-08 DIAGNOSIS — M5442 Lumbago with sciatica, left side: Secondary | ICD-10-CM

## 2022-07-08 MED ORDER — SEMAGLUTIDE(0.25 OR 0.5MG/DOS) 2 MG/3ML ~~LOC~~ SOPN: 0.2500 mg | PEN_INJECTOR | SUBCUTANEOUS | 0 refills | Status: DC

## 2022-07-08 MED ORDER — GABAPENTIN 100 MG PO CAPS: 100.0000 mg | ORAL_CAPSULE | Freq: Three times a day (TID) | ORAL | 3 refills | Status: DC

## 2022-07-08 NOTE — Telephone Encounter (Signed)
Awaiting 07/08/22 office notes for Orthopedic referral-Toni

## 2022-07-08 NOTE — Telephone Encounter (Signed)
PA was done for Ozempic.  

## 2022-07-08 NOTE — Progress Notes (Signed)
[facility] [place], [place] [address]  Internal MEDICINE  Office Visit Note  Patient Name: [name]  [id number]  [id number]  Date of Service: 07/08/2022  Chief Complaint  Patient presents with   Follow-up   Depression   Gastroesophageal Reflux   Hypertension   Quality Metric Gaps    Pneumonia vaccine    HPI Pt is here for routine follow up -BP worse on recheck, was controlled at home. May be due to pain. -at home BP 063K systolic typically. Will bring home cuff next visit to compare -Low back continues to hurt him especially first thing in AM. Taking 3 Advil in AM. Burning down into legs. Xrays were down and did show degenerative changes with narrow joint space and mild retrolisthesis. Discussed trialing gabapentin to help pain, but will also refer to ortho for further evaluation and treatment -Unable to get trulicity still, would like to try alternative. Will send ozempic  Current Medication: Outpatient Encounter Medications as of 07/08/2022  Medication Sig   ACCU-CHEK GUIDE test strip USE AS DIRECTED DAILY   amLODipine-benazepril (LOTREL) 10-40 MG capsule TAKE 1 CAPSULE BY MOUTH EVERY DAY   buPROPion (WELLBUTRIN XL) 150 MG 24 hr tablet Take 1 tablet (150 mg total) by mouth daily.   gabapentin (NEURONTIN) 100 MG capsule Take 1 capsule (100 mg total) by mouth 3 (three) times daily.   hydrochlorothiazide (HYDRODIURIL) 25 MG tablet Take 1 tablet (25 mg total) by mouth daily.   omeprazole (PRILOSEC OTC) 20 MG tablet Take 20 mg by mouth daily as needed (heartburn).   Semaglutide,0.25 or 0.'5MG'$ /DOS, 2 MG/3ML SOPN Inject 0.25 mg into the skin once a week.   [DISCONTINUED] Dulaglutide (TRULICITY) 1.60 FU/9.3AT SOPN Inject 0.75 mg into the skin once a week.   No facility-administered encounter medications on file as of 07/08/2022.    Surgical History: Past Surgical History:  Procedure Laterality Date   COLONOSCOPY WITH PROPOFOL N/A 07/01/2021    Procedure: COLONOSCOPY WITH PROPOFOL;  Surgeon: Lucilla Lame, MD;  Location: [facility];  Service: Endoscopy;  Laterality: N/A;   KNEE ARTHROPLASTY Left 12/08/2021   Procedure: COMPUTER ASSISTED TOTAL KNEE ARTHROPLASTY;  Surgeon: Dereck Leep, MD;  Location: [facility] ORS;  Service: Orthopedics;  Laterality: Left;   PROSTATECTOMY  2007   REPLACEMENT TOTAL KNEE Right 02/06/2013   TONSILLECTOMY Bilateral     Medical History: Past Medical History:  Diagnosis Date   Cancer ([place])    prostate and skin   Degenerative arthritis of left knee    Depression    Essential hypertension    GERD (gastroesophageal reflux disease)    Low testosterone    Obesity (BMI 30.0-34.9)    Obstructive sleep apnea on CPAP    Pre-diabetes     Family History: Family History  Problem Relation Age of Onset   ALS Mother    Cancer Father    Cancer Paternal Grandfather     Social History   Socioeconomic History   Marital status: Married    Spouse name: Remo Lipps   Number of children: 1   Years of education: Not on file   Highest education level: Not on file  Occupational History   Not on file  Tobacco Use   Smoking status: Former    Types: Cigarettes   Smokeless tobacco: Former    Types: Nurse, children's Use: Never used  Substance and Sexual Activity   Alcohol use: Yes    Comment: social  Drug use: No   Sexual activity: Not on file  Other Topics Concern   Not on file  Social History Narrative   Not on file   Social Determinants of Health   Financial Resource Strain: Not on file  Food Insecurity: Not on file  Transportation Needs: Not on file  Physical Activity: Not on file  Stress: Not on file  Social Connections: Not on file  Intimate Partner Violence: Not on file      Review of Systems  Constitutional:  Negative for chills, fatigue and unexpected weight change.  HENT:  Negative for congestion, rhinorrhea, sneezing and sore throat.   Eyes:  Negative for redness.   Respiratory:  Negative for cough, chest tightness and shortness of breath.   Cardiovascular:  Negative for chest pain, palpitations and leg swelling.  Gastrointestinal:  Negative for abdominal pain, constipation, diarrhea, nausea and vomiting.  Genitourinary:  Negative for dysuria and frequency.  Musculoskeletal:  Positive for arthralgias and back pain. Negative for joint swelling and neck pain.  Skin:  Negative for rash.  Neurological: Negative.  Negative for tremors.  Hematological:  Negative for adenopathy. Does not bruise/bleed easily.  Psychiatric/Behavioral:  Negative for behavioral problems (Depression), sleep disturbance and suicidal ideas. The patient is not nervous/anxious.     Vital Signs: BP (!) 160/88   Pulse 86   Temp 98.3 F (36.8 C)   Resp 16   Ht '6\' 2"'$  (1.88 m)   Wt 280 lb 6.4 oz (127.2 kg)   SpO2 96%   BMI 36.00 kg/m    Physical Exam Vitals and nursing note reviewed.  Constitutional:      General: He is not in acute distress.    Appearance: Normal appearance. He is well-developed. He is obese. He is not diaphoretic.  HENT:     Head: Normocephalic and atraumatic.     Mouth/Throat:     Pharynx: No oropharyngeal exudate.  Eyes:     Pupils: Pupils are equal, round, and reactive to light.  Neck:     Thyroid: No thyromegaly.     Vascular: No JVD.     Trachea: No tracheal deviation.  Cardiovascular:     Rate and Rhythm: Normal rate and regular rhythm.     Heart sounds: Normal heart sounds. No murmur heard.    No friction rub. No gallop.  Pulmonary:     Effort: Pulmonary effort is normal. No respiratory distress.     Breath sounds: No wheezing or rales.  Chest:     Chest wall: No tenderness.  Abdominal:     General: Bowel sounds are normal.     Palpations: Abdomen is soft.  Musculoskeletal:        General: Normal range of motion.     Cervical back: Normal range of motion and neck supple.     Comments: Low back pain upon standing from seated position  with radiation into both legs  Lymphadenopathy:     Cervical: No cervical adenopathy.  Skin:    General: Skin is warm and dry.  Neurological:     Mental Status: He is alert and oriented to person, place, and time.     Cranial Nerves: No cranial nerve deficit.  Psychiatric:        Behavior: Behavior normal.        Thought Content: Thought content normal.        Judgment: Judgment normal.        Assessment/Plan: 1. Essential hypertension Elevated in office, however has  been better at home and swelling improved since HCTZ. Will bring home cuff to next visit to compare in office.  2. Acute bilateral low back pain with bilateral sciatica May try gabapentin, advised to start with nighttime dose as it can make him drowsy. Will go ahead and refer to ortho as well - gabapentin (NEURONTIN) 100 MG capsule; Take 1 capsule (100 mg total) by mouth 3 (three) times daily.  Dispense: 90 capsule; Refill: 3 - AMB referral to orthopedics  3. Type 2 diabetes mellitus without complication, without long-term current use of insulin (HCC) Will switch to ozempic, continue to work on diet and exercise as able - Semaglutide,0.25 or 0.'5MG'$ /DOS, 2 MG/3ML SOPN; Inject 0.25 mg into the skin once a week.  Dispense: 3 mL; Refill: 0   General Counseling: Kimmie verbalizes understanding of the findings of todays visit and agrees with plan of treatment. I have discussed any further diagnostic evaluation that may be needed or ordered today. We also reviewed his medications today. he has been encouraged to call the office with any questions or concerns that should arise related to todays visit.    Orders Placed This Encounter  Procedures   AMB referral to orthopedics    Meds ordered this encounter  Medications   gabapentin (NEURONTIN) 100 MG capsule    Sig: Take 1 capsule (100 mg total) by mouth 3 (three) times daily.    Dispense:  90 capsule    Refill:  3   Semaglutide,0.25 or 0.'5MG'$ /DOS, 2 MG/3ML SOPN     Sig: Inject 0.25 mg into the skin once a week.    Dispense:  3 mL    Refill:  0    This patient was seen by Drema Dallas, PA-C in collaboration with Dr. Clayborn Bigness as a part of collaborative care agreement.   Total time spent:30 Minutes Time spent includes review of chart, medications, test results, and follow up plan with the patient.      Dr Lavera Guise Internal medicine

## 2022-07-10 ENCOUNTER — Encounter: Payer: Self-pay | Admitting: Physician Assistant

## 2022-07-12 ENCOUNTER — Encounter: Payer: Self-pay | Admitting: Physician Assistant

## 2022-07-13 ENCOUNTER — Telehealth: Payer: Self-pay | Admitting: Physician Assistant

## 2022-07-13 NOTE — Telephone Encounter (Signed)
Orthopedic referral sent via [facility] to [facility]

## 2022-07-15 ENCOUNTER — Telehealth: Payer: Self-pay

## 2022-07-18 DIAGNOSIS — M48062 Spinal stenosis, lumbar region with neurogenic claudication: Secondary | ICD-10-CM | POA: Diagnosis not present

## 2022-07-18 DIAGNOSIS — M4807 Spinal stenosis, lumbosacral region: Secondary | ICD-10-CM | POA: Diagnosis not present

## 2022-07-19 ENCOUNTER — Telehealth: Payer: Self-pay | Admitting: Physician Assistant

## 2022-07-19 NOTE — Telephone Encounter (Signed)
Orthopedic appointment>> 07/18/22 with KC-Toni

## 2022-07-20 ENCOUNTER — Other Ambulatory Visit: Payer: Self-pay | Admitting: Physician Assistant

## 2022-07-20 ENCOUNTER — Encounter: Payer: Self-pay | Admitting: Physician Assistant

## 2022-07-20 DIAGNOSIS — M5442 Lumbago with sciatica, left side: Secondary | ICD-10-CM

## 2022-07-20 MED ORDER — GABAPENTIN 300 MG PO CAPS: 300.0000 mg | ORAL_CAPSULE | Freq: Three times a day (TID) | ORAL | 3 refills | Status: DC

## 2022-07-21 ENCOUNTER — Other Ambulatory Visit: Payer: Self-pay | Admitting: Orthopedic Surgery

## 2022-07-21 DIAGNOSIS — M4807 Spinal stenosis, lumbosacral region: Secondary | ICD-10-CM

## 2022-07-22 NOTE — Telephone Encounter (Signed)
Done

## 2022-08-05 ENCOUNTER — Ambulatory Visit
Admission: RE | Admit: 2022-08-05 | Discharge: 2022-08-05 | Disposition: A | Discharge status: Home / Self Care | Payer: Medicare Other | Source: Ambulatory Visit | Attending: Orthopedic Surgery | Admitting: Orthopedic Surgery

## 2022-08-05 DIAGNOSIS — M545 Low back pain, unspecified: Secondary | ICD-10-CM | POA: Diagnosis not present

## 2022-08-05 DIAGNOSIS — M48061 Spinal stenosis, lumbar region without neurogenic claudication: Secondary | ICD-10-CM | POA: Diagnosis not present

## 2022-08-05 DIAGNOSIS — M4807 Spinal stenosis, lumbosacral region: Secondary | ICD-10-CM

## 2022-08-08 ENCOUNTER — Ambulatory Visit: Payer: Medicare Other | Admitting: Physician Assistant

## 2022-08-16 DIAGNOSIS — M5416 Radiculopathy, lumbar region: Secondary | ICD-10-CM | POA: Diagnosis not present

## 2022-08-16 DIAGNOSIS — M48062 Spinal stenosis, lumbar region with neurogenic claudication: Secondary | ICD-10-CM | POA: Diagnosis not present

## 2022-08-23 ENCOUNTER — Other Ambulatory Visit: Payer: Self-pay | Admitting: Internal Medicine

## 2022-08-23 DIAGNOSIS — I10 Essential (primary) hypertension: Secondary | ICD-10-CM

## 2022-09-07 DIAGNOSIS — M5136 Other intervertebral disc degeneration, lumbar region: Secondary | ICD-10-CM | POA: Diagnosis not present

## 2022-09-07 DIAGNOSIS — M48062 Spinal stenosis, lumbar region with neurogenic claudication: Secondary | ICD-10-CM | POA: Diagnosis not present

## 2022-09-07 DIAGNOSIS — M5416 Radiculopathy, lumbar region: Secondary | ICD-10-CM | POA: Diagnosis not present

## 2022-09-12 DIAGNOSIS — M5416 Radiculopathy, lumbar region: Secondary | ICD-10-CM | POA: Diagnosis not present

## 2022-09-12 DIAGNOSIS — M48062 Spinal stenosis, lumbar region with neurogenic claudication: Secondary | ICD-10-CM | POA: Diagnosis not present

## 2022-09-23 DIAGNOSIS — M48061 Spinal stenosis, lumbar region without neurogenic claudication: Secondary | ICD-10-CM | POA: Diagnosis not present

## 2022-09-23 DIAGNOSIS — M5416 Radiculopathy, lumbar region: Secondary | ICD-10-CM | POA: Diagnosis not present

## 2022-09-27 DIAGNOSIS — M48061 Spinal stenosis, lumbar region without neurogenic claudication: Secondary | ICD-10-CM | POA: Diagnosis not present

## 2022-09-27 DIAGNOSIS — M5416 Radiculopathy, lumbar region: Secondary | ICD-10-CM | POA: Diagnosis not present

## 2022-09-30 DIAGNOSIS — M48061 Spinal stenosis, lumbar region without neurogenic claudication: Secondary | ICD-10-CM | POA: Diagnosis not present

## 2022-09-30 DIAGNOSIS — M5416 Radiculopathy, lumbar region: Secondary | ICD-10-CM | POA: Diagnosis not present

## 2022-10-03 DIAGNOSIS — M48062 Spinal stenosis, lumbar region with neurogenic claudication: Secondary | ICD-10-CM | POA: Diagnosis not present

## 2022-10-03 DIAGNOSIS — M47816 Spondylosis without myelopathy or radiculopathy, lumbar region: Secondary | ICD-10-CM | POA: Diagnosis not present

## 2022-10-03 DIAGNOSIS — M5416 Radiculopathy, lumbar region: Secondary | ICD-10-CM | POA: Diagnosis not present

## 2022-10-03 DIAGNOSIS — M5136 Other intervertebral disc degeneration, lumbar region: Secondary | ICD-10-CM | POA: Diagnosis not present

## 2022-10-04 DIAGNOSIS — M5416 Radiculopathy, lumbar region: Secondary | ICD-10-CM | POA: Diagnosis not present

## 2022-10-04 DIAGNOSIS — M48061 Spinal stenosis, lumbar region without neurogenic claudication: Secondary | ICD-10-CM | POA: Diagnosis not present

## 2022-10-07 DIAGNOSIS — M48061 Spinal stenosis, lumbar region without neurogenic claudication: Secondary | ICD-10-CM | POA: Diagnosis not present

## 2022-10-07 DIAGNOSIS — M5416 Radiculopathy, lumbar region: Secondary | ICD-10-CM | POA: Diagnosis not present

## 2022-10-14 DIAGNOSIS — M48061 Spinal stenosis, lumbar region without neurogenic claudication: Secondary | ICD-10-CM | POA: Diagnosis not present

## 2022-10-14 DIAGNOSIS — M5416 Radiculopathy, lumbar region: Secondary | ICD-10-CM | POA: Diagnosis not present

## 2022-11-08 DIAGNOSIS — M47816 Spondylosis without myelopathy or radiculopathy, lumbar region: Secondary | ICD-10-CM | POA: Diagnosis not present

## 2022-11-17 DIAGNOSIS — M48062 Spinal stenosis, lumbar region with neurogenic claudication: Secondary | ICD-10-CM | POA: Diagnosis not present

## 2022-11-17 DIAGNOSIS — M5136 Other intervertebral disc degeneration, lumbar region: Secondary | ICD-10-CM | POA: Diagnosis not present

## 2022-11-17 DIAGNOSIS — M47816 Spondylosis without myelopathy or radiculopathy, lumbar region: Secondary | ICD-10-CM | POA: Diagnosis not present

## 2022-11-17 DIAGNOSIS — M1611 Unilateral primary osteoarthritis, right hip: Secondary | ICD-10-CM | POA: Diagnosis not present

## 2022-11-21 ENCOUNTER — Encounter: Payer: Self-pay | Admitting: Physician Assistant

## 2022-11-21 ENCOUNTER — Ambulatory Visit (INDEPENDENT_AMBULATORY_CARE_PROVIDER_SITE_OTHER): Payer: Medicare Other | Admitting: Physician Assistant

## 2022-11-21 VITALS — BP 138/92 | HR 84 | Temp 98.3°F | Resp 16 | Ht 74.0 in | Wt 282.0 lb

## 2022-11-21 DIAGNOSIS — I10 Essential (primary) hypertension: Secondary | ICD-10-CM | POA: Diagnosis not present

## 2022-11-21 DIAGNOSIS — M5442 Lumbago with sciatica, left side: Secondary | ICD-10-CM

## 2022-11-21 DIAGNOSIS — M5441 Lumbago with sciatica, right side: Secondary | ICD-10-CM | POA: Diagnosis not present

## 2022-11-21 DIAGNOSIS — M25551 Pain in right hip: Secondary | ICD-10-CM | POA: Diagnosis not present

## 2022-11-21 MED ORDER — HYDRALAZINE HCL 10 MG PO TABS: 10.0000 mg | ORAL_TABLET | Freq: Three times a day (TID) | ORAL | 1 refills | Status: DC

## 2022-11-21 NOTE — Progress Notes (Signed)
[facility] [address] [place], [place] [address]  Internal MEDICINE  Office Visit Note  Patient Name: [name]  [id number]  [id number]  Date of Service: 11/21/2022  Chief Complaint  Patient presents with   Acute Visit    Uncontrolled bp     HPI Pt is here for a sick visit for high BP -he continues to have ongoing back pain and is seeing physiatry. 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 -Went for a MBB in hopes of ablation last week for his back pain, but couldn't locate the problematic nerve. He is going for another xray today of his hip as this may contribute.Taking advil and tylenol daily. 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 Takes more as needed/when he remembers. He did take it today along with his Lotrel -getting 140/80s at home if very relaxed, but otherwise it has been high -Unable to start on ozempic due to insurance, discussed working on diet to help both sugars and BP  Current Medication:  Outpatient Encounter Medications as of 11/21/2022  Medication Sig   ACCU-CHEK GUIDE test strip USE AS DIRECTED DAILY   amLODipine-benazepril (LOTREL) 10-40 MG capsule TAKE 1 CAPSULE BY MOUTH EVERY DAY   buPROPion (WELLBUTRIN XL) 150 MG 24 hr tablet Take 1 tablet (150 mg total) by mouth daily.   gabapentin (NEURONTIN) 300 MG capsule Take 1 capsule (300 mg total) by mouth 3 (three) times daily.   hydrALAZINE (APRESOLINE) 10 MG tablet Take 1 tablet (10 mg total) by mouth 3 (three) times daily. As needed for high BP.   hydrochlorothiazide (HYDRODIURIL) 25 MG tablet Take 1 tablet (25 mg total) by mouth daily.   omeprazole (PRILOSEC OTC) 20 MG tablet Take 20 mg by mouth daily as needed (heartburn).   Semaglutide,0.25 or 0.5MG /DOS, 2 MG/3ML SOPN Inject 0.25 mg into the skin once a week.    No facility-administered encounter medications on file as of 11/21/2022.      Medical History: Past Medical History:  Diagnosis Date   Cancer [facility])    prostate and skin   Degenerative arthritis of left knee    Depression    Essential hypertension    GERD (gastroesophageal reflux disease)    Low testosterone    Obesity (BMI 30.0-34.9)    Obstructive sleep apnea on CPAP    Pre-diabetes      Vital Signs: BP (!) 138/92 Comment: 150/90  Pulse 84   Temp 98.3 F (36.8 C)   Resp 16   Ht 6\' 2"  (1.88 m)   Wt 282 lb (127.9 kg)   SpO2 97%   BMI 36.21 kg/m    Review of Systems  Constitutional:  Negative for chills, fatigue and unexpected weight change.  HENT:  Negative for congestion, rhinorrhea, sneezing and sore throat.   Eyes:  Negative for redness.  Respiratory:  Negative for cough, chest tightness and shortness of breath.   Cardiovascular:  Negative for chest pain, palpitations and leg swelling.  Gastrointestinal:  Negative for abdominal pain, constipation, diarrhea, nausea and vomiting.  Genitourinary:  Negative for dysuria and frequency.  Musculoskeletal:  Positive for arthralgias and back pain. Negative for joint swelling and neck pain.  Skin:  Negative for rash.  Neurological: Negative.  Negative for tremors.  Hematological:  Negative for adenopathy.  Does not bruise/bleed easily.  Psychiatric/Behavioral:  Negative for behavioral problems (Depression), sleep disturbance and suicidal ideas. The patient is not nervous/anxious.     Physical Exam Vitals and nursing note reviewed.  Constitutional:      General: He is not in acute distress.    Appearance: Normal appearance. He is well-developed. He is obese. He is not diaphoretic.  HENT:     Head: Normocephalic and atraumatic.     Mouth/Throat:     Pharynx: No oropharyngeal exudate.  Eyes:     Pupils: Pupils are equal, round, and reactive to light.  Neck:     Thyroid: No thyromegaly.     Vascular: No JVD.      Trachea: No tracheal deviation.  Cardiovascular:     Rate and Rhythm: Normal rate and regular rhythm.     Heart sounds: Normal heart sounds. No murmur heard.    No friction rub. No gallop.  Pulmonary:     Effort: Pulmonary effort is normal. No respiratory distress.     Breath sounds: No wheezing or rales.  Chest:     Chest wall: No tenderness.  Abdominal:     General: Bowel sounds are normal.     Palpations: Abdomen is soft.  Musculoskeletal:        General: Normal range of motion.     Cervical back: Normal range of motion and neck supple.     Comments: Low back pain upon standing from seated position with radiation into both legs  Lymphadenopathy:     Cervical: No cervical adenopathy.  Skin:    General: Skin is warm and dry.  Neurological:     Mental Status: He is alert and oriented to person, place, and time.     Cranial Nerves: No cranial nerve deficit.  Psychiatric:        Behavior: Behavior normal.        Thought Content: Thought content normal.        Judgment: Judgment normal.       Assessment/Plan: 1. Essential hypertension Will continue lotrel and add hydralazine TID as needed. Patient will start by taking once daily and then take up to 2 additional doses as needed since he is not taking hydrochlorothiazide regularly. - hydrALAZINE (APRESOLINE) 10 MG tablet; Take 1 tablet (10 mg total) by mouth 3 (three) times daily. As needed for high BP.  Dispense: 90 tablet; Refill: 1  2. Acute bilateral low back pain with bilateral sciatica Followed by physiatry   General Counseling: [name] understanding of the findings of todays visit and agrees with plan of treatment. I have discussed any further diagnostic evaluation that may be needed or ordered today. We also reviewed his medications today. he has been encouraged to call the office with any questions or concerns that should arise related to todays visit.    Counseling:    No orders of the defined types  were placed in this encounter.   Meds ordered this encounter  Medications   hydrALAZINE (APRESOLINE) 10 MG tablet    Sig: Take 1 tablet (10 mg total) by mouth 3 (three) times daily. As needed for high BP.    Dispense:  90 tablet    Refill:  1    Time spent:30 Minutes

## 2022-12-04 ENCOUNTER — Other Ambulatory Visit: Payer: Self-pay | Admitting: Physician Assistant

## 2022-12-12 DIAGNOSIS — M48062 Spinal stenosis, lumbar region with neurogenic claudication: Secondary | ICD-10-CM | POA: Diagnosis not present

## 2022-12-12 DIAGNOSIS — M5416 Radiculopathy, lumbar region: Secondary | ICD-10-CM | POA: Diagnosis not present

## 2022-12-13 DIAGNOSIS — Z96653 Presence of artificial knee joint, bilateral: Secondary | ICD-10-CM | POA: Diagnosis not present

## 2022-12-13 DIAGNOSIS — Z96652 Presence of left artificial knee joint: Secondary | ICD-10-CM | POA: Diagnosis not present

## 2022-12-13 DIAGNOSIS — Z96651 Presence of right artificial knee joint: Secondary | ICD-10-CM | POA: Diagnosis not present

## 2022-12-14 ENCOUNTER — Other Ambulatory Visit: Payer: Self-pay | Admitting: Physician Assistant

## 2022-12-14 DIAGNOSIS — I10 Essential (primary) hypertension: Secondary | ICD-10-CM

## 2022-12-16 ENCOUNTER — Ambulatory Visit: Payer: Medicare Other | Admitting: Physician Assistant

## 2022-12-16 NOTE — Telephone Encounter (Signed)
Its ok to send 90 days he cancel his appt

## 2022-12-22 NOTE — Progress Notes (Unsigned)
Referring Physician:  Denton Lank, FNP [phone] [place],  [place] [address]  Primary Physician:  Carlean Jews, PA-C  History of Present Illness: 12/27/2022 [name] is here today with a chief complaint of low back pain that radiates into the right buttocks and leg and occasionally into the foot.  His leg pain gets worse after walking or standing.  It can be as bad as 10 out of 10.  The pain is in his right buttock.  It extends down his leg and causes substantial discomfort.  He gets some burning and tingling.  Sitting makes it better.  He uses a cane for help with his pain sometimes.  He has to bend forward to try to help with his right leg pain.  He can only walk about 5 minutes without pain.  Bowel/Bladder Dysfunction: none  Conservative measures: Physical therapy: participated in at [facility] from 09/23/22 to 12/14/22 and has been discharged Multimodal medical therapy including regular antiinflammatories: gabapentin  Injections: epidural steroid injections 11/08/2022: MBB to the bilateral L4-5 and L5-S1 facet joint (no relief, 8/10 to 1-2/10) 09/12/2022: Right S1 transforaminal ESI (1 day of relief) 08/16/2022: Bilateral S1 transforaminal ESI (complete relief of pain on the left and 90% relief of pain on the right for one week and the return of pain)   Past Surgery: denies  Monnie Anspach has no symptoms of cervical myelopathy.  The symptoms are causing a significant impact on the patient's life.   I have utilized the care everywhere function in epic to review the outside records available from external health systems.  Review of Systems:  A 10 point review of systems is negative, except for the pertinent positives and negatives detailed in the HPI.  Past Medical History: Past Medical History:  Diagnosis Date   Cancer (HCC)    prostate and skin   Degenerative arthritis of left knee    Depression    Essential hypertension    GERD  (gastroesophageal reflux disease)    Low testosterone    Obesity (BMI 30.0-34.9)    Obstructive sleep apnea on CPAP    Pre-diabetes     Past Surgical History: Past Surgical History:  Procedure Laterality Date   COLONOSCOPY WITH PROPOFOL N/A 07/01/2021   Procedure: COLONOSCOPY WITH PROPOFOL;  Surgeon: Midge Minium, MD;  Location: [facility] ENDOSCOPY;  Service: Endoscopy;  Laterality: N/A;   KNEE ARTHROPLASTY Left 12/08/2021   Procedure: COMPUTER ASSISTED TOTAL KNEE ARTHROPLASTY;  Surgeon: Donato Heinz, MD;  Location: [facility] ORS;  Service: Orthopedics;  Laterality: Left;   PROSTATECTOMY  2007   REPLACEMENT TOTAL KNEE Right 02/06/2013   TONSILLECTOMY Bilateral     Allergies: Allergies as of 12/27/2022   (No Known Allergies)    Medications:  Current Outpatient Medications:    ACCU-CHEK GUIDE test strip, USE AS DIRECTED DAILY, Disp: 100 strip, Rfl: 1   amLODipine-benazepril (LOTREL) 10-40 MG capsule, TAKE 1 CAPSULE BY MOUTH EVERY DAY, Disp: 90 capsule, Rfl: 1   buPROPion (WELLBUTRIN XL) 150 MG 24 hr tablet, TAKE 1 TABLET BY MOUTH EVERY DAY, Disp: 90 tablet, Rfl: 1   gabapentin (NEURONTIN) 300 MG capsule, Take 1 capsule (300 mg total) by mouth 3 (three) times daily., Disp: 90 capsule, Rfl: 3   hydrALAZINE (APRESOLINE) 10 MG tablet, TAKE 1 TABLET (10 MG TOTAL) BY MOUTH 3 (THREE) TIMES DAILY. AS NEEDED FOR HIGH BP., Disp: 270 tablet, Rfl: 1   hydrochlorothiazide (HYDRODIURIL) 25 MG tablet, Take 1 tablet (25 mg  total) by mouth daily., Disp: 90 tablet, Rfl: 3   omeprazole (PRILOSEC OTC) 20 MG tablet, Take 20 mg by mouth daily as needed (heartburn)., Disp: , Rfl:   Social History: Social History   Tobacco Use   Smoking status: Former    Types: Cigarettes   Smokeless tobacco: Former    Types: Associate Professor status: Never Used  Substance Use Topics   Alcohol use: Yes    Comment: social   Drug use: No    Family Medical History: Family History  Problem Relation Age of Onset    ALS Mother    Cancer Father    Cancer Paternal Grandfather     Physical Examination: Vitals:   12/27/22 1405  BP: (!) 162/90    General: Patient is in no apparent distress. Attention to examination is appropriate.  Neck:   Supple.  Full range of motion.  Respiratory: Patient is breathing without any difficulty.   NEUROLOGICAL:     Awake, alert, oriented to person, place, and time.  Speech is clear and fluent.   Cranial Nerves: Pupils equal round and reactive to light.  Facial tone is symmetric.  Facial sensation is symmetric. Shoulder shrug is symmetric. Tongue protrusion is midline.  There is no pronator drift.  Strength: Side Biceps Triceps Deltoid Interossei Grip Wrist Ext. Wrist Flex.  R 5 5 5 5 5 5 5   L 5 5 5 5 5 5 5    Side Iliopsoas Quads Hamstring PF DF EHL  R 5 5 5 5 5 5   L 5 5 5 5 5 5    Reflexes are 1+ and symmetric at the biceps, triceps, brachioradialis, patella and achilles.   Hoffman's is absent.   Bilateral upper and lower extremity sensation is intact to light touch.    No evidence of dysmetria noted.  Gait is antalgic.     Medical Decision Making  Imaging: MRI lumbar spine on August 05, 2022 Disc levels:   T12- L1: Unremarkable.   L1-L2: Disc narrowing and bulging with endplate ridging. Negative facets. Combined with epidural fat expansion there is mild thecal sac stenosis   L2-L3: Disc collapse and endplate degeneration with endplate ridging and disc bulging. Degenerative facet spurring on both sides. Epidural fat expansion. Advanced thecal sac stenosis. Moderate right foraminal narrowing.   L3-L4: Disc narrowing and bulging. Degenerative facet spurring and ligamentum flavum thickening. Combined with epidural fat expansion there is thecal sac compression. Moderate right more than left foraminal stenosis   L4-L5: Disc narrowing and bulging. Degenerative facet spurring and ligamentum flavum thickening. Compressive spinal  stenosis. Right-sided degenerative ganglion at the ligamentum flavum. Mild bilateral foraminal stenosis   L5-S1:Degenerative facet spurring. Disc narrowing and bulging. The canal and foramina are patent   IMPRESSION: 1. Generalized lumbar spine degeneration with hyperlordosis and L2-3, L3-4 retrolisthesis. 2. Degeneration and epidural fat causes thecal sac narrowing at L1-2 to L4-5, stenosis most advanced at L2-3 and L3-4. 3. Moderate foraminal narrowing on the right at L2-3 and L3-4.     Electronically Signed   By: Tiburcio Pea M.D.   On: 08/07/2022 05:48  I have personally reviewed the images and agree with the above interpretation.  Assessment and Plan: Mr. Dubie is a pleasant 67 y.o. 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  This is due to a combination of ligamentous hypertrophy, facet arthrosis, and epidural lipomatosis.  He has tried and failed conservative management including physical therapy, medications, and  injections.  At this point, no further conservative management is indicated.  I recommend an L2-5 decompression.  I discussed the planned procedure at length with the patient, including the risks, benefits, alternatives, and indications. The risks discussed include but are not limited to bleeding, infection, need for reoperation, spinal fluid leak, stroke, vision loss, anesthetic complication, coma, paralysis, and even death. I also described in detail that improvement was not guaranteed.  The patient expressed understanding of these risks, and asked that we proceed with surgery. I described the surgery in layman's terms, and gave ample opportunity for questions, which were answered to the best of my ability.     Thank you for involving me in the care of this patient.      Christabell Loseke K. Myer Haff MD, [facility]

## 2022-12-27 ENCOUNTER — Encounter: Payer: Self-pay | Admitting: Neurosurgery

## 2022-12-27 ENCOUNTER — Ambulatory Visit: Payer: Medicare Other | Admitting: Neurosurgery

## 2022-12-27 VITALS — BP 162/90 | Ht 74.0 in | Wt 286.0 lb

## 2022-12-27 DIAGNOSIS — M48062 Spinal stenosis, lumbar region with neurogenic claudication: Secondary | ICD-10-CM

## 2022-12-27 NOTE — Patient Instructions (Signed)
Please see below for information in regards to your upcoming surgery:  Available dates: 01/27/23 01/30/23 02/08/23 02/13/23 02/15/23  Planned surgery: L2-5 posterior spinal decompression   Surgery date: (see above) at [facility] - you will find out your arrival time the business day before your surgery.   Pre-op appointment at [facility] Pre-admit Testing: we will call you with a date/time for this. Pre-admit testing is located on the first floor of the [facility] building, [address] [facility] [address]. Please bring all prescriptions in the original prescription bottles to your appointment, even if you have reviewed medications by phone with a pharmacy representative. During this appointment, they will advise you which medications you can take the morning of surgery, and which medications you will need to hold for surgery. Pre-op labs may be done at your pre-op appointment. You are not required to fast for these labs. Should you need to change your pre-op appointment, please call Pre-admit testing at [phone].     Surgical clearance: we will send a clearance form to Lynn Ito, PA-C    Common restrictions after surgery: No bending, lifting, or twisting ("BLT"). Avoid lifting objects heavier than 10 pounds for the first 6 weeks after surgery. Where possible, avoid household activities that involve lifting, bending, reaching, pushing, or pulling such as laundry, vacuuming, grocery shopping, and childcare. Try to arrange for help from friends and family for these activities while you heal. Do not drive while taking prescription pain medication. Weeks 6 through 12 after surgery: avoid lifting more than 25 pounds.     How to contact us:  If you have any questions/concerns before or after surgery, you can reach [place] at [phone], or you can send a mychart message. We can be reached by phone or mychart 8am-4pm, Monday-Friday.  *Please note: Calls after  4pm are forwarded to a third party answering service. Mychart messages are not routinely monitored during evenings, weekends, and holidays. Please call our office to contact the answering service for urgent concerns during non-business hours.     If you have FMLA/disability paperwork, please drop it off or fax it to [phone], attention Patty.   Appointments/FMLA & disability paperwork: Patty & Cristin  Nurse: Royston Cowper  Medical assistants: Laurann Montana, & Lyla Son Physician Assistants: Manning Charity & Drake Leach Surgeons: Venetia Night, MD & Ernestine Mcmurray, MD

## 2022-12-27 NOTE — Addendum Note (Signed)
Addended by: Sharlot Gowda on: 12/27/2022 03:38 PM   Modules accepted: Orders

## 2022-12-28 ENCOUNTER — Other Ambulatory Visit: Payer: Self-pay

## 2022-12-28 ENCOUNTER — Telehealth: Payer: Self-pay

## 2022-12-28 DIAGNOSIS — Z01818 Encounter for other preprocedural examination: Secondary | ICD-10-CM

## 2022-12-29 ENCOUNTER — Telehealth: Payer: Self-pay | Admitting: Physician Assistant

## 2022-12-29 DIAGNOSIS — K080 Exfoliation of teeth due to systemic causes: Secondary | ICD-10-CM | POA: Diagnosis not present

## 2022-12-29 NOTE — Telephone Encounter (Signed)
Received [employer]eros[employer] form, Gave to [employer] for signature-nm

## 2022-12-30 ENCOUNTER — Ambulatory Visit (INDEPENDENT_AMBULATORY_CARE_PROVIDER_SITE_OTHER): Payer: Medicare Other | Admitting: Physician Assistant

## 2022-12-30 ENCOUNTER — Encounter: Payer: Self-pay | Admitting: Physician Assistant

## 2022-12-30 VITALS — BP 170/100 | HR 114 | Temp 98.7°F | Resp 16 | Ht 74.0 in | Wt 285.0 lb

## 2022-12-30 DIAGNOSIS — I10 Essential (primary) hypertension: Secondary | ICD-10-CM

## 2022-12-30 DIAGNOSIS — M5441 Lumbago with sciatica, right side: Secondary | ICD-10-CM | POA: Diagnosis not present

## 2022-12-30 DIAGNOSIS — R0981 Nasal congestion: Secondary | ICD-10-CM

## 2022-12-30 DIAGNOSIS — M5442 Lumbago with sciatica, left side: Secondary | ICD-10-CM

## 2022-12-30 NOTE — Progress Notes (Signed)
[facility] [address] [place], [place] [address]  Internal MEDICINE  Office Visit Note  Patient Name: [name]  [id number]  [id number]  Date of Service: 01/11/2023  Chief Complaint  Patient presents with   Follow-up   Depression   Gastroesophageal Reflux   Hypertension   Nasal Congestion   Cough   Generalized Body Aches    HPI Pt is here for routine follow up -BP very elevated this morning, took cold medication last night though with a decongestant that is likely driving this up further. Discussed using cold medication designed for those with high BP and he is aware of this, just had this other at home already and will try something new moving forward. -Not feeling well, achey, coughing, congestion. Will take covid test and notify office -going to be having back surgery next month, on leave of absence and is taking gabapentin now and is helping -Was not taking hydrochlorothiazide due to frequent urination and will restart now that he is not driving. Will take hydralzine as needed -going to be having labs for preop and does not require these be ordered today -Did receive surgical clearance form, pt will need BP improvement and will follow up in a few weeks to recheck once feeling better and taking new medication regimen  Current Medication: Outpatient Encounter Medications as of 12/30/2022  Medication Sig   ACCU-CHEK GUIDE test strip USE AS DIRECTED DAILY   amLODipine-benazepril (LOTREL) 10-40 MG capsule TAKE 1 CAPSULE BY MOUTH EVERY DAY   buPROPion (WELLBUTRIN XL) 150 MG 24 hr tablet TAKE 1 TABLET BY MOUTH EVERY DAY   gabapentin (NEURONTIN) 300 MG capsule Take 1 capsule (300 mg total) by mouth 3 (three) times daily.   hydrALAZINE (APRESOLINE) 10 MG tablet TAKE 1 TABLET (10 MG TOTAL) BY MOUTH 3 (THREE) TIMES DAILY. AS NEEDED FOR HIGH BP.   hydrochlorothiazide (HYDRODIURIL) 25 MG tablet Take 1 tablet (25 mg total) by mouth daily.   omeprazole (PRILOSEC OTC) 20  MG tablet Take 20 mg by mouth daily as needed (heartburn).   No facility-administered encounter medications on file as of 12/30/2022.    Surgical History: Past Surgical History:  Procedure Laterality Date   COLONOSCOPY WITH PROPOFOL N/A 07/01/2021   Procedure: COLONOSCOPY WITH PROPOFOL;  Surgeon: Midge Minium, MD;  Location: [facility] ENDOSCOPY;  Service: Endoscopy;  Laterality: N/A;   KNEE ARTHROPLASTY Left 12/08/2021   Procedure: COMPUTER ASSISTED TOTAL KNEE ARTHROPLASTY;  Surgeon: Donato Heinz, MD;  Location: [facility] ORS;  Service: Orthopedics;  Laterality: Left;   PROSTATECTOMY  2007   REPLACEMENT TOTAL KNEE Right 02/06/2013   TONSILLECTOMY Bilateral     Medical History: Past Medical History:  Diagnosis Date   Cancer (HCC)    prostate and skin   Degenerative arthritis of left knee    Depression    Essential hypertension    GERD (gastroesophageal reflux disease)    Low testosterone    Obesity (BMI 30.0-34.9)    Obstructive sleep apnea on CPAP    Pre-diabetes     Family History: Family History  Problem Relation Age of Onset   ALS Mother    Cancer Father    Cancer Paternal Grandfather     Social History   Socioeconomic History   Marital status: Married    Spouse name: Aurea Graff   Number of children: 1   Years of education: Not on file   Highest education level: Not on file  Occupational History   Not on file  Tobacco  Use   Smoking status: Former    Types: Cigarettes   Smokeless tobacco: Former    Types: Associate Professor status: Never Used  Substance and Sexual Activity   Alcohol use: Yes    Comment: social   Drug use: No   Sexual activity: Not on file  Other Topics Concern   Not on file  Social History Narrative   Not on file   Social Determinants of Health   Financial Resource Strain: Not on file  Food Insecurity: Not on file  Transportation Needs: Not on file  Physical Activity: Not on file  Stress: Not on file  Social Connections: Not on file   Intimate Partner Violence: Not on file      Review of Systems  Constitutional:  Positive for fatigue. Negative for chills and unexpected weight change.  HENT:  Positive for congestion. Negative for rhinorrhea, sneezing and sore throat.   Eyes:  Negative for redness.  Respiratory:  Positive for cough. Negative for chest tightness and shortness of breath.   Cardiovascular:  Negative for chest pain, palpitations and leg swelling.  Gastrointestinal:  Negative for abdominal pain, constipation, diarrhea, nausea and vomiting.  Genitourinary:  Negative for dysuria and frequency.  Musculoskeletal:  Positive for arthralgias, back pain and myalgias. Negative for joint swelling and neck pain.  Skin:  Negative for rash.  Neurological: Negative.  Negative for tremors.  Hematological:  Negative for adenopathy. Does not bruise/bleed easily.  Psychiatric/Behavioral:  Negative for behavioral problems (Depression), sleep disturbance and suicidal ideas. The patient is not nervous/anxious.     Vital Signs: BP (!) 170/100   Pulse (!) 114   Temp 98.7 F (37.1 C)   Resp 16   Ht 6\' 2"  (1.88 m)   Wt 285 lb (129.3 kg)   SpO2 96%   BMI 36.59 kg/m    Physical Exam Vitals and nursing note reviewed.  Constitutional:      General: He is not in acute distress.    Appearance: Normal appearance. He is well-developed. He is obese. He is not diaphoretic.  HENT:     Head: Normocephalic and atraumatic.     Nose: Congestion present.     Mouth/Throat:     Pharynx: No oropharyngeal exudate.  Eyes:     Pupils: Pupils are equal, round, and reactive to light.  Neck:     Thyroid: No thyromegaly.     Vascular: No JVD.     Trachea: No tracheal deviation.  Cardiovascular:     Rate and Rhythm: Normal rate and regular rhythm.     Heart sounds: Normal heart sounds. No murmur heard.    No friction rub. No gallop.  Pulmonary:     Effort: Pulmonary effort is normal.     Breath sounds: Normal breath sounds. No  rales.  Abdominal:     General: Bowel sounds are normal.     Palpations: Abdomen is soft.  Musculoskeletal:        General: Normal range of motion.     Cervical back: Normal range of motion and neck supple.     Comments: Low back pain upon standing from seated position with radiation into both legs  Lymphadenopathy:     Cervical: No cervical adenopathy.  Skin:    General: Skin is warm and dry.  Neurological:     Mental Status: He is alert and oriented to person, place, and time.     Cranial Nerves: No cranial nerve deficit.  Psychiatric:  Behavior: Behavior normal.        Thought Content: Thought content normal.        Judgment: Judgment normal.        Assessment/Plan: 1. Essential hypertension Very elevated, will start back on hydrochlorothiazide and will take hydralazine prn and monitor. Will avoid decongestants that may increase BP  2. Congestion of nasal sinus Will take covid test and will try an alternative OTC such as coricidin for high BP. Notify office if any new/worsening symptoms.  3. Acute bilateral low back pain with bilateral sciatica Plan for surgery next month and has preop labs already ordered by surgeon. Will need better BP control prior   General Counseling: javionne ruter understanding of the findings of todays visit and agrees with plan of treatment. I have discussed any further diagnostic evaluation that may be needed or ordered today. We also reviewed his medications today. he has been encouraged to call the office with any questions or concerns that should arise related to todays visit.    No orders of the defined types were placed in this encounter.   No orders of the defined types were placed in this encounter.   This patient was seen by Lynn Ito, PA-C in collaboration with Dr. Beverely Risen as a part of collaborative care agreement.   Total time spent:30 Minutes Time spent includes review of chart, medications, test results,  and follow up plan with the patient.      Dr Lyndon Code Internal medicine

## 2023-01-04 NOTE — Telephone Encounter (Signed)
Surgery is scheduled for 01/27/23

## 2023-01-16 ENCOUNTER — Encounter
Admission: RE | Admit: 2023-01-16 | Discharge: 2023-01-16 | Disposition: A | Discharge status: Home / Self Care | Payer: Medicare Other | Source: Ambulatory Visit | Attending: Neurosurgery | Admitting: Neurosurgery

## 2023-01-16 VITALS — BP 169/92 | HR 98 | Temp 98.7°F | Resp 18 | Ht 74.0 in | Wt 285.4 lb

## 2023-01-16 DIAGNOSIS — I10 Essential (primary) hypertension: Secondary | ICD-10-CM | POA: Insufficient documentation

## 2023-01-16 DIAGNOSIS — Z01810 Encounter for preprocedural cardiovascular examination: Secondary | ICD-10-CM | POA: Diagnosis not present

## 2023-01-16 DIAGNOSIS — Z01818 Encounter for other preprocedural examination: Secondary | ICD-10-CM | POA: Insufficient documentation

## 2023-01-16 DIAGNOSIS — Z01812 Encounter for preprocedural laboratory examination: Secondary | ICD-10-CM

## 2023-01-16 LAB — BASIC METABOLIC PANEL
Anion gap: 10 (ref 5–15)
BUN: 18 mg/dL (ref 8–23)
CO2: 24 mmol/L (ref 22–32)
Calcium: 8.7 mg/dL — ABNORMAL LOW (ref 8.9–10.3)
Chloride: 105 mmol/L (ref 98–111)
Creatinine, Ser: 1.37 mg/dL — ABNORMAL HIGH (ref 0.61–1.24)
GFR, Estimated: 57 mL/min — ABNORMAL LOW (ref >60)
Glucose, Bld: 117 mg/dL — ABNORMAL HIGH (ref 70–99)
Potassium: 3.9 mmol/L (ref 3.5–5.1)
Sodium: 139 mmol/L (ref 135–145)

## 2023-01-16 LAB — URINALYSIS, ROUTINE W REFLEX MICROSCOPIC
Bilirubin Urine: NEGATIVE
Glucose, UA: NEGATIVE mg/dL
Hgb urine dipstick: NEGATIVE
Ketones, ur: NEGATIVE mg/dL
Leukocytes,Ua: NEGATIVE
Nitrite: NEGATIVE
Protein, ur: NEGATIVE mg/dL
Specific Gravity, Urine: 1.012 (ref 1.005–1.030)
pH: 5 (ref 5.0–8.0)

## 2023-01-16 LAB — CBC
HCT: 46.7 % (ref 39.0–52.0)
Hemoglobin: 15.3 g/dL (ref 13.0–17.0)
MCH: 28.1 pg (ref 26.0–34.0)
MCHC: 32.8 g/dL (ref 30.0–36.0)
MCV: 85.8 fL (ref 80.0–100.0)
Platelets: 333 10*3/uL (ref 150–400)
RBC: 5.44 MIL/uL (ref 4.22–5.81)
RDW: 13.3 % (ref 11.5–15.5)
WBC: 8.6 10*3/uL (ref 4.0–10.5)
nRBC: 0 % (ref 0.0–0.2)

## 2023-01-16 LAB — TYPE AND SCREEN
ABO/RH(D): B POS
Antibody Screen: NEGATIVE

## 2023-01-16 LAB — SURGICAL PCR SCREEN
MRSA, PCR: NEGATIVE
Staphylococcus aureus: NEGATIVE

## 2023-01-16 NOTE — Patient Instructions (Signed)
Your procedure is scheduled on: Friday August 23 Report to the Registration Desk on the 1st floor of the [facility] To find out your arrival time, please call [phone] between 1PM - 3PM on: Thursday August  If your arrival time is 6:00 am, do not arrive before that time as the [facility] entrance doors do not open until 6:00 am.  REMEMBER: Instructions that are not followed completely may result in serious medical risk, up to and including death; or upon the discretion of your surgeon and anesthesiologist your surgery may need to be rescheduled.  Do not eat food after midnight the night before surgery.  No gum chewing or hard candies.  You may however, drink CLEAR liquids up to 2 hours before you are scheduled to arrive for your surgery. Do not drink anything within 2 hours of your scheduled arrival time.  Clear liquids include: - water  - apple juice without pulp - gatorade (not RED colors) - black coffee or tea (Do NOT add milk or creamers to the coffee or tea) Do NOT drink anything that is not on this list.   One week prior to surgery: Stop Anti-inflammatories (NSAIDS) such as Advil, Aleve, Ibuprofen, Motrin, Naproxen, Naprosyn and Aspirin based products such as Excedrin, Goody's Powder, BC Powder. Stop ANY OVER THE COUNTER supplements until after surgery. You may however, continue to take Tylenol if needed for pain up until the day of surgery.  Continue taking all prescribed medications with the exception of the following: amLODipine-benazepril (LOTREL) hold the day of surgery, last dose Thursday August 22 hydrochlorothiazide (HYDRODIURIL) hold the day of surgery, last dose Thursday August 22   TAKE ONLY THESE MEDICATIONS THE MORNING OF SURGERY WITH A SIP OF WATER:  buPROPion (WELLBUTRIN XL)  hydrALAZINE (APRESOLINE) hold the day of surgery  omeprazole (PRILOSEC OTC)  (take one the night before and one on the morning of surgery - helps to prevent nausea after  surgery.)  No Alcohol for 24 hours before or after surgery.  No Smoking including e-cigarettes for 24 hours before surgery.  No chewable tobacco products for at least 6 hours before surgery.  No nicotine patches on the day of surgery.  Do not use any "recreational" drugs for at least a week (preferably 2 weeks) before your surgery.  Please be advised that the combination of cocaine and anesthesia may have negative outcomes, up to and including death. If you test positive for cocaine, your surgery will be cancelled.  On the morning of surgery brush your teeth with toothpaste and water, you may rinse your mouth with mouthwash if you wish. Do not swallow any toothpaste or mouthwash.  Use CHG Soap as directed on instruction sheet.  Do not wear jewelry, make-up, hairpins, clips or nail polish.  Do not wear lotions, powders, or perfumes.   Do not shave body hair from the neck down 48 hours before surgery.  Contact lenses, hearing aids and dentures may not be worn into surgery.  Do not bring valuables to the hospital. [facility] is not responsible for any missing/lost belongings or valuables.   Bring your C-PAP to the hospital in case you may have to spend the night.   Notify your doctor if there is any change in your medical condition (cold, fever, infection).  Wear comfortable clothing (specific to your surgery type) to the hospital.  After surgery, you can help prevent lung complications by doing breathing exercises.  Take deep breaths and cough every 1-2 hours.  If  you are being admitted to the hospital overnight, leave your suitcase in the car. After surgery it may be brought to your room.  In case of increased patient census, it may be necessary for you, the patient, to continue your postoperative care in the Same Day Surgery department.  If you are being discharged the day of surgery, you will not be allowed to drive home. You will need a responsible individual to drive you  home and stay with you for 24 hours after surgery.   If you are taking public transportation, you will need to have a responsible individual with you.  Please call the Pre-admissions Testing Dept. at [phone] if you have any questions about these instructions.  Surgery Visitation Policy:  Patients having surgery or a procedure may have two visitors.  Children under the age of 55 must have an adult with them who is not the patient.  Inpatient Visitation:    Visiting hours are 7 a.m. to 8 p.m. Up to four visitors are allowed at one time in a patient room. The visitors may rotate out with other people during the day.  One visitor age 84 or older may stay with the patient overnight and must be in the room by 8 p.m.         Preparing for Surgery with CHLORHEXIDINE GLUCONATE (CHG) Soap  Chlorhexidine Gluconate (CHG) Soap  o An antiseptic cleaner that kills germs and bonds with the skin to continue killing germs even after washing  o Used for showering the night before surgery and morning of surgery  Before surgery, you can play an important role by reducing the number of germs on your skin.  CHG (Chlorhexidine gluconate) soap is an antiseptic cleanser which kills germs and bonds with the skin to continue killing germs even after washing.  Please do not use if you have an allergy to CHG or antibacterial soaps. If your skin becomes reddened/irritated stop using the CHG.  1. Shower the NIGHT BEFORE SURGERY and the MORNING OF SURGERY with CHG soap.  2. If you choose to wash your hair, wash your hair first as usual with your normal shampoo.  3. After shampooing, rinse your hair and body thoroughly to remove the shampoo.  4. Use CHG as you would any other liquid soap. You can apply CHG directly to the skin and wash gently with a scrungie or a clean washcloth.  5. Apply the CHG soap to your body only from the neck down. Do not use on open wounds or open sores. Avoid contact with  your eyes, ears, mouth, and genitals (private parts). Wash face and genitals (private parts) with your normal soap.  6. Wash thoroughly, paying special attention to the area where your surgery will be performed.  7. Thoroughly rinse your body with warm water.  8. Do not shower/wash with your normal soap after using and rinsing off the CHG soap.  9. Pat yourself dry with a clean towel.  10. Wear clean pajamas to bed the night before surgery.  12. Place clean sheets on your bed the night of your first shower and do not sleep with pets.  13. Shower again with the CHG soap on the day of surgery prior to arriving at the hospital.  14. Do not apply any deodorants/lotions/powders.  15. Please wear clean clothes to the hospital.

## 2023-01-20 ENCOUNTER — Ambulatory Visit (INDEPENDENT_AMBULATORY_CARE_PROVIDER_SITE_OTHER): Payer: Medicare Other | Admitting: Physician Assistant

## 2023-01-20 VITALS — BP 140/85 | HR 96 | Temp 98.3°F | Ht 74.0 in | Wt 281.0 lb

## 2023-01-20 DIAGNOSIS — H938X2 Other specified disorders of left ear: Secondary | ICD-10-CM

## 2023-01-20 DIAGNOSIS — R0981 Nasal congestion: Secondary | ICD-10-CM

## 2023-01-20 DIAGNOSIS — I10 Essential (primary) hypertension: Secondary | ICD-10-CM | POA: Diagnosis not present

## 2023-01-20 MED ORDER — AMOXICILLIN-POT CLAVULANATE 875-125 MG PO TABS: 1.0000 | ORAL_TABLET | Freq: Two times a day (BID) | ORAL | 0 refills | Status: DC

## 2023-01-20 NOTE — Progress Notes (Signed)
[facility] [address] [place], [place] [address]  Internal MEDICINE  Office Visit Note  Patient Name: [name]  [id number]  [id number]  Date of Service: 01/20/2023  Chief Complaint  Patient presents with   nurse visit    Pt here for BP check      HPI Pt is here for a sick visit. He was originally on schedule for a nurse visit to recheck BP, however he does have a concern today -Did have covid after last visit and reports being mostly recovered now -he does have a little muffled hearing on left ear that started with covid, but has been more noticeable the last week or so. Did have a little pain with chewing a week ago but this resolved. Has tried using some OTC drops. Thought maybe wax build up. No real pressure or pain now. Ear has been itchy so admits to scratching/messing with ear and may be a little irritated due to this, making TM visualization difficult. Some light discharge in left canal may be due to OTC drops -Does have back surgery next Friday, therefore will go ahead and treat with short course ABX in case of possible ear infection/secondary infection post covid -BP 130s/70s at home, taking hydralazine BID and doing well with this  Current Medication:  Outpatient Encounter Medications as of 01/20/2023  Medication Sig   ACCU-CHEK GUIDE test strip USE AS DIRECTED DAILY   amLODipine-benazepril (LOTREL) 10-40 MG capsule TAKE 1 CAPSULE BY MOUTH EVERY DAY (Patient taking differently: Take 1 capsule by mouth every morning.)   amoxicillin-clavulanate (AUGMENTIN) 875-125 MG tablet Take 1 tablet by mouth 2 (two) times daily. Take with food.   buPROPion (WELLBUTRIN XL) 150 MG 24 hr tablet TAKE 1 TABLET BY MOUTH EVERY DAY   gabapentin (NEURONTIN) 300 MG capsule Take 1 capsule (300 mg total) by mouth 3 (three) times daily.   hydrALAZINE (APRESOLINE) 10 MG tablet TAKE 1 TABLET (10 MG TOTAL) BY MOUTH 3 (THREE) TIMES DAILY. AS NEEDED FOR HIGH BP.    hydrochlorothiazide (HYDRODIURIL) 25 MG tablet Take 1 tablet (25 mg total) by mouth daily. (Patient taking differently: Take 25 mg by mouth every morning.)   omeprazole (PRILOSEC OTC) 20 MG tablet Take 20 mg by mouth daily as needed (heartburn).   No facility-administered encounter medications on file as of 01/20/2023.      Medical History: Past Medical History:  Diagnosis Date   Cancer [facility])    prostate and skin   Degenerative arthritis of left knee    Depression    Essential hypertension    GERD (gastroesophageal reflux disease)    Low testosterone    Obesity (BMI 30.0-34.9)    Obstructive sleep apnea on CPAP    Pre-diabetes      Vital Signs: BP (!) 140/85   Pulse 96   Temp 98.3 F (36.8 C)   Ht 6\' 2"  (1.88 m)   Wt 281 lb (127.5 kg)   SpO2 96%   BMI 36.08 kg/m    Review of Systems  Constitutional:  Negative for fatigue and fever.  HENT:  Positive for congestion, ear discharge and postnasal drip. Negative for mouth sores.   Respiratory:  Negative for shortness of breath.   Cardiovascular:  Negative for chest pain.  Genitourinary:  Negative for flank pain.  Psychiatric/Behavioral: Negative.      Physical Exam Vitals and nursing note reviewed.  Constitutional:      Appearance: Normal appearance.  HENT:     Head: Normocephalic  and atraumatic.     Ears:     Comments: Some light discharge along left canal Eyes:     Extraocular Movements: Extraocular movements intact.     Pupils: Pupils are equal, round, and reactive to light.  Cardiovascular:     Rate and Rhythm: Normal rate and regular rhythm.  Pulmonary:     Effort: Pulmonary effort is normal.     Breath sounds: Normal breath sounds.  Skin:    General: Skin is warm and dry.  Neurological:     Mental Status: He is alert.       Assessment/Plan: 1. Ear pressure, left Will go ahead and treat with short course ABX prior to surgery, notify office if any new or worsening symptoms -  amoxicillin-clavulanate (AUGMENTIN) 875-125 MG tablet; Take 1 tablet by mouth 2 (two) times daily. Take with food.  Dispense: 10 tablet; Refill: 0  2. Congestion of nasal sinus Will go ahead and treat with short course ABX prior to surgery - amoxicillin-clavulanate (AUGMENTIN) 875-125 MG tablet; Take 1 tablet by mouth 2 (two) times daily. Take with food.  Dispense: 10 tablet; Refill: 0  3. Essential hypertension Borderline in office, but greatly improved and is now controlled at home. Will continue current medications and pt will move forward with surgery as planned   General Counseling: [name] understanding of the findings of todays visit and agrees with plan of treatment. I have discussed any further diagnostic evaluation that may be needed or ordered today. We also reviewed his medications today. he has been encouraged to call the office with any questions or concerns that should arise related to todays visit.    Counseling:    No orders of the defined types were placed in this encounter.   Meds ordered this encounter  Medications   amoxicillin-clavulanate (AUGMENTIN) 875-125 MG tablet    Sig: Take 1 tablet by mouth 2 (two) times daily. Take with food.    Dispense:  10 tablet    Refill:  0    Time spent:30 Minutes

## 2023-01-24 ENCOUNTER — Ambulatory Visit (INDEPENDENT_AMBULATORY_CARE_PROVIDER_SITE_OTHER): Payer: Medicare Other | Admitting: Nurse Practitioner

## 2023-01-24 ENCOUNTER — Encounter: Payer: Self-pay | Admitting: Nurse Practitioner

## 2023-01-24 VITALS — BP 135/88 | HR 92 | Temp 98.2°F | Resp 16 | Ht 74.0 in | Wt 285.2 lb

## 2023-01-24 DIAGNOSIS — H60392 Other infective otitis externa, left ear: Secondary | ICD-10-CM | POA: Diagnosis not present

## 2023-01-24 MED ORDER — OFLOXACIN 0.3 % OT SOLN: 5.0000 [drp] | Freq: Every day | OTIC | 0 refills | Status: AC

## 2023-01-24 MED ORDER — AMOXICILLIN-POT CLAVULANATE 875-125 MG PO TABS: 1.0000 | ORAL_TABLET | Freq: Two times a day (BID) | ORAL | 0 refills | Status: AC

## 2023-01-24 NOTE — Progress Notes (Signed)
[facility] [address] [place], [place] [address]  Internal MEDICINE  Office Visit Note  Patient Name: [name]  [id number]  [id number]  Date of Service: 01/24/2023  Chief Complaint  Patient presents with   Acute Visit   Ear Pain    Left ear pain has not gone away, on 5th day of Amoxicillin.     HPI Dillard presents for an acute sick visit for left ear infection --onset was last week. Augmentin is not helping Feels like the left ear canal is swelling more and tender right in front of the ear as well.  Initially came on a few weeks ago.       Current Medication:  Outpatient Encounter Medications as of 01/24/2023  Medication Sig   ACCU-CHEK GUIDE test strip USE AS DIRECTED DAILY   amLODipine-benazepril (LOTREL) 10-40 MG capsule TAKE 1 CAPSULE BY MOUTH EVERY DAY (Patient taking differently: Take 1 capsule by mouth every morning.)   amoxicillin-clavulanate (AUGMENTIN) 875-125 MG tablet Take 1 tablet by mouth 2 (two) times daily for 5 days.   buPROPion (WELLBUTRIN XL) 150 MG 24 hr tablet TAKE 1 TABLET BY MOUTH EVERY DAY   gabapentin (NEURONTIN) 300 MG capsule Take 1 capsule (300 mg total) by mouth 3 (three) times daily.   hydrALAZINE (APRESOLINE) 10 MG tablet TAKE 1 TABLET (10 MG TOTAL) BY MOUTH 3 (THREE) TIMES DAILY. AS NEEDED FOR HIGH BP.   hydrochlorothiazide (HYDRODIURIL) 25 MG tablet Take 1 tablet (25 mg total) by mouth daily. (Patient taking differently: Take 25 mg by mouth every morning.)   ofloxacin (FLOXIN) 0.3 % OTIC solution Place 5 drops into the left ear daily for 5 days.   omeprazole (PRILOSEC OTC) 20 MG tablet Take 20 mg by mouth daily as needed (heartburn).   [DISCONTINUED] amoxicillin-clavulanate (AUGMENTIN) 875-125 MG tablet Take 1 tablet by mouth 2 (two) times daily. Take with food. (Patient not taking: Reported on 01/27/2023)   No facility-administered encounter medications on file as of 01/24/2023.      Medical History: Past Medical  History:  Diagnosis Date   Cancer [facility])    prostate and skin   Degenerative arthritis of left knee    Depression    Essential hypertension    GERD (gastroesophageal reflux disease)    Low testosterone    Obesity (BMI 30.0-34.9)    Obstructive sleep apnea on CPAP    Pre-diabetes      Vital Signs: BP 135/88   Pulse 92   Temp 98.2 F (36.8 C)   Resp 16   Ht 6\' 2"  (1.88 m)   Wt 285 lb 3.2 oz (129.4 kg)   SpO2 96%   BMI 36.62 kg/m    Review of Systems  Constitutional:  Positive for fatigue.  HENT:  Positive for congestion, ear discharge and ear pain.     Physical Exam Vitals reviewed.  Constitutional:      General: He is not in acute distress.    Appearance: Normal appearance. He is obese. He is not ill-appearing.  HENT:     Head: Normocephalic and atraumatic.     Right Ear: Hearing, tympanic membrane, ear canal and external ear normal.     Left Ear: Drainage, swelling and tenderness present.     Ears:     Comments: Unable to visualize TM on left ear. Neurological:     Mental Status: He is alert.       Assessment/Plan: 1. Other infective acute otitis externa of left ear Continue  augmentin for 5 more days and take ofloxacin ear drops.  - ofloxacin (FLOXIN) 0.3 % OTIC solution; Place 5 drops into the left ear daily for 5 days.  Dispense: 5 mL; Refill: 0 - amoxicillin-clavulanate (AUGMENTIN) 875-125 MG tablet; Take 1 tablet by mouth 2 (two) times daily for 5 days.  Dispense: 10 tablet; Refill: 0   General Counseling: Shirley verbalizes understanding of the findings of todays visit and agrees with plan of treatment. I have discussed any further diagnostic evaluation that may be needed or ordered today. We also reviewed his medications today. he has been encouraged to call the office with any questions or concerns that should arise related to todays visit.    Counseling:    No orders of the defined types were placed in this encounter.   Meds ordered this  encounter  Medications   ofloxacin (FLOXIN) 0.3 % OTIC solution    Sig: Place 5 drops into the left ear daily for 5 days.    Dispense:  5 mL    Refill:  0   amoxicillin-clavulanate (AUGMENTIN) 875-125 MG tablet    Sig: Take 1 tablet by mouth 2 (two) times daily for 5 days.    Dispense:  10 tablet    Refill:  0    Return if symptoms worsen or fail to improve.  [place] Controlled Substance Database was reviewed by me for overdose risk score (ORS)  Time spent:20 Minutes Time spent with patient included reviewing progress notes, labs, imaging studies, and discussing plan for follow up.   This patient was seen by Sallyanne Kuster, FNP-C in collaboration with Dr. Beverely Risen as a part of collaborative care agreement.  Jhoel Stieg R. Tedd Sias, MSN, FNP-C Internal Medicine

## 2023-01-25 ENCOUNTER — Ambulatory Visit: Payer: Medicare Other | Admitting: Nurse Practitioner

## 2023-01-27 ENCOUNTER — Ambulatory Visit: Payer: Medicare Other

## 2023-01-27 ENCOUNTER — Encounter
Admission: RE | Disposition: A | Discharge status: Home / Self Care | Payer: Self-pay | Source: Ambulatory Visit | Attending: Neurosurgery

## 2023-01-27 ENCOUNTER — Ambulatory Visit: Payer: Medicare Other | Admitting: Anesthesiology

## 2023-01-27 ENCOUNTER — Encounter: Payer: Self-pay | Admitting: Neurosurgery

## 2023-01-27 ENCOUNTER — Other Ambulatory Visit: Payer: Self-pay

## 2023-01-27 ENCOUNTER — Ambulatory Visit: Payer: Medicare Other | Admitting: Urgent Care

## 2023-01-27 ENCOUNTER — Observation Stay
Admission: RE | Admit: 2023-01-27 | Discharge: 2023-01-29 | Disposition: A | Discharge status: Home / Self Care | Payer: Medicare Other | Source: Ambulatory Visit | Attending: Neurosurgery | Admitting: Neurosurgery

## 2023-01-27 DIAGNOSIS — Z79899 Other long term (current) drug therapy: Secondary | ICD-10-CM | POA: Insufficient documentation

## 2023-01-27 DIAGNOSIS — Z96651 Presence of right artificial knee joint: Secondary | ICD-10-CM | POA: Diagnosis not present

## 2023-01-27 DIAGNOSIS — Z87891 Personal history of nicotine dependence: Secondary | ICD-10-CM | POA: Diagnosis not present

## 2023-01-27 DIAGNOSIS — Z9889 Other specified postprocedural states: Principal | ICD-10-CM

## 2023-01-27 DIAGNOSIS — M48062 Spinal stenosis, lumbar region with neurogenic claudication: Principal | ICD-10-CM | POA: Insufficient documentation

## 2023-01-27 DIAGNOSIS — Z8546 Personal history of malignant neoplasm of prostate: Secondary | ICD-10-CM | POA: Diagnosis not present

## 2023-01-27 DIAGNOSIS — M4326 Fusion of spine, lumbar region: Secondary | ICD-10-CM | POA: Diagnosis not present

## 2023-01-27 DIAGNOSIS — I10 Essential (primary) hypertension: Secondary | ICD-10-CM | POA: Insufficient documentation

## 2023-01-27 DIAGNOSIS — Z01818 Encounter for other preprocedural examination: Secondary | ICD-10-CM

## 2023-01-27 HISTORY — PX: LUMBAR LAMINECTOMY/DECOMPRESSION MICRODISCECTOMY: SHX5026

## 2023-01-27 SURGERY — LUMBAR LAMINECTOMY/DECOMPRESSION MICRODISCECTOMY 3 LEVELS
Anesthesia: General

## 2023-01-27 MED ORDER — BUPIVACAINE-EPINEPHRINE (PF) 0.5% -1:200000 IJ SOLN
INTRAMUSCULAR | Status: AC
  Filled 2023-01-27: qty 10

## 2023-01-27 MED ORDER — DROPERIDOL 2.5 MG/ML IJ SOLN: 0.6250 mg | Freq: Once | INTRAMUSCULAR | Status: DC | PRN

## 2023-01-27 MED ORDER — PROPOFOL 10 MG/ML IV BOLUS
INTRAVENOUS | Status: DC | PRN
  Administered 2023-01-27: 170 mg via INTRAVENOUS

## 2023-01-27 MED ORDER — PROPOFOL 10 MG/ML IV BOLUS
INTRAVENOUS | Status: AC
  Filled 2023-01-27: qty 20

## 2023-01-27 MED ORDER — DEXAMETHASONE SODIUM PHOSPHATE 10 MG/ML IJ SOLN
INTRAMUSCULAR | Status: AC
  Filled 2023-01-27: qty 1

## 2023-01-27 MED ORDER — METHYLPREDNISOLONE ACETATE 40 MG/ML IJ SUSP
INTRAMUSCULAR | Status: DC | PRN
  Administered 2023-01-27: 40 mg

## 2023-01-27 MED ORDER — LIDOCAINE HCL (CARDIAC) PF 100 MG/5ML IV SOSY
PREFILLED_SYRINGE | INTRAVENOUS | Status: DC | PRN
  Administered 2023-01-27: 100 mg via INTRAVENOUS

## 2023-01-27 MED ORDER — OXYCODONE HCL 5 MG PO TABS: 5.0000 mg | ORAL_TABLET | Freq: Once | ORAL | Status: DC | PRN

## 2023-01-27 MED ORDER — SODIUM CHLORIDE FLUSH 0.9 % IV SOLN
INTRAVENOUS | Status: AC
  Filled 2023-01-27: qty 20

## 2023-01-27 MED ORDER — PANTOPRAZOLE SODIUM 40 MG PO TBEC: 40.0000 mg | DELAYED_RELEASE_TABLET | Freq: Every day | ORAL | Status: DC | PRN

## 2023-01-27 MED ORDER — SODIUM CHLORIDE (PF) 0.9 % IJ SOLN
INTRAMUSCULAR | Status: DC | PRN
  Administered 2023-01-27: 60 mL

## 2023-01-27 MED ORDER — OXYCODONE HCL 5 MG/5ML PO SOLN: 5.0000 mg | Freq: Once | ORAL | Status: DC | PRN

## 2023-01-27 MED ORDER — SODIUM CHLORIDE FLUSH 0.9 % IV SOLN
INTRAVENOUS | Status: AC
  Filled 2023-01-27: qty 10

## 2023-01-27 MED ORDER — FENTANYL CITRATE (PF) 100 MCG/2ML IJ SOLN: 25.0000 ug | INTRAMUSCULAR | Status: DC | PRN

## 2023-01-27 MED ORDER — DOCUSATE SODIUM 100 MG PO CAPS
100.0000 mg | ORAL_CAPSULE | Freq: Two times a day (BID) | ORAL | Status: DC
  Administered 2023-01-27 – 2023-01-29 (×4): 100 mg via ORAL
  Filled 2023-01-27 (×4): qty 1

## 2023-01-27 MED ORDER — OXYCODONE HCL 5 MG PO TABS
5.0000 mg | ORAL_TABLET | ORAL | Status: DC | PRN
  Filled 2023-01-27: qty 1

## 2023-01-27 MED ORDER — ACETAMINOPHEN 10 MG/ML IV SOLN
INTRAVENOUS | Status: DC | PRN
  Administered 2023-01-27: 1000 mg via INTRAVENOUS

## 2023-01-27 MED ORDER — CHLORHEXIDINE GLUCONATE 0.12 % MT SOLN
OROMUCOSAL | Status: AC
  Filled 2023-01-27: qty 15

## 2023-01-27 MED ORDER — 0.9 % SODIUM CHLORIDE (POUR BTL) OPTIME
TOPICAL | Status: DC | PRN
  Administered 2023-01-27: 500 mL

## 2023-01-27 MED ORDER — MIDAZOLAM HCL 2 MG/2ML IJ SOLN
INTRAMUSCULAR | Status: AC
  Filled 2023-01-27: qty 2

## 2023-01-27 MED ORDER — LIDOCAINE HCL (PF) 2 % IJ SOLN
INTRAMUSCULAR | Status: AC
  Filled 2023-01-27: qty 5

## 2023-01-27 MED ORDER — ONDANSETRON HCL 4 MG/2ML IJ SOLN
INTRAMUSCULAR | Status: AC
  Filled 2023-01-27: qty 2

## 2023-01-27 MED ORDER — METHOCARBAMOL 1000 MG/10ML IJ SOLN: 500.0000 mg | Freq: Four times a day (QID) | INTRAVENOUS | Status: DC | PRN

## 2023-01-27 MED ORDER — PHENOL 1.4 % MT LIQD: 1.0000 | OROMUCOSAL | Status: DC | PRN

## 2023-01-27 MED ORDER — HYDROMORPHONE HCL 1 MG/ML IJ SOLN
INTRAMUSCULAR | Status: AC
  Filled 2023-01-27: qty 1

## 2023-01-27 MED ORDER — CEFAZOLIN IN SODIUM CHLORIDE 2-0.9 GM/100ML-% IV SOLN
3.0000 g | Freq: Once | INTRAVENOUS | Status: AC
  Administered 2023-01-27: 2 g via INTRAVENOUS
  Filled 2023-01-27: qty 200

## 2023-01-27 MED ORDER — ACETAMINOPHEN 10 MG/ML IV SOLN
INTRAVENOUS | Status: AC
  Filled 2023-01-27: qty 100

## 2023-01-27 MED ORDER — FENTANYL CITRATE (PF) 100 MCG/2ML IJ SOLN
INTRAMUSCULAR | Status: AC
  Filled 2023-01-27: qty 2

## 2023-01-27 MED ORDER — SUCCINYLCHOLINE CHLORIDE 200 MG/10ML IV SOSY
PREFILLED_SYRINGE | INTRAVENOUS | Status: AC
  Filled 2023-01-27: qty 10

## 2023-01-27 MED ORDER — KETAMINE HCL 50 MG/5ML IJ SOSY
PREFILLED_SYRINGE | INTRAMUSCULAR | Status: AC
  Filled 2023-01-27: qty 5

## 2023-01-27 MED ORDER — AMOXICILLIN-POT CLAVULANATE 875-125 MG PO TABS
1.0000 | ORAL_TABLET | Freq: Two times a day (BID) | ORAL | Status: DC
  Administered 2023-01-27 – 2023-01-29 (×4): 1 via ORAL
  Filled 2023-01-27 (×4): qty 1

## 2023-01-27 MED ORDER — DEXAMETHASONE SODIUM PHOSPHATE 10 MG/ML IJ SOLN
INTRAMUSCULAR | Status: DC | PRN
  Administered 2023-01-27: 10 mg via INTRAVENOUS

## 2023-01-27 MED ORDER — OMEPRAZOLE MAGNESIUM 20 MG PO TBEC: 20.0000 mg | DELAYED_RELEASE_TABLET | Freq: Every day | ORAL | Status: DC | PRN

## 2023-01-27 MED ORDER — SURGIFLO WITH THROMBIN (HEMOSTATIC MATRIX KIT) OPTIME
TOPICAL | Status: DC | PRN
  Administered 2023-01-27: 1 via TOPICAL

## 2023-01-27 MED ORDER — PROMETHAZINE HCL 25 MG/ML IJ SOLN: 6.2500 mg | INTRAMUSCULAR | Status: DC | PRN

## 2023-01-27 MED ORDER — MIDAZOLAM HCL 2 MG/2ML IJ SOLN
INTRAMUSCULAR | Status: DC | PRN
  Administered 2023-01-27: 2 mg via INTRAVENOUS

## 2023-01-27 MED ORDER — SODIUM CHLORIDE 0.9% FLUSH
3.0000 mL | Freq: Two times a day (BID) | INTRAVENOUS | Status: DC
  Administered 2023-01-27 – 2023-01-29 (×4): 3 mL via INTRAVENOUS

## 2023-01-27 MED ORDER — FENTANYL CITRATE (PF) 100 MCG/2ML IJ SOLN
INTRAMUSCULAR | Status: DC | PRN
  Administered 2023-01-27 (×4): 50 ug via INTRAVENOUS

## 2023-01-27 MED ORDER — SUCCINYLCHOLINE CHLORIDE 200 MG/10ML IV SOSY
PREFILLED_SYRINGE | INTRAVENOUS | Status: DC | PRN
  Administered 2023-01-27: 100 mg via INTRAVENOUS

## 2023-01-27 MED ORDER — ONDANSETRON HCL 4 MG PO TABS: 4.0000 mg | ORAL_TABLET | Freq: Four times a day (QID) | ORAL | Status: DC | PRN

## 2023-01-27 MED ORDER — SODIUM CHLORIDE 0.9 % IV SOLN: INTRAVENOUS | Status: DC

## 2023-01-27 MED ORDER — BUPIVACAINE LIPOSOME 1.3 % IJ SUSP
INTRAMUSCULAR | Status: AC
  Filled 2023-01-27: qty 20

## 2023-01-27 MED ORDER — POLYETHYLENE GLYCOL 3350 17 G PO PACK
17.0000 g | PACK | Freq: Every day | ORAL | Status: DC | PRN
  Administered 2023-01-28 – 2023-01-29 (×2): 17 g via ORAL
  Filled 2023-01-27 (×2): qty 1

## 2023-01-27 MED ORDER — SODIUM CHLORIDE 0.9 % IV SOLN: 250.0000 mL | INTRAVENOUS | Status: DC

## 2023-01-27 MED ORDER — GABAPENTIN 300 MG PO CAPS
300.0000 mg | ORAL_CAPSULE | Freq: Three times a day (TID) | ORAL | Status: DC
  Administered 2023-01-27 – 2023-01-29 (×5): 300 mg via ORAL
  Filled 2023-01-27 (×5): qty 1

## 2023-01-27 MED ORDER — MORPHINE SULFATE (PF) 2 MG/ML IV SOLN: 1.0000 mg | INTRAVENOUS | Status: AC | PRN

## 2023-01-27 MED ORDER — CEFAZOLIN SODIUM-DEXTROSE 2-4 GM/100ML-% IV SOLN
INTRAVENOUS | Status: AC
  Filled 2023-01-27: qty 100

## 2023-01-27 MED ORDER — ACETAMINOPHEN 10 MG/ML IV SOLN: 1000.0000 mg | Freq: Once | INTRAVENOUS | Status: DC | PRN

## 2023-01-27 MED ORDER — PHENYLEPHRINE 80 MCG/ML (10ML) SYRINGE FOR IV PUSH (FOR BLOOD PRESSURE SUPPORT)
PREFILLED_SYRINGE | INTRAVENOUS | Status: AC
  Filled 2023-01-27: qty 10

## 2023-01-27 MED ORDER — METHYLPREDNISOLONE ACETATE 40 MG/ML IJ SUSP
INTRAMUSCULAR | Status: AC
  Filled 2023-01-27: qty 1

## 2023-01-27 MED ORDER — BUPIVACAINE HCL (PF) 0.5 % IJ SOLN
INTRAMUSCULAR | Status: AC
  Filled 2023-01-27: qty 30

## 2023-01-27 MED ORDER — HYDROMORPHONE HCL 1 MG/ML IJ SOLN
INTRAMUSCULAR | Status: DC | PRN
  Administered 2023-01-27: .5 mg via INTRAVENOUS

## 2023-01-27 MED ORDER — KETAMINE HCL 10 MG/ML IJ SOLN
INTRAMUSCULAR | Status: DC | PRN
  Administered 2023-01-27: 50 mg via INTRAVENOUS

## 2023-01-27 MED ORDER — BISACODYL 10 MG RE SUPP: 10.0000 mg | Freq: Every day | RECTAL | Status: DC | PRN

## 2023-01-27 MED ORDER — HYDRALAZINE HCL 10 MG PO TABS
10.0000 mg | ORAL_TABLET | Freq: Three times a day (TID) | ORAL | Status: DC
  Administered 2023-01-27 – 2023-01-29 (×5): 10 mg via ORAL
  Filled 2023-01-27 (×6): qty 1

## 2023-01-27 MED ORDER — ENOXAPARIN SODIUM 40 MG/0.4ML IJ SOSY
40.0000 mg | PREFILLED_SYRINGE | INTRAMUSCULAR | Status: DC
  Administered 2023-01-28 – 2023-01-29 (×2): 40 mg via SUBCUTANEOUS
  Filled 2023-01-27 (×2): qty 0.4

## 2023-01-27 MED ORDER — OXYCODONE HCL 5 MG PO TABS: 10.0000 mg | ORAL_TABLET | ORAL | Status: DC | PRN

## 2023-01-27 MED ORDER — HYDROCHLOROTHIAZIDE 25 MG PO TABS
25.0000 mg | ORAL_TABLET | Freq: Every morning | ORAL | Status: AC
  Administered 2023-01-28 – 2023-01-29 (×2): 25 mg via ORAL
  Filled 2023-01-27 (×2): qty 1

## 2023-01-27 MED ORDER — SODIUM CHLORIDE 0.9% FLUSH: 3.0000 mL | INTRAVENOUS | Status: DC | PRN

## 2023-01-27 MED ORDER — BUPROPION HCL ER (XL) 150 MG PO TB24
150.0000 mg | ORAL_TABLET | Freq: Every day | ORAL | Status: DC
  Administered 2023-01-28 – 2023-01-29 (×2): 150 mg via ORAL
  Filled 2023-01-27 (×2): qty 1

## 2023-01-27 MED ORDER — LACTATED RINGERS IV SOLN: INTRAVENOUS | Status: DC

## 2023-01-27 MED ORDER — ORAL CARE MOUTH RINSE: 15.0000 mL | Freq: Once | OROMUCOSAL | Status: AC

## 2023-01-27 MED ORDER — SENNA 8.6 MG PO TABS
1.0000 | ORAL_TABLET | Freq: Two times a day (BID) | ORAL | Status: DC
  Administered 2023-01-27 – 2023-01-29 (×4): 8.6 mg via ORAL
  Filled 2023-01-27 (×4): qty 1

## 2023-01-27 MED ORDER — BUPIVACAINE-EPINEPHRINE (PF) 0.5% -1:200000 IJ SOLN
INTRAMUSCULAR | Status: DC | PRN
  Administered 2023-01-27: 8 mL

## 2023-01-27 MED ORDER — METHOCARBAMOL 500 MG PO TABS
500.0000 mg | ORAL_TABLET | Freq: Four times a day (QID) | ORAL | Status: DC | PRN
  Filled 2023-01-27: qty 1

## 2023-01-27 MED ORDER — CEFAZOLIN SODIUM-DEXTROSE 2-4 GM/100ML-% IV SOLN
2.0000 g | Freq: Three times a day (TID) | INTRAVENOUS | Status: AC
  Administered 2023-01-27 – 2023-01-28 (×2): 2 g via INTRAVENOUS
  Filled 2023-01-27 (×2): qty 100

## 2023-01-27 MED ORDER — MAGNESIUM CITRATE PO SOLN: 1.0000 | Freq: Once | ORAL | Status: DC | PRN

## 2023-01-27 MED ORDER — OFLOXACIN 0.3 % OP SOLN
5.0000 [drp] | Freq: Every day | OPHTHALMIC | Status: DC
  Administered 2023-01-27 – 2023-01-29 (×3): 5 [drp] via OTIC
  Filled 2023-01-27: qty 5

## 2023-01-27 MED ORDER — MENTHOL 3 MG MT LOZG: 1.0000 | LOZENGE | OROMUCOSAL | Status: DC | PRN

## 2023-01-27 MED ORDER — CHLORHEXIDINE GLUCONATE 0.12 % MT SOLN
15.0000 mL | Freq: Once | OROMUCOSAL | Status: AC
  Administered 2023-01-27: 15 mL via OROMUCOSAL

## 2023-01-27 MED ORDER — PHENYLEPHRINE HCL (PRESSORS) 10 MG/ML IV SOLN
INTRAVENOUS | Status: DC | PRN
  Administered 2023-01-27: 80 ug via INTRAVENOUS
  Administered 2023-01-27: 160 ug via INTRAVENOUS

## 2023-01-27 MED ORDER — ACETAMINOPHEN 500 MG PO TABS
1000.0000 mg | ORAL_TABLET | Freq: Four times a day (QID) | ORAL | Status: DC
  Administered 2023-01-27 – 2023-01-29 (×7): 1000 mg via ORAL
  Filled 2023-01-27 (×8): qty 2

## 2023-01-27 MED ORDER — ONDANSETRON HCL 4 MG/2ML IJ SOLN: 4.0000 mg | Freq: Four times a day (QID) | INTRAMUSCULAR | Status: DC | PRN

## 2023-01-27 SURGICAL SUPPLY — 44 items
ADH SKN CLS APL DERMABOND .7 (GAUZE/BANDAGES/DRESSINGS) ×1
AGENT HMST KT MTR STRL THRMB (HEMOSTASIS) ×1
BASIN KIT SINGLE STR (MISCELLANEOUS) ×1 IMPLANT
BUR NEURO DRILL SOFT 3.0X3.8M (BURR) ×1 IMPLANT
CNTNR URN SCR LID CUP LEK RST (MISCELLANEOUS) ×1 IMPLANT
CONT SPEC 4OZ STRL OR WHT (MISCELLANEOUS) ×1
DERMABOND ADVANCED .7 DNX12 (GAUZE/BANDAGES/DRESSINGS) ×1 IMPLANT
DRAPE C ARM PK CFD 31 SPINE (DRAPES) ×1 IMPLANT
DRAPE LAPAROTOMY 100X77 ABD (DRAPES) ×1 IMPLANT
DRAPE MICROSCOPE SPINE 48X150 (DRAPES) IMPLANT
DRSG OPSITE POSTOP 4X6 (GAUZE/BANDAGES/DRESSINGS) IMPLANT
DRSG TEGADERM 2-3/8X2-3/4 SM (GAUZE/BANDAGES/DRESSINGS) IMPLANT
ELECT EZSTD 165MM 6.5IN (MISCELLANEOUS) ×1
ELECT REM PT RETURN 9FT ADLT (ELECTROSURGICAL) ×1
ELECTRODE EZSTD 165MM 6.5IN (MISCELLANEOUS) ×1 IMPLANT
ELECTRODE REM PT RTRN 9FT ADLT (ELECTROSURGICAL) ×1 IMPLANT
EVACUATOR 1/8 PVC DRAIN (DRAIN) IMPLANT
GAUZE SPONGE 2X2 STRL 8-PLY (GAUZE/BANDAGES/DRESSINGS) IMPLANT
GLOVE BIOGEL PI IND STRL 6.5 (GLOVE) ×1 IMPLANT
GLOVE SURG SYN 6.5 ES PF (GLOVE) ×1 IMPLANT
GLOVE SURG SYN 6.5 PF PI (GLOVE) ×1 IMPLANT
GLOVE SURG SYN 8.5 E (GLOVE) ×3 IMPLANT
GLOVE SURG SYN 8.5 PF PI (GLOVE) ×3 IMPLANT
GOWN SRG LRG LVL 4 IMPRV REINF (GOWNS) ×1 IMPLANT
GOWN SRG XL LVL 3 NONREINFORCE (GOWNS) ×1 IMPLANT
GOWN STRL NON-REIN TWL XL LVL3 (GOWNS) ×1
GOWN STRL REIN LRG LVL4 (GOWNS) ×1
KIT SPINAL PRONEVIEW (KITS) ×1 IMPLANT
MANIFOLD NEPTUNE II (INSTRUMENTS) ×1 IMPLANT
MARKER SKIN DUAL TIP RULER LAB (MISCELLANEOUS) ×1 IMPLANT
NDL SAFETY ECLIP 18X1.5 (MISCELLANEOUS) ×1 IMPLANT
NS IRRIG 1000ML POUR BTL (IV SOLUTION) ×1 IMPLANT
PACK LAMINECTOMY ARMC (PACKS) ×1 IMPLANT
SURGIFLO W/THROMBIN 8M KIT (HEMOSTASIS) ×1 IMPLANT
SUT DVC VLOC 3-0 CL 6 P-12 (SUTURE) ×1 IMPLANT
SUT ETHILON 3-0 FS-10 30 BLK (SUTURE) ×1
SUT VIC AB 0 CT1 27 (SUTURE) ×1
SUT VIC AB 0 CT1 27XCR 8 STRN (SUTURE) ×1 IMPLANT
SUT VIC AB 2-0 CT1 18 (SUTURE) ×1 IMPLANT
SUTURE EHLN 3-0 FS-10 30 BLK (SUTURE) IMPLANT
SYR 30ML LL (SYRINGE) ×2 IMPLANT
SYR 3ML LL SCALE MARK (SYRINGE) ×1 IMPLANT
TRAP FLUID SMOKE EVACUATOR (MISCELLANEOUS) ×1 IMPLANT
WATER STERILE IRR 1000ML POUR (IV SOLUTION) ×2 IMPLANT

## 2023-01-27 NOTE — Transfer of Care (Signed)
Immediate Anesthesia Transfer of Care Note  Patient: Oneil Melman [facility]  Procedure(s) Performed: L2-5 POSTERIOR SPINAL DECOMPRESSION  Patient Location: PACU  Anesthesia Type:General  Level of Consciousness: awake, alert , and oriented  Airway & Oxygen Therapy: Patient Spontanous Breathing and Patient connected to nasal cannula oxygen  Post-op Assessment: Report given to RN and Post -op Vital signs reviewed and stable  Post vital signs: Reviewed and stable  Last Vitals:  Vitals Value Taken Time  BP 94/57 01/27/23 1737  Temp    Pulse 70 01/27/23 1741  Resp 16 01/27/23 1741  SpO2 94 % 01/27/23 1741  Vitals shown include unfiled device data.  Last Pain:  Vitals:   01/27/23 1118  TempSrc: Temporal  PainSc: 0-No pain         Complications: No notable events documented.

## 2023-01-27 NOTE — H&P (Signed)
Referring Physician:  No referring provider defined for this encounter.  Primary Physician:  Carlean Jews, PA-C  History of Present Illness: 01/27/2023 [name] presents with continued pain.  12/27/2022 [name] is here today with a chief complaint of low back pain that radiates into the right buttocks and leg and occasionally into the foot.  His leg pain gets worse after walking or standing.  It can be as bad as 10 out of 10.  The pain is in his right buttock.  It extends down his leg and causes substantial discomfort.  He gets some burning and tingling.  Sitting makes it better.  He uses a cane for help with his pain sometimes.  He has to bend forward to try to help with his right leg pain.  He can only walk about 5 minutes without pain.  Bowel/Bladder Dysfunction: none  Conservative measures: Physical therapy: participated in at [facility] from 09/23/22 to 12/14/22 and has been discharged Multimodal medical therapy including regular antiinflammatories: gabapentin  Injections: epidural steroid injections 11/08/2022: MBB to the bilateral L4-5 and L5-S1 facet joint (no relief, 8/10 to 1-2/10) 09/12/2022: Right S1 transforaminal ESI (1 day of relief) 08/16/2022: Bilateral S1 transforaminal ESI (complete relief of pain on the left and 90% relief of pain on the right for one week and the return of pain)   Past Surgery: denies  [name] has no symptoms of cervical myelopathy.  The symptoms are causing a significant impact on the patient's life.   I have utilized the care everywhere function in epic to review the outside records available from external health systems.  Review of Systems:  A 10 point review of systems is negative, except for the pertinent positives and negatives detailed in the HPI.  Past Medical History: Past Medical History:  Diagnosis Date   Cancer (HCC)    prostate and skin   Degenerative arthritis of left knee    Depression     Essential hypertension    GERD (gastroesophageal reflux disease)    Low testosterone    Obesity (BMI 30.0-34.9)    Obstructive sleep apnea on CPAP    Pre-diabetes     Past Surgical History: Past Surgical History:  Procedure Laterality Date   COLONOSCOPY WITH PROPOFOL N/A 07/01/2021   Procedure: COLONOSCOPY WITH PROPOFOL;  Surgeon: Midge Minium, MD;  Location: [facility] ENDOSCOPY;  Service: Endoscopy;  Laterality: N/A;   KNEE ARTHROPLASTY Left 12/08/2021   Procedure: COMPUTER ASSISTED TOTAL KNEE ARTHROPLASTY;  Surgeon: Donato Heinz, MD;  Location: [facility] ORS;  Service: Orthopedics;  Laterality: Left;   PROSTATECTOMY  2007   REPLACEMENT TOTAL KNEE Right 02/06/2013   TONSILLECTOMY Bilateral     Allergies: Allergies as of 12/28/2022   (No Known Allergies)    Medications:  Current Facility-Administered Medications:    ceFAZolin (ANCEF) IVPB 2g/100 mL premix, 3 g, Intravenous, Once, Venetia Night, MD   lactated ringers infusion, , Intravenous, Continuous, Yevette Edwards, MD, Last Rate: 10 mL/hr at 01/27/23 1121, New Bag at 01/27/23 1121  Social History: Social History   Tobacco Use   Smoking status: Former    Types: Cigarettes   Smokeless tobacco: Former    Types: Associate Professor status: Never Used  Substance Use Topics   Alcohol use: Yes    Comment: social   Drug use: No    Family Medical History: Family History  Problem Relation Age of Onset   ALS Mother  Cancer Father    Cancer Paternal Grandfather     Physical Examination: Vitals:   01/27/23 1118  BP: (!) 165/104  Pulse: 88  Resp: 18  Temp: (!) 97 F (36.1 C)  SpO2: 99%   Heart sounds normal no MRG. Chest Clear to Auscultation Bilaterally.  General: Patient is in no apparent distress. Attention to examination is appropriate.  Neck:   Supple.  Full range of motion.  Respiratory: Patient is breathing without any difficulty.   NEUROLOGICAL:     Awake, alert, oriented to person, place,  and time.  Speech is clear and fluent.   Cranial Nerves: Pupils equal round and reactive to light.  Facial tone is symmetric.  Facial sensation is symmetric. Shoulder shrug is symmetric. Tongue protrusion is midline.  There is no pronator drift.  Strength: Side Biceps Triceps Deltoid Interossei Grip Wrist Ext. Wrist Flex.  R 5 5 5 5 5 5 5   L 5 5 5 5 5 5 5    Side Iliopsoas Quads Hamstring PF DF EHL  R 5 5 5 5 5 5   L 5 5 5 5 5 5    Reflexes are 1+ and symmetric at the biceps, triceps, brachioradialis, patella and achilles.   Hoffman's is absent.   Bilateral upper and lower extremity sensation is intact to light touch.    No evidence of dysmetria noted.  Gait is antalgic.     Medical Decision Making  Imaging: MRI lumbar spine on August 05, 2022 Disc levels:   T12- L1: Unremarkable.   L1-L2: Disc narrowing and bulging with endplate ridging. Negative facets. Combined with epidural fat expansion there is mild thecal sac stenosis   L2-L3: Disc collapse and endplate degeneration with endplate ridging and disc bulging. Degenerative facet spurring on both sides. Epidural fat expansion. Advanced thecal sac stenosis. Moderate right foraminal narrowing.   L3-L4: Disc narrowing and bulging. Degenerative facet spurring and ligamentum flavum thickening. Combined with epidural fat expansion there is thecal sac compression. Moderate right more than left foraminal stenosis   L4-L5: Disc narrowing and bulging. Degenerative facet spurring and ligamentum flavum thickening. Compressive spinal stenosis. Right-sided degenerative ganglion at the ligamentum flavum. Mild bilateral foraminal stenosis   L5-S1:Degenerative facet spurring. Disc narrowing and bulging. The canal and foramina are patent   IMPRESSION: 1. Generalized lumbar spine degeneration with hyperlordosis and L2-3, L3-4 retrolisthesis. 2. Degeneration and epidural fat causes thecal sac narrowing at L1-2 to L4-5, stenosis most  advanced at L2-3 and L3-4. 3. Moderate foraminal narrowing on the right at L2-3 and L3-4.     Electronically Signed   By: Tiburcio Pea M.D.   On: 08/07/2022 05:48  I have personally reviewed the images and agree with the above interpretation.  Assessment and Plan: [name] is a pleasant 67 y.o. 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    He has tried and failed conservative management including physical therapy, medications, and injections.  At this point, no further conservative management is indicated.    We will proceed with L2-5 decompression.     Devin Texidor K. Myer Haff MD, [facility] Neurosurgery

## 2023-01-27 NOTE — Anesthesia Preprocedure Evaluation (Signed)
Anesthesia Evaluation  Patient identified by MRN, date of birth, ID band Patient awake    Reviewed: Allergy & Precautions, H&P , NPO status , Patient's Chart, lab work & pertinent test results, reviewed documented beta blocker date and time   Airway Mallampati: III  TM Distance: >3 FB Neck ROM: full    Dental  (+) Teeth Intact   Pulmonary sleep apnea and Continuous Positive Airway Pressure Ventilation , former smoker   Pulmonary exam normal        Cardiovascular Exercise Tolerance: Good hypertension, On Medications negative cardio ROS Normal cardiovascular exam Rhythm:regular Rate:Normal     Neuro/Psych  PSYCHIATRIC DISORDERS  Depression    negative neurological ROS     GI/Hepatic Neg liver ROS,GERD  Medicated,,  Endo/Other  negative endocrine ROS    Renal/GU negative Renal ROS  negative genitourinary   Musculoskeletal   Abdominal   Peds  Hematology negative hematology ROS (+)   Anesthesia Other Findings Past Medical History: No date: Cancer (HCC)     Comment:  prostate and skin No date: Degenerative arthritis of left knee No date: Depression No date: Essential hypertension No date: GERD (gastroesophageal reflux disease) No date: Low testosterone No date: Obesity (BMI 30.0-34.9) No date: Obstructive sleep apnea on CPAP No date: Pre-diabetes Past Surgical History: 07/01/2021: COLONOSCOPY WITH PROPOFOL; N/A     Comment:  Procedure: COLONOSCOPY WITH PROPOFOL;  Surgeon: Midge Minium, MD;  Location: [facility] ENDOSCOPY;  Service:               Endoscopy;  Laterality: N/A; 12/08/2021: KNEE ARTHROPLASTY; Left     Comment:  Procedure: COMPUTER ASSISTED TOTAL KNEE ARTHROPLASTY;                Surgeon: Donato Heinz, MD;  Location: [facility] ORS;                Service: Orthopedics;  Laterality: Left; 2007: PROSTATECTOMY 02/06/2013: REPLACEMENT TOTAL KNEE; Right No date: TONSILLECTOMY; Bilateral BMI    Body  Mass Index: 36.59 kg/m     Reproductive/Obstetrics negative OB ROS                             Anesthesia Physical Anesthesia Plan  ASA: 3  Anesthesia Plan: General ETT   Post-op Pain Management:    Induction:   PONV Risk Score and Plan: 3  Airway Management Planned: Video Laryngoscope Planned and Oral ETT  Additional Equipment:   Intra-op Plan:   Post-operative Plan:   Informed Consent: I have reviewed the patients History and Physical, chart, labs and discussed the procedure including the risks, benefits and alternatives for the proposed anesthesia with the patient or authorized representative who has indicated his/her understanding and acceptance.     Dental Advisory Given  Plan Discussed with: CRNA  Anesthesia Plan Comments:        Anesthesia Quick Evaluation

## 2023-01-27 NOTE — Discharge Instructions (Signed)
Your surgeon has performed an operation on your lumbar spine (low back) to relieve pressure on one or more nerves. Many times, patients feel better immediately after surgery and can "overdo it." Even if you feel well, it is important that you follow these activity guidelines. If you do not let your back heal properly from the surgery, you can increase the chance of a disc herniation and/or return of your symptoms. The following are instructions to help in your recovery once you have been discharged from the hospital.  * It is ok to take NSAIDs after surgery.  Activity    No bending, lifting, or twisting ("BLT"). Avoid lifting objects heavier than 10 pounds (gallon milk jug).  Where possible, avoid household activities that involve lifting, bending, pushing, or pulling such as laundry, vacuuming, grocery shopping, and childcare. Try to arrange for help from friends and family for these activities while your back heals.  Increase physical activity slowly as tolerated.  Taking short walks is encouraged, but avoid strenuous exercise. Do not jog, run, bicycle, lift weights, or participate in any other exercises unless specifically allowed by your doctor. Avoid prolonged sitting, including car rides.  Talk to your doctor before resuming sexual activity.  You should not drive until cleared by your doctor.  Until released by your doctor, you should not return to work or school.  You should rest at home and let your body heal.   You may shower three days after your surgery.  After showering, lightly dab your incision dry. Do not take a tub bath or go swimming for 3 weeks, or until approved by your doctor at your follow-up appointment.  If you smoke, we strongly recommend that you quit.  Smoking has been proven to interfere with normal healing in your back and will dramatically reduce the success rate of your surgery. Please contact [facility] (800-QUIT-NOW) and use the resources at www.[facility].com for  assistance in stopping smoking.  Surgical Incision   If you have a dressing on your incision, you may remove it three days after your surgery. Keep your incision area clean and dry.  If you have staples or stitches on your incision, you should have a follow up scheduled for removal. If you do not have staples or stitches, you will have steri-strips (small pieces of surgical tape) or Dermabond glue. The steri-strips/glue should begin to peel away within about a week (it is fine if the steri-strips fall off before then). If the strips are still in place one week after your surgery, you may gently remove them.  Diet            You may return to your usual diet. Be sure to stay hydrated.  When to Contact Us  Although your surgery and recovery will likely be uneventful, you may have some residual numbness, aches, and pains in your back and/or legs. This is normal and should improve in the next few weeks.  However, should you experience any of the following, contact us immediately: New numbness or weakness Pain that is progressively getting worse, and is not relieved by your pain medications or rest Bleeding, redness, swelling, pain, or drainage from surgical incision Chills or flu-like symptoms Fever greater than 101.0 F (38.3 C) Problems with bowel or bladder functions Difficulty breathing or shortness of breath Warmth, tenderness, or swelling in your calf  Contact Information How to contact us:  If you have any questions/concerns before or after surgery, you can reach us at [phone], or you can   send a mychart message. We can be reached by phone or mychart 8am-4pm, Monday-Friday.  *Please note: Calls after 4pm are forwarded to a third party answering service. Mychart messages are not routinely monitored during evenings, weekends, and holidays. Please call our office to contact the answering service for urgent concerns during non-business hours.  

## 2023-01-27 NOTE — Op Note (Signed)
Indications: [name] is suffering from lumbar stenosis causing neurogenic claudication (ICD10 M48.062). The patient tried and failed conservative management, prompting surgical intervention.  Findings: severe compression  Preoperative Diagnosis: Lumbar Stenosis with neurogenic claudication Postoperative Diagnosis: same   EBL: 25 ml IVF:see anesthesia record Drains: 1 placed Disposition: Extubated and Stable to PACU Complications: none  No foley catheter was placed.   Preoperative Note:  Risks of surgery discussed include: infection, bleeding, stroke, coma, death, paralysis, CSF leak, nerve/spinal cord injury, numbness, tingling, weakness, complex regional pain syndrome, recurrent stenosis and/or disc herniation, vascular injury, development of instability, neck/back pain, need for further surgery, persistent symptoms, development of deformity, and the risks of anesthesia. The patient understood these risks and agreed to proceed.  Operative Note:   1. L2-5 lumbar decompression including central laminectomy and bilateral medial facetectomies including foraminotomies  The patient was then brought from the preoperative center with intravenous access established.  The patient underwent general anesthesia and endotracheal tube intubation, and was then rotated on the [place] rail top where all pressure points were appropriately padded.  The skin was then thoroughly cleansed.  Perioperative antibiotic prophylaxis was administered.  Sterile prep and drapes were then applied and a timeout was then observed.  C-arm was brought into the field under sterile conditions and under lateral visualization the L4-5 interspace was identified and marked.  The incision was marked on the right and injected with local anesthetic. Once this was complete a 6 cm incision was opened with the use of a #10 blade knife.    The metrx tubes were sequentially advanced and confirmed in position at L4-5. An 18mm  by 70mm tube was locked in place to the bed side attachment.  The microscope was then sterilely brought into the field and muscle creep was hemostased with a bipolar and resected with a pituitary rongeur.  A Bovie extender was then used to expose the spinous process and lamina.  Careful attention was placed to not violate the facet capsule. A 3 mm matchstick drill bit was then used to make a hemi-laminotomy trough until the ligamentum flavum was exposed.  This was extended to the base of the spinous process and to the contralateral side to remove all the central bone from each side.  Once this was complete and the underlying ligamentum flavum was visualized, it was dissected with a curette and resected with Kerrison rongeurs.  Extensive ligamentum hypertrophy was noted, requiring a substantial amount of time and care for removal.  The dura was identified and palpated. The kerrison rongeur was then used to remove the medial facet bilaterally until no compression was noted.  A balltip probe was used to confirm decompression of the ipsilateral L5 nerve root.  Additional attention was paid to completion of the contralateral L4-5 foraminotomy until the contralateral traversing nerve root was completely free.  Once this was complete, L4-5 central decompression including medial facetectomy and foraminotomy was confirmed and decompression on both sides was confirmed. No CSF leak was noted.  Depo-Medrol was placed on the nerve root.  The wound was copiously irrigated. The tube system was then removed under microscopic visualization and hemostasis was obtained with a bipolar.    After performing the decompression at L4-5, the metrx tubes were sequentially advanced and confirmed in position at L3-4. An 18mm by 70mm tube was locked in place to the bed side attachment.  Fluoroscopy was then removed from the field.  The microscope was then sterilely brought into the field and muscle creep  was hemostased with a bipolar and  resected with a pituitary rongeur.  A Bovie extender was then used to expose the spinous process and lamina.  Careful attention was placed to not violate the facet capsule. A 3 mm matchstick drill bit was then used to make a hemi-laminotomy trough until the ligamentum flavum was exposed.  This was extended to the base of the spinous process and to the contralateral side to remove all the central bone from each side.  Once this was complete and the underlying ligamentum flavum was visualized, it was dissected with a curette and resected with Kerrison rongeurs.  Extensive ligamentum hypertrophy was noted, requiring a substantial amount of time and care for removal.  The dura was identified and palpated. The kerrison rongeur was then used to remove the medial facet bilaterally until no compression was noted.  A balltip probe was used to confirm decompression of the ipsilateral L4 nerve root.  Additional attention was paid to completion of the contralateral foraminotomy until the contralateral L4 nerve root was completely free.  Once this was complete, L3-4 central decompression including medial facetectomy and foraminotomy was confirmed and decompression on both sides was confirmed. No CSF leak was noted.  Depo-Medrol was placed on the nerve root.   The wound was copiously irrigated. The tube system was then removed under microscopic visualization and hemostasis was obtained with a bipolar.    After performing the decompression at L3-4, the metrx tubes were sequentially advanced and confirmed in position at L2-3. An 18mm by 70mm tube was locked in place to the bed side attachment.  Fluoroscopy was then removed from the field.  The microscope was then sterilely brought into the field and muscle creep was hemostased with a bipolar and resected with a pituitary rongeur.  A Bovie extender was then used to expose the spinous process and lamina.  Careful attention was placed to not violate the facet capsule. A 3 mm  matchstick drill bit was then used to make a hemi-laminotomy trough until the ligamentum flavum was exposed.  This was extended to the base of the spinous process and to the contralateral side to remove all the central bone from each side.  Once this was complete and the underlying ligamentum flavum was visualized, it was dissected with a curette and resected with Kerrison rongeurs.  Extensive ligamentum hypertrophy was noted, requiring a substantial amount of time and care for removal.  The dura was identified and palpated. The kerrison rongeur was then used to remove the medial facet bilaterally until no compression was noted.  A balltip probe was used to confirm decompression of the ipsilateral L3 nerve root.  Additional attention was paid to completion of the contralateral foraminotomy until the contralateral L3 nerve root was completely free.  Once this was complete, L2-3 central decompression including medial facetectomy and foraminotomy was confirmed and decompression on both sides was confirmed. No CSF leak was noted.  Depo-Medrol was placed on the nerve root.   The wound was copiously irrigated. The tube system was then removed under microscopic visualization and hemostasis was obtained with a bipolar.    A drain was placed.  The fascial layer was reapproximated with the use of a 0 Vicryl suture.  Subcutaneous tissue layer was reapproximated using 2-0 Vicryl suture.  3-0 monocryl was placed in subcuticular fashion. The skin was then cleansed and Dermabond was used to close the skin opening.  Patient was then rotated back to the preoperative bed awakened from anesthesia and taken  to recovery all counts are correct in this case.  I performed the entire procedure with the assistance of Manning Charity PA as an Designer, television/film set. An assistant was required for this procedure due to the complexity.  The assistant provided assistance in tissue manipulation and suction, and was required for the successful  and safe performance of the procedure. I performed the critical portions of the procedure.   Tyvon Eggenberger K. Myer Haff MD

## 2023-01-27 NOTE — Progress Notes (Signed)
   01/27/23 1200  Spiritual Encounters  Type of Visit Initial  Care provided to: Pt and family  Referral source Chaplain assessment  Reason for visit Routine spiritual support  OnCall Visit No  Spiritual Framework  Presenting Themes Courage hope and growth  Interventions  Spiritual Care Interventions Made Established relationship of care and support;Compassionate presence;Reflective listening;Encouragement  Intervention Outcomes  Outcomes Awareness of support  Spiritual Care Plan  Spiritual Care Issues Still Outstanding No further spiritual care needs at this time (see row info)   Chaplain spiritual support services remain available as the need arises.

## 2023-01-27 NOTE — Discharge Summary (Signed)
Physician Discharge Summary  Patient ID: [name] MRN: [id number] DOB/AGE: 02/25/1956 67 y.o.  Admit date: 01/27/2023 Discharge date: 01/27/2023  Admission Diagnoses:  Discharge Diagnoses:  Principal Problem:   History of lumbar laminectomy for spinal cord decompression Active Problems:   Lumbar stenosis with neurogenic claudication   Discharged Condition: {condition:18240}  Hospital Course: ***  Consults: {consultation:18241}  Significant Diagnostic Studies: {diagnostics:18242}  Treatments: {Tx:18249}  Discharge Exam: Blood pressure (!) 145/91, pulse 79, temperature 97.6 F (36.4 C), resp. rate 18, height 6\' 2"  (1.88 m), weight 129.3 kg, SpO2 95%. {physical LKGM:0102725}  Disposition:   Discharge Instructions     Incentive spirometry RT   Complete by: As directed       Allergies as of 01/27/2023   No Known Allergies   Med Rec must be completed prior to using this [facility]***       Follow-up Information     Drake Leach, PA-C Follow up on 02/09/2023.   Specialty: Neurosurgery Contact information: [address] [place] [address] [phone]                 Signed: Susanne Borders 01/27/2023, 8:54 PM

## 2023-01-27 NOTE — Anesthesia Postprocedure Evaluation (Signed)
Anesthesia Post Note  Patient: [name] [facility]  Procedure(s) Performed: L2-5 POSTERIOR SPINAL DECOMPRESSION  Patient location during evaluation: PACU Anesthesia Type: General Level of consciousness: awake and alert Pain management: pain level controlled Vital Signs Assessment: post-procedure vital signs reviewed and stable Respiratory status: spontaneous breathing, nonlabored ventilation, respiratory function stable and patient connected to nasal cannula oxygen Cardiovascular status: blood pressure returned to baseline and stable Postop Assessment: no apparent nausea or vomiting Anesthetic complications: no  No notable events documented.   Last Vitals:  Vitals:   01/27/23 1815 01/27/23 1830  BP: 122/78 129/82  Pulse: 73 81  Resp: 13 17  Temp:  36.6 C  SpO2: 97% 93%    Last Pain:  Vitals:   01/27/23 1830  TempSrc:   PainSc: 0-No pain                 Stephanie Coup

## 2023-01-27 NOTE — Anesthesia Procedure Notes (Signed)
Procedure Name: Intubation Date/Time: 01/27/2023 2:32 PM  Performed by: Berniece Pap, CRNAPre-anesthesia Checklist: Patient identified, Emergency Drugs available, Suction available and Patient being monitored Patient Re-evaluated:Patient Re-evaluated prior to induction Oxygen Delivery Method: Circle system utilized Preoxygenation: Pre-oxygenation with 100% oxygen Induction Type: IV induction Ventilation: Mask ventilation without difficulty Laryngoscope Size: McGraph and 3 Grade View: Grade I Tube type: Oral Tube size: 7.5 mm Number of attempts: 1 Airway Equipment and Method: Stylet and Oral airway Placement Confirmation: ETT inserted through vocal cords under direct vision, positive ETCO2 and breath sounds checked- equal and bilateral Secured at: 22 cm Tube secured with: Tape Dental Injury: Teeth and Oropharynx as per pre-operative assessment

## 2023-01-28 ENCOUNTER — Encounter: Payer: Self-pay | Admitting: Neurosurgery

## 2023-01-28 DIAGNOSIS — M48062 Spinal stenosis, lumbar region with neurogenic claudication: Secondary | ICD-10-CM | POA: Diagnosis not present

## 2023-01-28 DIAGNOSIS — Z8546 Personal history of malignant neoplasm of prostate: Secondary | ICD-10-CM | POA: Diagnosis not present

## 2023-01-28 DIAGNOSIS — Z79899 Other long term (current) drug therapy: Secondary | ICD-10-CM | POA: Diagnosis not present

## 2023-01-28 DIAGNOSIS — I10 Essential (primary) hypertension: Secondary | ICD-10-CM | POA: Diagnosis not present

## 2023-01-28 DIAGNOSIS — Z87891 Personal history of nicotine dependence: Secondary | ICD-10-CM | POA: Diagnosis not present

## 2023-01-28 DIAGNOSIS — Z96651 Presence of right artificial knee joint: Secondary | ICD-10-CM | POA: Diagnosis not present

## 2023-01-28 NOTE — Progress Notes (Signed)
  Neurosurgery Progress Note    History: Pt. Is s/p L2-5 laminectomy.    POD1: Patient reports doing well this morning with minimal back pain and no pain in legs   Physical Exam:     AA Ox3  CNI   Strength:5/5 throughout bilateral lower extremity Incisions clean dry and intact with postoperative dressings in place Drain = 35 mL overnight      Assessment/Plan:   [name] is a 67 yo s/p L2-5 laminectomy for neurogenic claudication, radicular symptoms.   - mobilize - pain control - PTOT;  - likely d/c tomorrow   Karenann Cai MD, PhD

## 2023-01-28 NOTE — Plan of Care (Signed)
  Problem: Pain Management: Goal: Pain level will decrease Outcome: Progressing   Problem: Skin Integrity: Goal: Will show signs of wound healing Outcome: Progressing   Problem: Clinical Measurements: Goal: Ability to maintain clinical measurements within normal limits will improve Outcome: Progressing Goal: Postoperative complications will be avoided or minimized Outcome: Progressing Goal: Diagnostic test results will improve Outcome: Progressing   Problem: Safety: Goal: Ability to remain free from injury will improve Outcome: Progressing   Problem: Elimination: Goal: Will not experience complications related to bowel motility Outcome: Progressing Goal: Will not experience complications related to urinary retention Outcome: Progressing

## 2023-01-28 NOTE — Evaluation (Signed)
Occupational Therapy Evaluation Patient Details Name: [name] MRN: [id number] DOB: 11/07/1955 Today's Date: 01/28/2023   History of Present Illness Pt is a 67 y.o. male who presents with chief complaint of low back pain that radiates into the right buttocks and leg and occasionally into the foot.  His leg pain gets worse after walking or standing.  It can be as bad as 10 out of 10.  The pain is in his right buttock.  It extends down his leg and causes substantial discomfort.  He gets some burning and tingling.  Sitting makes it better. POD 1 s/p L2-5 posterior spinal decompression.   Clinical Impression   Pt seen for OT evaluation this date.  Pt is s/p surgical intervention for L2-5 posterior spinal decompression.  He has no pain during session.  He has a history of bilateral knee replacements and is familiar with the use of adaptive equipment.  Pt seen for education and use of adaptive equipment for lower body dressing and able to demonstrate understanding and performed lower body dressing with equipment, otherwise he requires min assist.  Pt benefited from one session with OT with goals met and does not require further OT intervention.         If plan is discharge home, recommend the following: Assistance with cooking/housework    Functional Status Assessment  Patient has had a recent decline in their functional status and demonstrates the ability to make significant improvements in function in a reasonable and predictable amount of time.  Equipment Recommendations       Recommendations for Other Services       Precautions / Restrictions Precautions Precautions: Back;Fall Precaution Booklet Issued: No Restrictions Weight Bearing Restrictions: No      Mobility Bed Mobility               General bed mobility comments: up to the chair on arrival    Transfers Overall transfer level: Needs assistance Equipment used: Rolling walker (2 wheels) Transfers: Bed to  chair/wheelchair/BSC       Step pivot transfers: Contact guard assist            Balance Overall balance assessment: Needs assistance Sitting-balance support: Bilateral upper extremity supported, Feet supported Sitting balance-Leahy Scale: Good Sitting balance - Comments: Able to maintain seated balance at EOB during functional activities   Standing balance support: Bilateral upper extremity supported, During functional activity, Reliant on assistive device for balance Standing balance-Leahy Scale: Good Standing balance comment: Able to maintain static standing balance with no difficulties or reports of pain                           ADL either performed or assessed with clinical judgement   ADL Overall ADL's : Needs assistance/impaired Eating/Feeding: Independent   Grooming: Modified independent   Upper Body Bathing: Modified independent   Lower Body Bathing: Set up;Minimal assistance Lower Body Bathing Details (indicate cue type and reason): assist to reach feet Upper Body Dressing : Modified independent   Lower Body Dressing: Minimal assistance Lower Body Dressing Details (indicate cue type and reason): able to don shorts with use of reacher, assist with socks but has a sock aid from prior knee surgery (he let someone borrow so he may purchase another one) Toilet Transfer: Contact guard assist   Toileting- Clothing Manipulation and Hygiene: Modified independent       Functional mobility during ADLs: Contact guard assist;Rolling walker (2 wheels) General ADL  Comments: Review of adaptive equipment for lower body self care skills, reacher and sock aid.     Vision Baseline Vision/History: 1 Wears glasses       Perception         Praxis         Pertinent Vitals/Pain Pain Assessment Pain Assessment: No/denies pain     Extremity/Trunk Assessment Upper Extremity Assessment Upper Extremity Assessment: Overall WFL for tasks assessed   Lower  Extremity Assessment Lower Extremity Assessment: Defer to PT evaluation       Communication Communication Communication: No apparent difficulties   Cognition Arousal: Alert Behavior During Therapy: WFL for tasks assessed/performed Overall Cognitive Status: Within Functional Limits for tasks assessed                                       General Comments   Pt seen for lower body dressing techniques with the use of adaptive equipment, reacher and sock aid. Able to demonstrate understanding.  Pt able to transfer to toilet with min guard. He will have assistance at home from his wife.     Exercises     Shoulder Instructions      Home Living Family/patient expects to be discharged to:: Private residence Living Arrangements: Spouse/significant other Available Help at Discharge: Family;Available 24 hours/day Type of Home: House Home Access: Stairs to enter [employer] of Steps: 3 Entrance Stairs-Rails: Right Home Layout: Able to live on main level with bedroom/bathroom     Bathroom Shower/Tub: Walk-in shower;Door   [employer] Toilet: Standard     Home Equipment: Agricultural consultant (2 wheels);Cane - single point   Additional Comments: PLOF using cane to amb and has RW from prior B TKR      Prior Functioning/Environment Prior Level of Function : Independent/Modified Independent;Driving             Mobility Comments: Mod independent using cane for amb ADLs Comments: independent prior to admission but has had progressive pain        OT Problem List: Decreased strength;Pain      OT Treatment/Interventions:      OT Goals(Current goals can be found in the care plan section) Acute Rehab OT Goals Patient Stated Goal: to go home and be as independent as possible OT Goal Formulation: With patient Time For Goal Achievement: 01/28/23 Potential to Achieve Goals: Good ADL Goals Pt Will Perform Lower Body Dressing: (P) with modified independence;with  adaptive equipment Pt Will Transfer to Toilet: (P) with modified independence  OT Frequency:      Co-evaluation              AM-PAC OT "6 Clicks" Daily Activity     Outcome Measure Help from another person eating meals?: None Help from another person taking care of personal grooming?: None Help from another person toileting, which includes using toliet, bedpan, or urinal?: None Help from another person bathing (including washing, rinsing, drying)?: A Little Help from another person to put on and taking off regular upper body clothing?: None Help from another person to put on and taking off regular lower body clothing?: A Little 6 Click Score: 22   End of Session Equipment Utilized During Treatment: Gait belt;Rolling walker (2 wheels)  Activity Tolerance: Patient tolerated treatment well Patient left: in chair;with call bell/phone within reach;with chair alarm set  OT Visit Diagnosis: Muscle weakness (generalized) (M62.81);Unsteadiness on feet (R26.81)  Time: 1610-9604 OT Time Calculation (min): 40 min Charges:  OT General Charges $OT Visit: 1 Visit OT Evaluation $OT Eval Low Complexity: 1 Low OT Treatments $Self Care/Home Management : 8-22 mins Mical Brun T Casidy Alberta, OTR/L, CLT Damien Batty 01/28/2023, 11:05 AM

## 2023-01-28 NOTE — Progress Notes (Signed)
Ambulated two rounds around nursing station.

## 2023-01-28 NOTE — Evaluation (Signed)
Physical Therapy Evaluation Patient Details Name: [name] MRN: [id number] DOB: 01-24-1956 Today's Date: 01/28/2023  History of Present Illness  Pt is a 67 y.o. male who presents with chief complaint of low back pain that radiates into the right buttocks and leg and occasionally into the foot.  His leg pain gets worse after walking or standing.  It can be as bad as 10 out of 10.  The pain is in his right buttock.  It extends down his leg and causes substantial discomfort.  He gets some burning and tingling.  Sitting makes it better. POD 1 s/p L2-5 posterior spinal decompression.  Clinical Impression   Pt is received in bed, he is agreeable to PT session. Pt reports 0/10 NPS at beginning of session. Pt performs bed mobility min A and transfers CGA for safety. Frequent cuing for hand placement, RW sequencing, and adherence to spinal precautions. Pt in recliner at end of session with RN at bedside. Educated Pt on spinal precautions-Pt verbalized understanding. Pt would benefit from skilled PT to address above deficits and promote optimal return to PLOF.       If plan is discharge home, recommend the following: A little help with walking and/or transfers;A little help with bathing/dressing/bathroom;Assist for transportation;Help with stairs or ramp for entrance   Can travel by private vehicle        Equipment Recommendations None recommended by PT  Recommendations for Other Services       Functional Status Assessment Patient has had a recent decline in their functional status and demonstrates the ability to make significant improvements in function in a reasonable and predictable amount of time.     Precautions / Restrictions Precautions Precautions: Back;Fall Precaution Booklet Issued: No Restrictions Weight Bearing Restrictions: No      Mobility  Bed Mobility Overal bed mobility: Needs Assistance Bed Mobility: Supine to Sit     Supine to sit: Min assist     General  bed mobility comments: cuing for adherence of spinal precaution and hand placement    Transfers Overall transfer level: Needs assistance Equipment used: Rolling walker (2 wheels) Transfers: Bed to chair/wheelchair/BSC     Step pivot transfers: Contact guard assist       General transfer comment: Cuing for sequencing on RW    Ambulation/Gait               General Gait Details: NT at this time due to fatigue  Stairs            Wheelchair Mobility     Tilt Bed    Modified Rankin (Stroke Patients Only)       Balance Overall balance assessment: Needs assistance Sitting-balance support: Bilateral upper extremity supported, Feet supported Sitting balance-Leahy Scale: Good Sitting balance - Comments: Able to maintain seated balance at EOB during functional activities   Standing balance support: Bilateral upper extremity supported, During functional activity, Reliant on assistive device for balance Standing balance-Leahy Scale: Good Standing balance comment: Able to maintain static standing balance with no difficulties or reports of pain                             Pertinent Vitals/Pain Pain Assessment Pain Assessment: No/denies pain    Home Living Family/patient expects to be discharged to:: Private residence Living Arrangements: Spouse/significant other Available Help at Discharge: Family;Available 24 hours/day Type of Home: House Home Access: Stairs to enter Entrance Stairs-Rails: Right Entrance Stairs-Number of  Steps: 3   Home Layout: Able to live on main level with bedroom/bathroom Home Equipment: Rolling Walker (2 wheels);Cane - single point Additional Comments: PLOF using cane to amb and has RW from prior B TKR    Prior Function Prior Level of Function : Independent/Modified Independent;Driving             Mobility Comments: Mod independent using cane for amb       Extremity/Trunk Assessment   Upper Extremity  Assessment Upper Extremity Assessment: Defer to OT evaluation    Lower Extremity Assessment Lower Extremity Assessment: Overall WFL for tasks assessed       Communication   Communication Communication: No apparent difficulties  Cognition Arousal: Alert Behavior During Therapy: WFL for tasks assessed/performed Overall Cognitive Status: Within Functional Limits for tasks assessed                                 General Comments: Pleasant and eager to move        General Comments      Exercises     Assessment/Plan    PT Assessment Patient needs continued PT services  PT Problem List Decreased strength;Decreased mobility;Decreased range of motion;Pain       PT Treatment Interventions Gait training;Stair training;Functional mobility training;Therapeutic activities;Therapeutic exercise    PT Goals (Current goals can be found in the Care Plan section)  Acute Rehab PT Goals Patient Stated Goal: to go home PT Goal Formulation: With patient Time For Goal Achievement: 02/11/23 Potential to Achieve Goals: Good    Frequency Min 1X/week     Co-evaluation               AM-PAC PT "6 Clicks" Mobility  Outcome Measure Help needed turning from your back to your side while in a flat bed without using bedrails?: A Little Help needed moving from lying on your back to sitting on the side of a flat bed without using bedrails?: A Little Help needed moving to and from a bed to a chair (including a wheelchair)?: A Little Help needed standing up from a chair using your arms (e.g., wheelchair or bedside chair)?: A Little Help needed to walk in hospital room?: A Little Help needed climbing 3-5 steps with a railing? : A Little 6 Click Score: 18    End of Session Equipment Utilized During Treatment: Gait belt Activity Tolerance: Patient tolerated treatment well Patient left: in chair;with call bell/phone within reach;with chair alarm set;with nursing/sitter in  room Nurse Communication: Mobility status PT Visit Diagnosis: Pain;Muscle weakness (generalized) (M62.81) Pain - Right/Left:  (bilateral) Pain - part of body:  (back)    Time: 1610-9604 PT Time Calculation (min) (ACUTE ONLY): 18 min   Charges:                 Elmon Else, SPT   Lemoyne Nestor 01/28/2023, 10:31 AM

## 2023-01-29 DIAGNOSIS — Z79899 Other long term (current) drug therapy: Secondary | ICD-10-CM | POA: Diagnosis not present

## 2023-01-29 DIAGNOSIS — I10 Essential (primary) hypertension: Secondary | ICD-10-CM | POA: Diagnosis not present

## 2023-01-29 DIAGNOSIS — Z87891 Personal history of nicotine dependence: Secondary | ICD-10-CM | POA: Diagnosis not present

## 2023-01-29 DIAGNOSIS — M48062 Spinal stenosis, lumbar region with neurogenic claudication: Secondary | ICD-10-CM | POA: Diagnosis not present

## 2023-01-29 DIAGNOSIS — Z96651 Presence of right artificial knee joint: Secondary | ICD-10-CM | POA: Diagnosis not present

## 2023-01-29 DIAGNOSIS — Z8546 Personal history of malignant neoplasm of prostate: Secondary | ICD-10-CM | POA: Diagnosis not present

## 2023-01-29 MED ORDER — METHOCARBAMOL 500 MG PO TABS: 500.0000 mg | ORAL_TABLET | Freq: Four times a day (QID) | ORAL | 0 refills | Status: DC | PRN

## 2023-01-29 MED ORDER — OXYCODONE HCL 5 MG PO TABS: 5.0000 mg | ORAL_TABLET | ORAL | 0 refills | Status: DC | PRN

## 2023-01-29 MED ORDER — SENNA 8.6 MG PO TABS: 1.0000 | ORAL_TABLET | Freq: Two times a day (BID) | ORAL | 0 refills | Status: DC

## 2023-01-29 NOTE — Plan of Care (Signed)
°  Problem: Acute Rehab PT Goals(only PT should resolve) Goal: Patient Will Transfer Sit To/From Stand Outcome: Adequate for Discharge Goal: Pt Will Transfer Bed To Chair/Chair To Bed Outcome: Adequate for Discharge Goal: Pt Will Ambulate Outcome: Adequate for Discharge Goal: Pt Will Go Up/Down Stairs Outcome: Adequate for Discharge

## 2023-01-29 NOTE — Progress Notes (Signed)
Reviewed discharge instructions with pt. Pt verbalized  understanding. Pt discharged with all personal belongings. Iv intact upon removal. Staff wheeled pt out. Pt transported to home via family vehicle.

## 2023-01-29 NOTE — Care Management Obs Status (Signed)
MEDICARE OBSERVATION STATUS NOTIFICATION   Patient Details  Name: [name] MRN: [id number] Date of Birth: [birth year]   Medicare Observation Status Notification Given:  Yes    Revanth Neidig E Cing Weaver, LCSW 01/29/2023, 11:31 AM

## 2023-01-29 NOTE — Progress Notes (Signed)
Physical Therapy Treatment Patient Details Name: [name] MRN: [id number] DOB: 1955/08/02 Today's Date: 01/29/2023   History of Present Illness Pt is a 67 y.o. male who presents with chief complaint of low back pain that radiates into the right buttocks and leg and occasionally into the foot.  His leg pain gets worse after walking or standing.  It can be as bad as 10 out of 10.  The pain is in his right buttock.  It extends down his leg and causes substantial discomfort.  He gets some burning and tingling.  Sitting makes it better. POD 1 s/p L2-5 posterior spinal decompression.    PT Comments  Pt is progressing steadily towards PT goals. Pt is received in bed, he is agreeable for PT session. Pt performs bed mobility and transfers with supervision, amb and stairs CGA for safety. Pt able to amb approx 325 ft, initially using SPC on R-side but quickly progressed to no AD. Additionally, Pt able to navigate 4 steps, twice using railing on R side with step-to progressing to step-thru pattern. No reports of low back pain throughout session. Pt cont to demonstrate good progress and understanding of spinal precautions. Pt would benefit from cont skilled PT to address above deficits and promote optimal return to PLOF.     If plan is discharge home, recommend the following: A little help with walking and/or transfers;A little help with bathing/dressing/bathroom;Assist for transportation;Help with stairs or ramp for entrance   Can travel by private vehicle        Equipment Recommendations  None recommended by PT    Recommendations for Other Services       Precautions / Restrictions Precautions Precautions: Back;Fall Precaution Booklet Issued: No Restrictions Weight Bearing Restrictions: No     Mobility  Bed Mobility Overal bed mobility: Needs Assistance Bed Mobility: Supine to Sit     Supine to sit: Supervision     General bed mobility comments: Improved ability to perform bed  mobility with no cuing    Transfers Overall transfer level: Needs assistance Equipment used: Straight cane Transfers: Sit to/from Stand Sit to Stand: Supervision           General transfer comment: Improved transfer performance with no cuing required    Ambulation/Gait Ambulation/Gait assistance: Contact guard assist Gait Distance (Feet): 325 Feet Assistive device: None, Straight cane Gait Pattern/deviations: Step-through pattern, Decreased step length - right, Decreased step length - left, Decreased stride length Gait velocity: Slightly decreased     General Gait Details: Initially started with using SPC on R side but progressed to no AD during amb   Stairs Stairs: Yes Stairs assistance: Contact guard assist   Number of Stairs: 8 General stair comments: 2x4 steps with R side railing   Wheelchair Mobility     Tilt Bed    Modified Rankin (Stroke Patients Only)       Balance Overall balance assessment: Needs assistance Sitting-balance support: Single extremity supported, Feet supported Sitting balance-Leahy Scale: Good Sitting balance - Comments: Able to maintain seated balance at EOB during functional activities   Standing balance support: During functional activity, Reliant on assistive device for balance, No upper extremity supported Standing balance-Leahy Scale: Good Standing balance comment: Able to maintain static standing balance with no difficulties or reports of pain                            Cognition Arousal: Alert Behavior During Therapy: [facility] for tasks  assessed/performed Overall Cognitive Status: Within Functional Limits for tasks assessed                                 General Comments: Pleasant and eager to move        Exercises      General Comments        Pertinent Vitals/Pain Pain Assessment Pain Assessment: No/denies pain    Home Living                          Prior Function             PT Goals (current goals can now be found in the care plan section) Acute Rehab PT Goals Patient Stated Goal: to go home PT Goal Formulation: With patient Time For Goal Achievement: 02/11/23 Potential to Achieve Goals: Good Progress towards PT goals: Progressing toward goals    Frequency    Min 1X/week      PT Plan      Co-evaluation              AM-PAC PT "6 Clicks" Mobility   Outcome Measure  Help needed turning from your back to your side while in a flat bed without using bedrails?: None Help needed moving from lying on your back to sitting on the side of a flat bed without using bedrails?: None Help needed moving to and from a bed to a chair (including a wheelchair)?: None Help needed standing up from a chair using your arms (e.g., wheelchair or bedside chair)?: None Help needed to walk in hospital room?: A Little Help needed climbing 3-5 steps with a railing? : A Little 6 Click Score: 22    End of Session Equipment Utilized During Treatment: Gait belt Activity Tolerance: Patient tolerated treatment well Patient left: in chair;with call bell/phone within reach;with chair alarm set Nurse Communication: Mobility status PT Visit Diagnosis: Pain;Muscle weakness (generalized) (M62.81)     Time: 9604-5409 PT Time Calculation (min) (ACUTE ONLY): 11 min  Charges:                            [name], SPT    Tehila Sokolow 01/29/2023, 8:51 AM

## 2023-01-29 NOTE — Plan of Care (Signed)
  Problem: Education: Goal: Ability to verbalize activity precautions or restrictions will improve Outcome: Completed/Met Goal: Knowledge of the prescribed therapeutic regimen will improve Outcome: Completed/Met Goal: Understanding of discharge needs will improve Outcome: Completed/Met   

## 2023-01-29 NOTE — TOC Initial Note (Addendum)
Transition of Care [facility]) - Initial/Assessment Note    Patient Details  Name: [name] MRN: [id number] Date of Birth: Mar 18, 1956  Transition of Care [facility]) CM/SW Contact:    Liliana Cline, LCSW Phone Number: 01/29/2023, 11:34 AM  Clinical Narrative:                 Spoke with patient regarding recs for HHPT. Patient is from home with his wife. Patient drives at baseline. PCP is Lynn Ito. [facility]. Patient has a cane and rolling walker at home.  Patient is agreeable to [facility]. No agency preference. Used [facility] in the past. Confirmed home address in chart is correct. Patient states he thinks he will DC today. Katina with [facility] accepted the patient.  Asked MD for [facility] orders.   Expected Discharge Plan: Home w Home Health Services Barriers to Discharge: Barriers Resolved   Patient Goals and CMS Choice Patient states their goals for this hospitalization and ongoing recovery are:: home with home health CMS Medicare.gov Compare Post Acute Care list provided to:: Patient Choice offered to / list presented to : Patient      Expected Discharge Plan and Services       Living arrangements for the past 2 months: Single Family Home                           HH Arranged: PT HH Agency: [facility] Date [facility] Agency Contacted: 01/29/23   Representative spoke with at [facility] Agency: Laurelyn Sickle  Prior Living Arrangements/Services Living arrangements for the past 2 months: Single Family Home Lives with:: Spouse Patient language and need for interpreter reviewed:: Yes Do you feel safe going back to the place where you live?: Yes      Need for Family Participation in Patient Care: Yes (Comment) Care giver support system in place?: Yes (comment) Current home services: DME Criminal Activity/Legal Involvement Pertinent to Current Situation/Hospitalization: No - Comment as needed  Activities of Daily Living Home Assistive Devices/Equipment: CPAP ADL  Screening (condition at time of admission) Patient's cognitive ability adequate to safely complete daily activities?: Yes Is the patient deaf or have difficulty hearing?: No Does the patient have difficulty seeing, even when wearing glasses/contacts?: No Does the patient have difficulty concentrating, remembering, or making decisions?: No Patient able to express need for assistance with ADLs?: Yes Does the patient have difficulty dressing or bathing?: No Independently performs ADLs?: Yes (appropriate for developmental age) Does the patient have difficulty walking or climbing stairs?: Yes Weakness of Legs: None Weakness of Arms/Hands: None  Permission Sought/Granted Permission sought to share information with : Facility Industrial/product designer granted to share information with : Yes, Verbal Permission Granted     Permission granted to share info w AGENCY: Home Health agencies        Emotional Assessment       Orientation: : Oriented to Self, Oriented to Situation, Oriented to Place, Oriented to  Time Alcohol / Substance Use: Not Applicable Psych Involvement: No (comment)  Admission diagnosis:  History of lumbar laminectomy for spinal cord decompression [Z98.890] Patient Active Problem List   Diagnosis Date Noted   History of lumbar laminectomy for spinal cord decompression 01/27/2023   Lumbar stenosis with neurogenic claudication 01/27/2023   Total knee replacement status 12/08/2021   Colon cancer screening    OSA on CPAP 08/13/2020   CPAP use counseling 08/13/2020   Obesity (BMI 30.0-34.9) 08/13/2020  Encounter for general adult medical examination with abnormal findings 03/24/2019   Dysuria 03/24/2019   Depression, recurrent (HCC) 10/04/2017   Moderate obesity 10/04/2017   Essential hypertension 06/15/2017   PCP:  Carlean Jews, PA-C [facility]   [phone] [place] [place] [address] [place] [address] Phone: [phone] Fax:  [phone]  [facility] [place], [place] - [facility] AT Portal to Registered [facility] Sites [address] [place] [address] Phone: [phone] Fax: [phone]     Social Determinants of Health (SDOH) Social History: SDOH Screenings   Food Insecurity: No Food Insecurity (01/27/2023)  Housing: Low Risk  (01/27/2023)  Transportation Needs: No Transportation Needs (01/27/2023)  Utilities: Not At Risk (01/27/2023)  Alcohol Screen: Low Risk  (12/30/2022)  Depression (PHQ2-9): Low Risk  (08/26/2021)  Tobacco Use: Medium Risk (01/27/2023)   SDOH Interventions:     Readmission Risk Interventions     No data to display

## 2023-01-31 DIAGNOSIS — Z85828 Personal history of other malignant neoplasm of skin: Secondary | ICD-10-CM | POA: Diagnosis not present

## 2023-01-31 DIAGNOSIS — R7303 Prediabetes: Secondary | ICD-10-CM | POA: Diagnosis not present

## 2023-01-31 DIAGNOSIS — G4733 Obstructive sleep apnea (adult) (pediatric): Secondary | ICD-10-CM | POA: Diagnosis not present

## 2023-01-31 DIAGNOSIS — I10 Essential (primary) hypertension: Secondary | ICD-10-CM | POA: Diagnosis not present

## 2023-01-31 DIAGNOSIS — Z4789 Encounter for other orthopedic aftercare: Secondary | ICD-10-CM | POA: Diagnosis not present

## 2023-01-31 DIAGNOSIS — M1712 Unilateral primary osteoarthritis, left knee: Secondary | ICD-10-CM | POA: Diagnosis not present

## 2023-01-31 DIAGNOSIS — Z981 Arthrodesis status: Secondary | ICD-10-CM | POA: Diagnosis not present

## 2023-01-31 DIAGNOSIS — I2510 Atherosclerotic heart disease of native coronary artery without angina pectoris: Secondary | ICD-10-CM | POA: Diagnosis not present

## 2023-01-31 DIAGNOSIS — K219 Gastro-esophageal reflux disease without esophagitis: Secondary | ICD-10-CM | POA: Diagnosis not present

## 2023-01-31 DIAGNOSIS — Z87891 Personal history of nicotine dependence: Secondary | ICD-10-CM | POA: Diagnosis not present

## 2023-01-31 DIAGNOSIS — Z8546 Personal history of malignant neoplasm of prostate: Secondary | ICD-10-CM | POA: Diagnosis not present

## 2023-01-31 DIAGNOSIS — M48062 Spinal stenosis, lumbar region with neurogenic claudication: Secondary | ICD-10-CM | POA: Diagnosis not present

## 2023-01-31 DIAGNOSIS — F32A Depression, unspecified: Secondary | ICD-10-CM | POA: Diagnosis not present

## 2023-02-03 ENCOUNTER — Telehealth: Payer: Self-pay

## 2023-02-03 NOTE — Telephone Encounter (Signed)
Spoke with pt due to we received  vital alert from [facility] his BP today 140/70 he said he is feeling much better and Bp is running good and relayed message  to lauren

## 2023-02-07 ENCOUNTER — Telehealth: Payer: Self-pay | Admitting: Neurosurgery

## 2023-02-07 NOTE — Progress Notes (Unsigned)
   REFERRING PHYSICIAN:  Alan Ripper [address] [place],  [place] [address]  DOS: 01/27/23 lumbar decompression/laminectomy L2-L5  HISTORY OF PRESENT ILLNESS: [name] is approximately 2 weeks status post L2-L5 lumbar decompression. Was given oxycodone and robaxin on discharge from the hospital.   He has minimal LBP. His preop right leg pain is gone! He notes intermittent twinges of pain in his buttocks region. He stopped the oxycodone a week ago. He is not taking robaxin. He stopped his neurontin as well. Overall, he is doing very well.    PHYSICAL EXAMINATION:  General: Patient is well developed, well nourished, calm, collected, and in no apparent distress.   NEUROLOGICAL:  General: In no acute distress.   Awake, alert, oriented to person, place, and time.  Pupils equal round and reactive to light.  Facial tone is symmetric.     Strength:            Side Iliopsoas Quads Hamstring PF DF EHL  R 5 5 5 5 5 5   L 5 5 5 5 5 5    Incision c/d/I, minimal swelling superior aspect of incision.  No signs of infection.    ROS (Neurologic):  Negative except as noted above  IMAGING: Nothing new to review.   ASSESSMENT/PLAN:  [name] is doing well s/p above surgery. Treatment options reviewed with patient and following plan made:   - I have advised the patient to lift up to 10 pounds until 6 weeks after surgery (follow up with Dr. Myer Haff).  - Reviewed wound care.  - No bending, twisting, or lifting.  - He remains out of work until his follow up. Paperwork filled out.  - Follow up as scheduled in 4 weeks and prn.   BP was slightly elevated. No symptoms of chest pain, shortness of breath, blurry vision, or headaches. He checks it daily at home and it is usually better.  He will recheck at home and call PCP if not improved. If he develops CP, SOB, blurry vision, or headaches, then he will go to ED.     Advised to contact the office if any questions or  concerns arise.  Drake Leach PA-C Department of neurosurgery

## 2023-02-07 NOTE — Telephone Encounter (Signed)
Spoke with Thu and gave him the verbal order for PT.

## 2023-02-07 NOTE — Telephone Encounter (Signed)
Thu from physical therapy has called to give verbal orders.  PT- 1 W 3, 2 W 3 , 1 W 2   Thu- [phone]

## 2023-02-09 ENCOUNTER — Ambulatory Visit (INDEPENDENT_AMBULATORY_CARE_PROVIDER_SITE_OTHER): Payer: Medicare Other | Admitting: Orthopedic Surgery

## 2023-02-09 ENCOUNTER — Encounter: Payer: Self-pay | Admitting: Orthopedic Surgery

## 2023-02-09 VITALS — BP 140/92 | Temp 98.5°F | Ht 74.0 in | Wt 276.0 lb

## 2023-02-09 DIAGNOSIS — Z09 Encounter for follow-up examination after completed treatment for conditions other than malignant neoplasm: Secondary | ICD-10-CM

## 2023-02-09 DIAGNOSIS — M48062 Spinal stenosis, lumbar region with neurogenic claudication: Secondary | ICD-10-CM

## 2023-02-09 DIAGNOSIS — Z9889 Other specified postprocedural states: Secondary | ICD-10-CM

## 2023-02-10 DIAGNOSIS — F32A Depression, unspecified: Secondary | ICD-10-CM | POA: Diagnosis not present

## 2023-02-10 DIAGNOSIS — Z4789 Encounter for other orthopedic aftercare: Secondary | ICD-10-CM | POA: Diagnosis not present

## 2023-02-10 DIAGNOSIS — Z87891 Personal history of nicotine dependence: Secondary | ICD-10-CM | POA: Diagnosis not present

## 2023-02-10 DIAGNOSIS — M1712 Unilateral primary osteoarthritis, left knee: Secondary | ICD-10-CM | POA: Diagnosis not present

## 2023-02-10 DIAGNOSIS — K219 Gastro-esophageal reflux disease without esophagitis: Secondary | ICD-10-CM | POA: Diagnosis not present

## 2023-02-10 DIAGNOSIS — M48062 Spinal stenosis, lumbar region with neurogenic claudication: Secondary | ICD-10-CM | POA: Diagnosis not present

## 2023-02-10 DIAGNOSIS — Z981 Arthrodesis status: Secondary | ICD-10-CM | POA: Diagnosis not present

## 2023-02-10 DIAGNOSIS — Z85828 Personal history of other malignant neoplasm of skin: Secondary | ICD-10-CM | POA: Diagnosis not present

## 2023-02-10 DIAGNOSIS — I2510 Atherosclerotic heart disease of native coronary artery without angina pectoris: Secondary | ICD-10-CM | POA: Diagnosis not present

## 2023-02-10 DIAGNOSIS — R7303 Prediabetes: Secondary | ICD-10-CM | POA: Diagnosis not present

## 2023-02-10 DIAGNOSIS — I10 Essential (primary) hypertension: Secondary | ICD-10-CM | POA: Diagnosis not present

## 2023-02-10 DIAGNOSIS — Z8546 Personal history of malignant neoplasm of prostate: Secondary | ICD-10-CM | POA: Diagnosis not present

## 2023-02-10 DIAGNOSIS — G4733 Obstructive sleep apnea (adult) (pediatric): Secondary | ICD-10-CM | POA: Diagnosis not present

## 2023-02-16 ENCOUNTER — Other Ambulatory Visit: Payer: Self-pay | Admitting: Physician Assistant

## 2023-02-16 DIAGNOSIS — I10 Essential (primary) hypertension: Secondary | ICD-10-CM

## 2023-03-07 ENCOUNTER — Ambulatory Visit (INDEPENDENT_AMBULATORY_CARE_PROVIDER_SITE_OTHER): Payer: Medicare Other | Admitting: Neurosurgery

## 2023-03-07 ENCOUNTER — Encounter: Payer: Self-pay | Admitting: Neurosurgery

## 2023-03-07 VITALS — BP 146/90 | Ht 74.0 in | Wt 276.0 lb

## 2023-03-07 DIAGNOSIS — M48062 Spinal stenosis, lumbar region with neurogenic claudication: Secondary | ICD-10-CM

## 2023-03-07 DIAGNOSIS — Z09 Encounter for follow-up examination after completed treatment for conditions other than malignant neoplasm: Secondary | ICD-10-CM

## 2023-03-07 NOTE — Progress Notes (Signed)
   REFERRING PHYSICIAN:  Alan Ripper [address] [place],  [place] [address]  DOS: 01/27/23 lumbar decompression/laminectomy L2-L5  HISTORY OF PRESENT ILLNESS: [name] is status post L2-L5 lumbar decompression.   His leg pain is completely improved.  He is still having some back soreness.    PHYSICAL EXAMINATION:  General: Patient is well developed, well nourished, calm, collected, and in no apparent distress.   NEUROLOGICAL:  General: In no acute distress.   Awake, alert, oriented to person, place, and time.  Pupils equal round and reactive to light.  Facial tone is symmetric.     Strength:            Side Iliopsoas Quads Hamstring PF DF EHL  R 5 5 5 5 5 5   L 5 5 5 5 5 5    Incision c/d/I, minimal swelling superior aspect of incision.  No signs of infection.    ROS (Neurologic):  Negative except as noted above  IMAGING: Nothing new to review.   ASSESSMENT/PLAN:  [name] is doing well s/p above surgery.  He has had some back soreness.  Will start some physical therapy.  We reviewed his activity limitations.  He is not ready to return to work.  Will see him back in clinic in approximately 6 weeks.    Venetia Night MD Department of neurosurgery

## 2023-03-09 DIAGNOSIS — F3342 Major depressive disorder, recurrent, in full remission: Secondary | ICD-10-CM | POA: Diagnosis not present

## 2023-03-15 DIAGNOSIS — H5203 Hypermetropia, bilateral: Secondary | ICD-10-CM | POA: Diagnosis not present

## 2023-03-15 DIAGNOSIS — H25013 Cortical age-related cataract, bilateral: Secondary | ICD-10-CM | POA: Diagnosis not present

## 2023-03-15 DIAGNOSIS — H5213 Myopia, bilateral: Secondary | ICD-10-CM | POA: Diagnosis not present

## 2023-03-15 DIAGNOSIS — E119 Type 2 diabetes mellitus without complications: Secondary | ICD-10-CM | POA: Diagnosis not present

## 2023-03-15 LAB — HM DIABETES EYE EXAM

## 2023-03-17 DIAGNOSIS — Z9889 Other specified postprocedural states: Secondary | ICD-10-CM | POA: Diagnosis not present

## 2023-03-17 DIAGNOSIS — M545 Low back pain, unspecified: Secondary | ICD-10-CM | POA: Diagnosis not present

## 2023-03-22 DIAGNOSIS — M545 Low back pain, unspecified: Secondary | ICD-10-CM | POA: Diagnosis not present

## 2023-03-22 DIAGNOSIS — Z9889 Other specified postprocedural states: Secondary | ICD-10-CM | POA: Diagnosis not present

## 2023-03-23 ENCOUNTER — Telehealth: Payer: Self-pay | Admitting: Neurosurgery

## 2023-03-23 ENCOUNTER — Ambulatory Visit: Payer: Medicare Other | Admitting: Physician Assistant

## 2023-03-23 NOTE — Telephone Encounter (Signed)
Yes, ok to fill out

## 2023-03-23 NOTE — Telephone Encounter (Signed)
L2-5 PSD on 01/27/23  Patient dropped of a RTW form. 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. He is requesting to return to work on 04/17/2023. Patient is aware of his limitations. Can I fill out the RTW form with 04/17/2023 date?

## 2023-03-24 DIAGNOSIS — M545 Low back pain, unspecified: Secondary | ICD-10-CM | POA: Diagnosis not present

## 2023-03-24 DIAGNOSIS — Z9889 Other specified postprocedural states: Secondary | ICD-10-CM | POA: Diagnosis not present

## 2023-03-24 NOTE — Telephone Encounter (Signed)
Patient will pick up form today

## 2023-03-29 DIAGNOSIS — Z9889 Other specified postprocedural states: Secondary | ICD-10-CM | POA: Diagnosis not present

## 2023-03-29 DIAGNOSIS — M545 Low back pain, unspecified: Secondary | ICD-10-CM | POA: Diagnosis not present

## 2023-03-31 DIAGNOSIS — M545 Low back pain, unspecified: Secondary | ICD-10-CM | POA: Diagnosis not present

## 2023-03-31 DIAGNOSIS — Z9889 Other specified postprocedural states: Secondary | ICD-10-CM | POA: Diagnosis not present

## 2023-04-11 ENCOUNTER — Encounter: Payer: Self-pay | Admitting: Family Medicine

## 2023-04-11 ENCOUNTER — Telehealth: Payer: Self-pay | Admitting: Neurosurgery

## 2023-04-11 NOTE — Telephone Encounter (Signed)
Patient is requesting a return to work note sent to him via [employer]. 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 Please advise

## 2023-04-11 NOTE — Telephone Encounter (Signed)
Patient notified note has been sent.

## 2023-04-12 DIAGNOSIS — M545 Low back pain, unspecified: Secondary | ICD-10-CM | POA: Diagnosis not present

## 2023-04-12 DIAGNOSIS — Z9889 Other specified postprocedural states: Secondary | ICD-10-CM | POA: Diagnosis not present

## 2023-04-14 NOTE — Progress Notes (Deleted)
   REFERRING PHYSICIAN:  Alan Ripper [address] [place],  [place] [address]  DOS: 01/27/23 lumbar decompression/laminectomy L2-L5  HISTORY OF PRESENT ILLNESS:  He was doing well at his last visit with some back soreness. No leg pain!  He has been going to PT for his back and has done 6 visits. He is back at work ***.        He has minimal LBP. His preop right leg pain is gone! He notes intermittent twinges of pain in his buttocks region. He stopped the oxycodone a week ago. He is not taking robaxin. He stopped his neurontin as well. Overall, he is doing very well.    PHYSICAL EXAMINATION:  General: Patient is well developed, well nourished, calm, collected, and in no apparent distress.   NEUROLOGICAL:  General: In no acute distress.   Awake, alert, oriented to person, place, and time.  Pupils equal round and reactive to light.  Facial tone is symmetric.     Strength:            Side Iliopsoas Quads Hamstring PF DF EHL  R 5 5 5 5 5 5   L 5 5 5 5 5 5    Incision well healed   ROS (Neurologic):  Negative except as noted above  IMAGING: Nothing new to review.   ASSESSMENT/PLAN:  [name] is doing well s/p above surgery. Treatment options reviewed with patient and following plan made:   - I have advised the patient to lift up to 10 pounds until 6 weeks after surgery (follow up with Dr. Myer Haff).  - Reviewed wound care.  - No bending, twisting, or lifting.  - He remains out of work until his follow up. Paperwork filled out.  - Follow up as scheduled in 4 weeks and prn.   BP was slightly elevated. No symptoms of chest pain, shortness of breath, blurry vision, or headaches. He checks it daily at home and it is usually better.  He will recheck at home and call PCP if not improved. If he develops CP, SOB, blurry vision, or headaches, then he will go to ED.     Advised to contact the office if any questions or concerns arise.  Drake Leach  PA-C Department of neurosurgery

## 2023-04-18 ENCOUNTER — Encounter: Payer: Medicare Other | Admitting: Orthopedic Surgery

## 2023-04-18 DIAGNOSIS — M48062 Spinal stenosis, lumbar region with neurogenic claudication: Secondary | ICD-10-CM

## 2023-04-18 DIAGNOSIS — Z9889 Other specified postprocedural states: Secondary | ICD-10-CM

## 2023-04-19 DIAGNOSIS — K080 Exfoliation of teeth due to systemic causes: Secondary | ICD-10-CM | POA: Diagnosis not present

## 2023-04-25 DIAGNOSIS — K080 Exfoliation of teeth due to systemic causes: Secondary | ICD-10-CM | POA: Diagnosis not present

## 2023-06-01 ENCOUNTER — Other Ambulatory Visit: Payer: Self-pay | Admitting: Physician Assistant

## 2023-06-01 DIAGNOSIS — I10 Essential (primary) hypertension: Secondary | ICD-10-CM

## 2023-06-03 ENCOUNTER — Other Ambulatory Visit: Payer: Self-pay | Admitting: Physician Assistant

## 2023-06-03 DIAGNOSIS — I10 Essential (primary) hypertension: Secondary | ICD-10-CM

## 2023-06-16 ENCOUNTER — Ambulatory Visit (INDEPENDENT_AMBULATORY_CARE_PROVIDER_SITE_OTHER): Payer: Medicare Other | Admitting: Physician Assistant

## 2023-06-16 ENCOUNTER — Encounter: Payer: Self-pay | Admitting: Physician Assistant

## 2023-06-16 VITALS — BP 140/90 | HR 89 | Temp 98.2°F | Resp 16 | Ht 74.0 in | Wt 288.0 lb

## 2023-06-16 DIAGNOSIS — I10 Essential (primary) hypertension: Secondary | ICD-10-CM | POA: Diagnosis not present

## 2023-06-16 DIAGNOSIS — E669 Obesity, unspecified: Secondary | ICD-10-CM

## 2023-06-16 DIAGNOSIS — Z0000 Encounter for general adult medical examination without abnormal findings: Secondary | ICD-10-CM

## 2023-06-16 DIAGNOSIS — E119 Type 2 diabetes mellitus without complications: Secondary | ICD-10-CM | POA: Diagnosis not present

## 2023-06-16 LAB — POCT GLYCOSYLATED HEMOGLOBIN (HGB A1C): Hemoglobin A1C: 6.1 % — AB (ref 4.0–5.6)

## 2023-06-16 MED ORDER — RYBELSUS 7 MG PO TABS: 7.0000 mg | ORAL_TABLET | Freq: Every day | ORAL | 2 refills | Status: DC

## 2023-06-16 NOTE — Progress Notes (Signed)
 [facility] [address] [place], [place] [address]  Internal MEDICINE  Office Visit Note  Patient Name: [name]  [id number]  [id number]  Date of Service: 06/16/2023  Chief Complaint  Patient presents with   Medicare Wellness   Depression   Hypertension    HPI Oneil presents for an annual well visit Well-appearing 68 y.o. male Routine CRC screening: UTD in 2023 Eye exam and/or foot exam: eye exam done Oct 2024, foot exam done today--trimming nails Labs: lab slip given -back pain resolved since surgery -lingering cough since covid in August, a little more SOB with stairs but knows wt gain is impacting this. No wheezing or SOB at rest. May need to consider further work up if not improving -BP at home: 130/70 now, greatly improved. A little elevated in office still today -will plan to start on rybelsus --pt will pick up sample next week and will send order for higher dose for prior auth     06/16/2023   11:23 AM  MMSE - Mini Mental State Exam  Orientation to time 5  Orientation to Place 5  Registration 3  Attention/ Calculation 5  Recall 3  Language- name 2 objects 2  Language- repeat 1  Language- follow 3 step command 3  Language- read & follow direction 1  Write a sentence 1  Copy design 1  Total score 30    Functional Status Survey: Is the patient deaf or have difficulty hearing?: No Does the patient have difficulty seeing, even when wearing glasses/contacts?: No Does the patient have difficulty concentrating, remembering, or making decisions?: No Does the patient have difficulty walking or climbing stairs?: No Does the patient have difficulty dressing or bathing?: No Does the patient have difficulty doing errands alone such as visiting a doctor's office or shopping?: No     12/09/2021   12:00 AM 12/09/2021    8:48 AM 06/10/2022   11:05 AM 12/30/2022   11:35 AM 06/16/2023   11:22 AM  Fall Risk  Falls in the past year?   0 0 0  (RETIRED)  Patient Fall Risk Level High fall risk High fall risk          08/26/2021   10:43 AM  Depression screen PHQ 2/9  Decreased Interest 0  Down, Depressed, Hopeless 0  PHQ - 2 Score 0        No data to display            Current Medication: Outpatient Encounter Medications as of 06/16/2023  Medication Sig   ACCU-CHEK GUIDE test strip USE AS DIRECTED DAILY   amLODipine -benazepril  (LOTREL) 10-40 MG capsule TAKE 1 CAPSULE BY MOUTH EVERY DAY   buPROPion  (WELLBUTRIN  XL) 150 MG 24 hr tablet TAKE 1 TABLET BY MOUTH EVERY DAY   hydrALAZINE  (APRESOLINE ) 10 MG tablet TAKE 1 TABLET (10 MG TOTAL) BY MOUTH 3 (THREE) TIMES DAILY. AS NEEDED FOR HIGH BP.   hydrochlorothiazide  (HYDRODIURIL ) 25 MG tablet TAKE 1 TABLET (25 MG TOTAL) BY MOUTH DAILY.   omeprazole  (PRILOSEC  OTC) 20 MG tablet Take 20 mg by mouth daily as needed (heartburn).   Semaglutide  (RYBELSUS ) 7 MG TABS Take 1 tablet (7 mg total) by mouth daily.   No facility-administered encounter medications on file as of 06/16/2023.    Surgical History: Past Surgical History:  Procedure Laterality Date   COLONOSCOPY WITH PROPOFOL  N/A 07/01/2021   Procedure: COLONOSCOPY WITH PROPOFOL ;  Surgeon: Jinny Carmine, MD;  Location: [facility] ENDOSCOPY;  Service: Endoscopy;  Laterality: N/A;  KNEE ARTHROPLASTY Left 12/08/2021   Procedure: COMPUTER ASSISTED TOTAL KNEE ARTHROPLASTY;  Surgeon: Mardee Lynwood SQUIBB, MD;  Location: [facility] ORS;  Service: Orthopedics;  Laterality: Left;   LUMBAR LAMINECTOMY/DECOMPRESSION MICRODISCECTOMY N/A 01/27/2023   Procedure: L2-5 POSTERIOR SPINAL DECOMPRESSION;  Surgeon: Clois Fret, MD;  Location: [facility] ORS;  Service: Neurosurgery;  Laterality: N/A;   PROSTATECTOMY  2007   REPLACEMENT TOTAL KNEE Right 02/06/2013   TONSILLECTOMY Bilateral     Medical History: Past Medical History:  Diagnosis Date   Cancer (HCC)    prostate and skin   Degenerative arthritis of left knee    Depression    Essential hypertension    GERD  (gastroesophageal reflux disease)    Low testosterone    Obesity (BMI 30.0-34.9)    Obstructive sleep apnea on CPAP    Pre-diabetes     Family History: Family History  Problem Relation Age of Onset   ALS Mother    Cancer Father    Cancer Paternal Grandfather     Social History   Socioeconomic History   Marital status: Married    Spouse name: Candis   Number of children: 1   Years of education: Not on file   Highest education level: Not on file  Occupational History   Not on file  Tobacco Use   Smoking status: Former    Types: Cigarettes   Smokeless tobacco: Former    Types: Engineer, Drilling   Vaping status: Never Used  Substance and Sexual Activity   Alcohol use: Yes    Comment: social   Drug use: No   Sexual activity: Not on file  Other Topics Concern   Not on file  Social History Narrative   Not on file   Social Drivers of Health   Financial Resource Strain: Not on file  Food Insecurity: No Food Insecurity (01/27/2023)   Hunger Vital Sign    Worried About Running Out of Food in the Last Year: Never true    Ran Out of Food in the Last Year: Never true  Transportation Needs: No Transportation Needs (01/27/2023)   PRAPARE - Administrator, Civil Service (Medical): No    Lack of Transportation (Non-Medical): No  Physical Activity: Not on file  Stress: Not on file  Social Connections: Not on file  Intimate Partner Violence: Not At Risk (01/27/2023)   Humiliation, Afraid, Rape, and Kick questionnaire    Fear of Current or Ex-Partner: No    Emotionally Abused: No    Physically Abused: No    Sexually Abused: No      Review of Systems  Constitutional:  Negative for chills, fatigue and unexpected weight change.  HENT:  Positive for postnasal drip. Negative for congestion, rhinorrhea, sneezing and sore throat.   Eyes:  Negative for redness.  Respiratory:  Positive for cough. Negative for chest tightness, shortness of breath and wheezing.    Cardiovascular:  Negative for chest pain and palpitations.  Gastrointestinal:  Negative for abdominal pain, constipation, diarrhea, nausea and vomiting.  Genitourinary:  Negative for dysuria and frequency.  Musculoskeletal:  Negative for arthralgias, back pain, joint swelling and neck pain.  Skin:  Negative for rash.  Neurological: Negative.  Negative for tremors and numbness.  Hematological:  Negative for adenopathy. Does not bruise/bleed easily.  Psychiatric/Behavioral:  Negative for behavioral problems (Depression), sleep disturbance and suicidal ideas. The patient is not nervous/anxious.     Vital Signs: BP (!) 140/90 Comment: 140/100  Pulse 89  Temp 98.2 F (36.8 C)   Resp 16   Ht 6' 2 (1.88 m)   Wt 288 lb (130.6 kg)   SpO2 98%   BMI 36.98 kg/m    Physical Exam Vitals and nursing note reviewed.  Constitutional:      Appearance: Normal appearance.  HENT:     Head: Normocephalic and atraumatic.  Cardiovascular:     Rate and Rhythm: Normal rate and regular rhythm.  Pulmonary:     Effort: Pulmonary effort is normal.     Breath sounds: Normal breath sounds.  Feet:     Right foot:     Protective Sensation: 4 sites tested.  4 sites sensed.     Skin integrity: Skin integrity normal.     Toenail Condition: Right toenails are long.     Left foot:     Protective Sensation: 4 sites tested.  4 sites sensed.     Skin integrity: Skin integrity normal.     Toenail Condition: Left toenails are long.  Neurological:     Mental Status: He is alert and oriented to person, place, and time.  Psychiatric:        Mood and Affect: Mood normal.        Behavior: Behavior normal.        Assessment/Plan: 1. Encounter for Medicare annual wellness exam (Primary) AWV performed, lab slip given, UTD on PHM other than PNA vaccine  2. Type 2 diabetes mellitus without complication, without long-term current use of insulin  (HCC) - POCT HgB A1C is 6.1 which is up from 6.0 last visit. Will  pick up rybelsus  3mg  sample next week and plan to start on 7mg  after 4 weeks. - Semaglutide  (RYBELSUS ) 7 MG TABS; Take 1 tablet (7 mg total) by mouth daily.  Dispense: 30 tablet; Refill: 2  3. Essential hypertension Improved at home, but elevated in office. Will continue to monitor  4. Obesity (BMI 30-39.9) Working on diet and exercise and plan to start rybelsus  to help BG and wt    General Counseling: wilder kurowski understanding of the findings of todays visit and agrees with plan of treatment. I have discussed any further diagnostic evaluation that may be needed or ordered today. We also reviewed his medications today. he has been encouraged to call the office with any questions or concerns that should arise related to todays visit.    Orders Placed This Encounter  Procedures   POCT HgB A1C    Meds ordered this encounter  Medications   Semaglutide  (RYBELSUS ) 7 MG TABS    Sig: Take 1 tablet (7 mg total) by mouth daily.    Dispense:  30 tablet    Refill:  2    Return in about 4 weeks (around 07/14/2023) for lab review.   Total time spent:35 Minutes Time spent includes review of chart, medications, test results, and follow up plan with the patient.   [place] Controlled Substance Database was reviewed by me.  This patient was seen by Tinnie Pro, PA-C in collaboration with Dr. Sigrid Bathe as a part of collaborative care agreement.  Tinnie Pro, PA-C Internal medicine

## 2023-06-18 ENCOUNTER — Other Ambulatory Visit: Payer: Self-pay | Admitting: Physician Assistant

## 2023-06-18 DIAGNOSIS — I10 Essential (primary) hypertension: Secondary | ICD-10-CM

## 2023-06-21 ENCOUNTER — Telehealth: Payer: Self-pay

## 2023-06-21 NOTE — Telephone Encounter (Signed)
 Approved for Rybelsus  7mg .

## 2023-07-03 ENCOUNTER — Other Ambulatory Visit: Payer: Self-pay | Admitting: Physician Assistant

## 2023-07-03 DIAGNOSIS — E038 Other specified hypothyroidism: Secondary | ICD-10-CM | POA: Diagnosis not present

## 2023-07-03 DIAGNOSIS — E559 Vitamin D deficiency, unspecified: Secondary | ICD-10-CM | POA: Diagnosis not present

## 2023-07-03 DIAGNOSIS — Z125 Encounter for screening for malignant neoplasm of prostate: Secondary | ICD-10-CM | POA: Diagnosis not present

## 2023-07-03 DIAGNOSIS — E119 Type 2 diabetes mellitus without complications: Secondary | ICD-10-CM | POA: Diagnosis not present

## 2023-07-03 DIAGNOSIS — R5383 Other fatigue: Secondary | ICD-10-CM | POA: Diagnosis not present

## 2023-07-03 DIAGNOSIS — E782 Mixed hyperlipidemia: Secondary | ICD-10-CM | POA: Diagnosis not present

## 2023-07-03 DIAGNOSIS — Z0001 Encounter for general adult medical examination with abnormal findings: Secondary | ICD-10-CM | POA: Diagnosis not present

## 2023-07-04 LAB — CBC WITH DIFFERENTIAL/PLATELET
Basophils Absolute: 0.1 10*3/uL (ref 0.0–0.2)
Basos: 1 %
EOS (ABSOLUTE): 0.3 10*3/uL (ref 0.0–0.4)
Eos: 3 %
Hematocrit: 50.9 % (ref 37.5–51.0)
Hemoglobin: 17.2 g/dL (ref 13.0–17.7)
Immature Grans (Abs): 0 10*3/uL (ref 0.0–0.1)
Immature Granulocytes: 0 %
Lymphocytes Absolute: 2.2 10*3/uL (ref 0.7–3.1)
Lymphs: 26 %
MCH: 28.8 pg (ref 26.6–33.0)
MCHC: 33.8 g/dL (ref 31.5–35.7)
MCV: 85 fL (ref 79–97)
Monocytes Absolute: 0.6 10*3/uL (ref 0.1–0.9)
Monocytes: 7 %
Neutrophils Absolute: 5.3 10*3/uL (ref 1.4–7.0)
Neutrophils: 63 %
Platelets: 368 10*3/uL (ref 150–450)
RBC: 5.97 x10E6/uL — ABNORMAL HIGH (ref 4.14–5.80)
RDW: 13.3 % (ref 11.6–15.4)
WBC: 8.5 10*3/uL (ref 3.4–10.8)

## 2023-07-04 LAB — PSA, TOTAL AND FREE
PSA, Free: <0.02 ng/mL
Prostate Specific Ag, Serum: <0.1 ng/mL (ref 0.0–4.0)

## 2023-07-04 LAB — COMPREHENSIVE METABOLIC PANEL
ALT: 24 [IU]/L (ref 0–44)
AST: 21 [IU]/L (ref 0–40)
Albumin: 4.6 g/dL (ref 3.9–4.9)
Alkaline Phosphatase: 87 [IU]/L (ref 44–121)
BUN/Creatinine Ratio: 10 (ref 10–24)
BUN: 19 mg/dL (ref 8–27)
Bilirubin Total: 0.5 mg/dL (ref 0.0–1.2)
CO2: 23 mmol/L (ref 20–29)
Calcium: 9.9 mg/dL (ref 8.6–10.2)
Chloride: 101 mmol/L (ref 96–106)
Creatinine, Ser: 1.91 mg/dL — ABNORMAL HIGH (ref 0.76–1.27)
Globulin, Total: 2.2 g/dL (ref 1.5–4.5)
Glucose: 117 mg/dL — ABNORMAL HIGH (ref 70–99)
Potassium: 4.5 mmol/L (ref 3.5–5.2)
Sodium: 140 mmol/L (ref 134–144)
Total Protein: 6.8 g/dL (ref 6.0–8.5)
eGFR: 38 mL/min/{1.73_m2} — ABNORMAL LOW (ref >59)

## 2023-07-04 LAB — LIPID PANEL WITH LDL/HDL RATIO
Cholesterol, Total: 167 mg/dL (ref 100–199)
HDL: 36 mg/dL — ABNORMAL LOW (ref >39)
LDL Chol Calc (NIH): 108 mg/dL — ABNORMAL HIGH (ref 0–99)
LDL/HDL Ratio: 3 {ratio} (ref 0.0–3.6)
Triglycerides: 127 mg/dL (ref 0–149)
VLDL Cholesterol Cal: 23 mg/dL (ref 5–40)

## 2023-07-04 LAB — TSH: TSH: 3.58 u[IU]/mL (ref 0.450–4.500)

## 2023-07-04 LAB — T4, FREE: Free T4: 1.21 ng/dL (ref 0.82–1.77)

## 2023-07-17 ENCOUNTER — Encounter: Payer: Self-pay | Admitting: Physician Assistant

## 2023-07-17 ENCOUNTER — Ambulatory Visit (INDEPENDENT_AMBULATORY_CARE_PROVIDER_SITE_OTHER): Payer: Medicare Other | Admitting: Physician Assistant

## 2023-07-17 VITALS — BP 155/85 | HR 92 | Temp 98.5°F | Resp 16 | Ht 74.0 in | Wt 280.0 lb

## 2023-07-17 DIAGNOSIS — N289 Disorder of kidney and ureter, unspecified: Secondary | ICD-10-CM | POA: Diagnosis not present

## 2023-07-17 DIAGNOSIS — I10 Essential (primary) hypertension: Secondary | ICD-10-CM | POA: Diagnosis not present

## 2023-07-17 DIAGNOSIS — E119 Type 2 diabetes mellitus without complications: Secondary | ICD-10-CM

## 2023-07-17 DIAGNOSIS — E538 Deficiency of other specified B group vitamins: Secondary | ICD-10-CM | POA: Diagnosis not present

## 2023-07-17 DIAGNOSIS — E669 Obesity, unspecified: Secondary | ICD-10-CM

## 2023-07-17 DIAGNOSIS — E559 Vitamin D deficiency, unspecified: Secondary | ICD-10-CM

## 2023-07-17 NOTE — Progress Notes (Signed)
 [facility] [address] [place], [place] [address]  Internal MEDICINE  Office Visit Note  Patient Name: [name]  [id number]  [id number]  Date of Service: 07/26/2023  Chief Complaint  Patient presents with   Follow-up    Review labs   Depression   Gastroesophageal Reflux   Hypertension    HPI Pt is here for routine follow up -doing well with rybelsus, down 9lbs since last visit, about to start 7mg  dose -having reflux, will take tums and will get constipation. May start increasing to omeprazole daily instead of prn -taking lotrel at night -walking about 1 mile per day -labs reviewed: renal function abnormal--discussed renal US/referral, but pt would prefer to recheck labs again fist, LDL a little elevated -is taking some NSAIDs and will avoid this due to renal function. Also discussed may need to adjust bp meds for renal function as well to reduce hydrochlorothiazide and maybe increase hydralazine if needed -BP at home: 121/73 this morning, bring cuff next visit to compare since elevated in office. 150/100 on recheck in office  Current Medication: Outpatient Encounter Medications as of 07/17/2023  Medication Sig   ACCU-CHEK GUIDE test strip USE AS DIRECTED DAILY   amLODipine-benazepril (LOTREL) 10-40 MG capsule TAKE 1 CAPSULE BY MOUTH EVERY DAY   buPROPion (WELLBUTRIN XL) 150 MG 24 hr tablet TAKE 1 TABLET BY MOUTH EVERY DAY   hydrALAZINE (APRESOLINE) 10 MG tablet TAKE 1 TABLET (10 MG TOTAL) BY MOUTH 3 (THREE) TIMES DAILY. AS NEEDED FOR HIGH BP.   hydrochlorothiazide (HYDRODIURIL) 25 MG tablet TAKE 1 TABLET (25 MG TOTAL) BY MOUTH DAILY.   omeprazole (PRILOSEC OTC) 20 MG tablet Take 20 mg by mouth daily as needed (heartburn).   Semaglutide (RYBELSUS) 7 MG TABS Take 1 tablet (7 mg total) by mouth daily.   No facility-administered encounter medications on file as of 07/17/2023.    Surgical History: Past Surgical History:  Procedure Laterality Date    COLONOSCOPY WITH PROPOFOL N/A 07/01/2021   Procedure: COLONOSCOPY WITH PROPOFOL;  Surgeon: Midge Minium, MD;  Location: [facility] ENDOSCOPY;  Service: Endoscopy;  Laterality: N/A;   KNEE ARTHROPLASTY Left 12/08/2021   Procedure: COMPUTER ASSISTED TOTAL KNEE ARTHROPLASTY;  Surgeon: Donato Heinz, MD;  Location: [facility] ORS;  Service: Orthopedics;  Laterality: Left;   LUMBAR LAMINECTOMY/DECOMPRESSION MICRODISCECTOMY N/A 01/27/2023   Procedure: L2-5 POSTERIOR SPINAL DECOMPRESSION;  Surgeon: Venetia Night, MD;  Location: [facility] ORS;  Service: Neurosurgery;  Laterality: N/A;   PROSTATECTOMY  2007   REPLACEMENT TOTAL KNEE Right 02/06/2013   TONSILLECTOMY Bilateral     Medical History: Past Medical History:  Diagnosis Date   Cancer (HCC)    prostate and skin   Degenerative arthritis of left knee    Depression    Essential hypertension    GERD (gastroesophageal reflux disease)    Low testosterone    Obesity (BMI 30.0-34.9)    Obstructive sleep apnea on CPAP    Pre-diabetes     Family History: Family History  Problem Relation Age of Onset   ALS Mother    Cancer Father    Cancer Paternal Grandfather     Social History   Socioeconomic History   Marital status: Married    Spouse name: Aurea Graff   Number of children: 1   Years of education: Not on file   Highest education level: Not on file  Occupational History   Not on file  Tobacco Use   Smoking status: Former    Types: Cigarettes  Smokeless tobacco: Former    Types: Engineer, drilling   Vaping status: Never Used  Substance and Sexual Activity   Alcohol use: Yes    Comment: social   Drug use: No   Sexual activity: Not on file  Other Topics Concern   Not on file  Social History Narrative   Not on file   Social Drivers of Health   Financial Resource Strain: Not on file  Food Insecurity: No Food Insecurity (01/27/2023)   Hunger Vital Sign    Worried About Running Out of Food in the Last Year: Never true    Ran Out of Food in  the Last Year: Never true  Transportation Needs: No Transportation Needs (01/27/2023)   PRAPARE - Administrator, Civil Service (Medical): No    Lack of Transportation (Non-Medical): No  Physical Activity: Not on file  Stress: Not on file  Social Connections: Not on file  Intimate Partner Violence: Not At Risk (01/27/2023)   Humiliation, Afraid, Rape, and Kick questionnaire    Fear of Current or Ex-Partner: No    Emotionally Abused: No    Physically Abused: No    Sexually Abused: No      Review of Systems  Constitutional:  Negative for chills, fatigue and unexpected weight change.  HENT:  Negative for congestion, rhinorrhea, sneezing and sore throat.   Eyes:  Negative for redness.  Respiratory:  Negative for chest tightness, shortness of breath and wheezing.   Cardiovascular:  Negative for chest pain and palpitations.  Gastrointestinal:  Negative for abdominal pain, constipation, diarrhea, nausea and vomiting.  Genitourinary:  Negative for dysuria and frequency.  Musculoskeletal:  Negative for arthralgias, back pain, joint swelling and neck pain.  Skin:  Negative for rash.  Neurological: Negative.  Negative for tremors and numbness.  Hematological:  Negative for adenopathy. Does not bruise/bleed easily.  Psychiatric/Behavioral:  Negative for behavioral problems (Depression), sleep disturbance and suicidal ideas. The patient is not nervous/anxious.     Vital Signs: BP (!) 155/85   Pulse 92   Temp 98.5 F (36.9 C)   Resp 16   Ht 6\' 2"  (1.88 m)   Wt 280 lb (127 kg)   SpO2 99%   BMI 35.95 kg/m    Physical Exam Vitals and nursing note reviewed.  Constitutional:      General: He is not in acute distress.    Appearance: He is well-developed. He is not diaphoretic.  HENT:     Head: Normocephalic and atraumatic.  Eyes:     Pupils: Pupils are equal, round, and reactive to light.  Neck:     Thyroid: No thyromegaly.     Vascular: No JVD.     Trachea: No tracheal  deviation.  Cardiovascular:     Rate and Rhythm: Normal rate and regular rhythm.     Heart sounds: Normal heart sounds. No murmur heard.    No friction rub. No gallop.  Pulmonary:     Effort: Pulmonary effort is normal.     Breath sounds: No wheezing.  Musculoskeletal:        General: Normal range of motion.  Skin:    General: Skin is warm and dry.  Neurological:     Mental Status: He is alert and oriented to person, place, and time.  Psychiatric:        Behavior: Behavior normal.        Thought Content: Thought content normal.        Judgment:  Judgment normal.        Assessment/Plan: 1. Essential hypertension (Primary) Elevated in office however has been well-controlled at home.  Advised to bring cuff to next visit to ensure accurate readings at home.  May need further adjustment of blood pressure medications pending renal function and continued monitoring  2. Type 2 diabetes mellitus without complication, without long-term current use of insulin (HCC) Continue Rybelsus and working on diet and exercise  3. Abnormal renal function Found to be initially abnormal by outside provider, with worsening on recent labs. May be multifactorial from diabetes and hypertension as well as medications.  Discussed need for renal ultrasound and/or nephrology referral, but pt prefers to recheck labs again now that BG and BP have been improving at home. May also need adjustment of medications. Will stop NSAIDs - Basic Metabolic Panel (BMET)  4. B12 deficiency - B12 and Folate Panel  5. Vitamin D deficiency - VITAMIN D 25 Hydroxy (Vit-D Deficiency, Fractures)  6. Obesity (BMI 30-39.9) Down 8lbs since last visit, will continue Rybelsus as well as working on diet and exercise   General Counseling: nithin demeo understanding of the findings of todays visit and agrees with plan of treatment. I have discussed any further diagnostic evaluation that may be needed or ordered today. We also  reviewed his medications today. he has been encouraged to call the office with any questions or concerns that should arise related to todays visit.    Orders Placed This Encounter  Procedures   Basic Metabolic Panel (BMET)   B12 and Folate Panel   VITAMIN D 25 Hydroxy (Vit-D Deficiency, Fractures)    No orders of the defined types were placed in this encounter.   This patient was seen by Lynn Ito, PA-C in collaboration with Dr. Beverely Risen as a part of collaborative care agreement.   Total time spent:30 Minutes Time spent includes review of chart, medications, test results, and follow up plan with the patient.      Dr Lyndon Code Internal medicine

## 2023-07-20 DIAGNOSIS — K080 Exfoliation of teeth due to systemic causes: Secondary | ICD-10-CM | POA: Diagnosis not present

## 2023-08-17 ENCOUNTER — Ambulatory Visit: Payer: Medicare Other | Admitting: Physician Assistant

## 2023-08-24 ENCOUNTER — Telehealth: Payer: Self-pay | Admitting: Physician Assistant

## 2023-08-24 NOTE — Telephone Encounter (Signed)
 Received medication recommendation from [facility].Gave to [employer]

## 2023-09-06 DIAGNOSIS — E559 Vitamin D deficiency, unspecified: Secondary | ICD-10-CM | POA: Diagnosis not present

## 2023-09-06 DIAGNOSIS — E538 Deficiency of other specified B group vitamins: Secondary | ICD-10-CM | POA: Diagnosis not present

## 2023-09-06 DIAGNOSIS — N289 Disorder of kidney and ureter, unspecified: Secondary | ICD-10-CM | POA: Diagnosis not present

## 2023-09-07 LAB — BASIC METABOLIC PANEL WITH GFR
BUN/Creatinine Ratio: 10 (ref 10–24)
BUN: 16 mg/dL (ref 8–27)
CO2: 22 mmol/L (ref 20–29)
Calcium: 9.3 mg/dL (ref 8.6–10.2)
Chloride: 104 mmol/L (ref 96–106)
Creatinine, Ser: 1.54 mg/dL — ABNORMAL HIGH (ref 0.76–1.27)
Glucose: 98 mg/dL (ref 70–99)
Potassium: 4.6 mmol/L (ref 3.5–5.2)
Sodium: 140 mmol/L (ref 134–144)
eGFR: 49 mL/min/{1.73_m2} — ABNORMAL LOW (ref >59)

## 2023-09-07 LAB — B12 AND FOLATE PANEL
Folate: 7.1 ng/mL (ref >3.0)
Vitamin B-12: 454 pg/mL (ref 232–1245)

## 2023-09-07 LAB — VITAMIN D 25 HYDROXY (VIT D DEFICIENCY, FRACTURES): Vit D, 25-Hydroxy: 20.3 ng/mL — ABNORMAL LOW (ref 30.0–100.0)

## 2023-09-14 ENCOUNTER — Ambulatory Visit (INDEPENDENT_AMBULATORY_CARE_PROVIDER_SITE_OTHER): Admitting: Physician Assistant

## 2023-09-14 ENCOUNTER — Encounter: Payer: Self-pay | Admitting: Physician Assistant

## 2023-09-14 VITALS — BP 128/90 | HR 96 | Temp 98.0°F | Resp 16 | Ht 74.0 in | Wt 268.0 lb

## 2023-09-14 DIAGNOSIS — E559 Vitamin D deficiency, unspecified: Secondary | ICD-10-CM

## 2023-09-14 DIAGNOSIS — E119 Type 2 diabetes mellitus without complications: Secondary | ICD-10-CM | POA: Diagnosis not present

## 2023-09-14 DIAGNOSIS — E669 Obesity, unspecified: Secondary | ICD-10-CM

## 2023-09-14 DIAGNOSIS — N289 Disorder of kidney and ureter, unspecified: Secondary | ICD-10-CM

## 2023-09-14 DIAGNOSIS — I10 Essential (primary) hypertension: Secondary | ICD-10-CM | POA: Diagnosis not present

## 2023-09-14 LAB — POCT GLYCOSYLATED HEMOGLOBIN (HGB A1C): Hemoglobin A1C: 5.4 % (ref 4.0–5.6)

## 2023-09-14 NOTE — Progress Notes (Signed)
 [facility] [address] [place], [place] [address]  Internal MEDICINE  Office Visit Note  Patient Name: [name]  [id number]  [id number]  Date of Service: 09/14/2023  Chief Complaint  Patient presents with   Follow-up   Hypertension   Gastroesophageal Reflux   Depression   Diabetes    HPI Pt is here for routine follow up -Renal function improving, but still low. Will check renal US. Avoiding NSAIDs. Does state water intake could be better. -losing weight, doing well with rybelsus. Down 12 lbs since last visit -Vit D low--will supplement -taking lotrel at Night, has not been taking hydrochlorothiazide since renal function abnormal and swelling resolved. Also stopped hydralazine as he wasn't sure which medication was the fluid pill. He does have this at home and will take this if BP elevated. Borderline in office. Controlled at home -138/90 on recheck  Current Medication: Outpatient Encounter Medications as of 09/14/2023  Medication Sig   ACCU-CHEK GUIDE test strip USE AS DIRECTED DAILY   amLODipine-benazepril (LOTREL) 10-40 MG capsule TAKE 1 CAPSULE BY MOUTH EVERY DAY   buPROPion (WELLBUTRIN XL) 150 MG 24 hr tablet TAKE 1 TABLET BY MOUTH EVERY DAY   hydrALAZINE (APRESOLINE) 10 MG tablet TAKE 1 TABLET (10 MG TOTAL) BY MOUTH 3 (THREE) TIMES DAILY. AS NEEDED FOR HIGH BP.   omeprazole (PRILOSEC OTC) 20 MG tablet Take 20 mg by mouth daily as needed (heartburn).   Semaglutide (RYBELSUS) 7 MG TABS Take 1 tablet (7 mg total) by mouth daily.   [DISCONTINUED] hydrochlorothiazide (HYDRODIURIL) 25 MG tablet TAKE 1 TABLET (25 MG TOTAL) BY MOUTH DAILY.   No facility-administered encounter medications on file as of 09/14/2023.    Surgical History: Past Surgical History:  Procedure Laterality Date   COLONOSCOPY WITH PROPOFOL N/A 07/01/2021   Procedure: COLONOSCOPY WITH PROPOFOL;  Surgeon: Midge Minium, MD;  Location: [facility] ENDOSCOPY;  Service: Endoscopy;  Laterality:  N/A;   KNEE ARTHROPLASTY Left 12/08/2021   Procedure: COMPUTER ASSISTED TOTAL KNEE ARTHROPLASTY;  Surgeon: Donato Heinz, MD;  Location: [facility] ORS;  Service: Orthopedics;  Laterality: Left;   LUMBAR LAMINECTOMY/DECOMPRESSION MICRODISCECTOMY N/A 01/27/2023   Procedure: L2-5 POSTERIOR SPINAL DECOMPRESSION;  Surgeon: Venetia Night, MD;  Location: [facility] ORS;  Service: Neurosurgery;  Laterality: N/A;   PROSTATECTOMY  2007   REPLACEMENT TOTAL KNEE Right 02/06/2013   TONSILLECTOMY Bilateral     Medical History: Past Medical History:  Diagnosis Date   Cancer (HCC)    prostate and skin   Degenerative arthritis of left knee    Depression    Essential hypertension    GERD (gastroesophageal reflux disease)    Low testosterone    Obesity (BMI 30.0-34.9)    Obstructive sleep apnea on CPAP    Pre-diabetes     Family History: Family History  Problem Relation Age of Onset   ALS Mother    Cancer Father    Cancer Paternal Grandfather     Social History   Socioeconomic History   Marital status: Married    Spouse name: Aurea Graff   Number of children: 1   Years of education: Not on file   Highest education level: Not on file  Occupational History   Not on file  Tobacco Use   Smoking status: Former    Types: Cigarettes   Smokeless tobacco: Former    Types: Engineer, drilling   Vaping status: Never Used  Substance and Sexual Activity   Alcohol use: Yes    Comment:  social   Drug use: No   Sexual activity: Not on file  Other Topics Concern   Not on file  Social History Narrative   Not on file   Social Drivers of Health   Financial Resource Strain: Not on file  Food Insecurity: No Food Insecurity (01/27/2023)   Hunger Vital Sign    Worried About Running Out of Food in the Last Year: Never true    Ran Out of Food in the Last Year: Never true  Transportation Needs: No Transportation Needs (01/27/2023)   PRAPARE - Administrator, Civil Service (Medical): No    Lack of  Transportation (Non-Medical): No  Physical Activity: Not on file  Stress: Not on file  Social Connections: Not on file  Intimate Partner Violence: Not At Risk (01/27/2023)   Humiliation, Afraid, Rape, and Kick questionnaire    Fear of Current or Ex-Partner: No    Emotionally Abused: No    Physically Abused: No    Sexually Abused: No      Review of Systems  Constitutional:  Negative for chills, fatigue and unexpected weight change.  HENT:  Negative for congestion, rhinorrhea, sneezing and sore throat.   Eyes:  Negative for redness.  Respiratory:  Negative for chest tightness, shortness of breath and wheezing.   Cardiovascular:  Negative for chest pain and palpitations.  Gastrointestinal:  Negative for abdominal pain, constipation, diarrhea, nausea and vomiting.  Genitourinary:  Negative for dysuria and frequency.  Musculoskeletal:  Negative for arthralgias, back pain, joint swelling and neck pain.  Skin:  Negative for rash.  Neurological: Negative.  Negative for tremors and numbness.  Hematological:  Negative for adenopathy. Does not bruise/bleed easily.  Psychiatric/Behavioral:  Negative for behavioral problems (Depression), sleep disturbance and suicidal ideas. The patient is not nervous/anxious.     Vital Signs: BP (!) 128/90   Pulse 96   Temp 98 F (36.7 C)   Resp 16   Ht 6\' 2"  (1.88 m)   Wt 268 lb (121.6 kg)   SpO2 97%   BMI 34.41 kg/m    Physical Exam Vitals and nursing note reviewed.  Constitutional:      General: He is not in acute distress.    Appearance: He is well-developed. He is not diaphoretic.  HENT:     Head: Normocephalic and atraumatic.  Eyes:     Pupils: Pupils are equal, round, and reactive to light.  Neck:     Thyroid: No thyromegaly.     Vascular: No JVD.     Trachea: No tracheal deviation.  Cardiovascular:     Rate and Rhythm: Normal rate and regular rhythm.     Heart sounds: Normal heart sounds. No murmur heard.    No friction rub. No  gallop.  Pulmonary:     Effort: Pulmonary effort is normal.     Breath sounds: No wheezing.  Musculoskeletal:        General: Normal range of motion.  Skin:    General: Skin is warm and dry.  Neurological:     Mental Status: He is alert and oriented to person, place, and time.  Psychiatric:        Behavior: Behavior normal.        Thought Content: Thought content normal.        Judgment: Judgment normal.        Assessment/Plan: 1. Type 2 diabetes mellitus without complication, without long-term current use of insulin (HCC) (Primary) - POCT HgB A1C is 5.4  which is improved from 6.1 last check. Continue rybelsus and working on diet and exercise - Urine Microalbumin w/creat. ratio  2. Abnormal renal function Improving, but still abnormal. Will check [place] and recheck labs as BG and BP continue to improve - US Renal; Future - Basic Metabolic Panel (BMET)  3. Essential hypertension Improving, borderline in office but well controlled at home.  4. Vitamin D deficiency Start OTC vit D supplement  5. Obesity (BMI 30-39.9) Down 12lbs since last visit, continue to work on diet and exercise and may continue rybelsus for BG and wt loss benefits   General Counseling: Jaydee verbalizes understanding of the findings of todays visit and agrees with plan of treatment. I have discussed any further diagnostic evaluation that may be needed or ordered today. We also reviewed his medications today. he has been encouraged to call the office with any questions or concerns that should arise related to todays visit.    Orders Placed This Encounter  Procedures   US Renal   Urine Microalbumin w/creat. ratio   Basic Metabolic Panel (BMET)   POCT HgB A1C    No orders of the defined types were placed in this encounter.   This patient was seen by Lynn Ito, PA-C in collaboration with Dr. Beverely Risen as a part of collaborative care agreement.   Total time spent:30 Minutes Time spent  includes review of chart, medications, test results, and follow up plan with the patient.      Dr Lyndon Code Internal medicine

## 2023-09-24 ENCOUNTER — Other Ambulatory Visit: Payer: Self-pay | Admitting: Physician Assistant

## 2023-09-24 DIAGNOSIS — E119 Type 2 diabetes mellitus without complications: Secondary | ICD-10-CM

## 2023-10-11 ENCOUNTER — Telehealth: Payer: Self-pay | Admitting: Physician Assistant

## 2023-10-11 NOTE — Telephone Encounter (Signed)
 Lvm notifying patient of U/S appointment date, arrival time, location & to arrive with full bladder-Toni

## 2023-10-18 ENCOUNTER — Ambulatory Visit
Admission: RE | Admit: 2023-10-18 | Discharge: 2023-10-18 | Disposition: A | Discharge status: Home / Self Care | Source: Ambulatory Visit | Attending: Physician Assistant | Admitting: Physician Assistant

## 2023-10-18 DIAGNOSIS — N281 Cyst of kidney, acquired: Secondary | ICD-10-CM | POA: Diagnosis not present

## 2023-10-18 DIAGNOSIS — N289 Disorder of kidney and ureter, unspecified: Secondary | ICD-10-CM | POA: Insufficient documentation

## 2023-10-18 DIAGNOSIS — N2889 Other specified disorders of kidney and ureter: Secondary | ICD-10-CM | POA: Diagnosis not present

## 2023-10-24 ENCOUNTER — Other Ambulatory Visit: Payer: Self-pay

## 2023-10-24 MED ORDER — BUPROPION HCL ER (XL) 150 MG PO TB24: 150.0000 mg | ORAL_TABLET | Freq: Every day | ORAL | 1 refills | Status: DC

## 2023-10-25 DIAGNOSIS — F3342 Major depressive disorder, recurrent, in full remission: Secondary | ICD-10-CM | POA: Diagnosis not present

## 2023-11-01 LAB — MICROALBUMIN, URINE: Microalb, Ur: <30

## 2023-11-08 ENCOUNTER — Ambulatory Visit: Payer: Self-pay | Admitting: Physician Assistant

## 2023-11-14 ENCOUNTER — Other Ambulatory Visit: Payer: Self-pay

## 2023-12-15 ENCOUNTER — Ambulatory Visit: Admitting: Physician Assistant

## 2023-12-26 ENCOUNTER — Other Ambulatory Visit: Payer: Self-pay | Admitting: Physician Assistant

## 2023-12-26 DIAGNOSIS — E119 Type 2 diabetes mellitus without complications: Secondary | ICD-10-CM

## 2024-01-25 ENCOUNTER — Other Ambulatory Visit: Payer: Self-pay | Admitting: Physician Assistant

## 2024-01-25 DIAGNOSIS — I10 Essential (primary) hypertension: Secondary | ICD-10-CM

## 2024-01-29 DIAGNOSIS — K080 Exfoliation of teeth due to systemic causes: Secondary | ICD-10-CM | POA: Diagnosis not present

## 2024-02-26 DIAGNOSIS — K080 Exfoliation of teeth due to systemic causes: Secondary | ICD-10-CM | POA: Diagnosis not present

## 2024-03-14 ENCOUNTER — Telehealth: Payer: Self-pay | Admitting: Physician Assistant

## 2024-03-14 NOTE — Telephone Encounter (Signed)
Lvm to schedule follow up-Toni

## 2024-04-01 DIAGNOSIS — N289 Disorder of kidney and ureter, unspecified: Secondary | ICD-10-CM | POA: Diagnosis not present

## 2024-04-02 LAB — BASIC METABOLIC PANEL WITH GFR
BUN/Creatinine Ratio: 11 (ref 10–24)
BUN: 16 mg/dL (ref 8–27)
CO2: 22 mmol/L (ref 20–29)
Calcium: 10.4 mg/dL — ABNORMAL HIGH (ref 8.6–10.2)
Chloride: 105 mmol/L (ref 96–106)
Creatinine, Ser: 1.52 mg/dL — ABNORMAL HIGH (ref 0.76–1.27)
Glucose: 115 mg/dL — ABNORMAL HIGH (ref 70–99)
Potassium: 4.8 mmol/L (ref 3.5–5.2)
Sodium: 143 mmol/L (ref 134–144)
eGFR: 50 mL/min/1.73 — ABNORMAL LOW (ref >59)

## 2024-04-04 ENCOUNTER — Encounter: Payer: Self-pay | Admitting: Physician Assistant

## 2024-04-04 ENCOUNTER — Ambulatory Visit (INDEPENDENT_AMBULATORY_CARE_PROVIDER_SITE_OTHER): Admitting: Physician Assistant

## 2024-04-04 ENCOUNTER — Telehealth: Payer: Self-pay | Admitting: Physician Assistant

## 2024-04-04 VITALS — BP 128/76 | HR 89 | Temp 98.0°F | Resp 16 | Ht 74.0 in | Wt 271.0 lb

## 2024-04-04 DIAGNOSIS — N1831 Chronic kidney disease, stage 3a: Secondary | ICD-10-CM | POA: Diagnosis not present

## 2024-04-04 DIAGNOSIS — N2889 Other specified disorders of kidney and ureter: Secondary | ICD-10-CM | POA: Diagnosis not present

## 2024-04-04 DIAGNOSIS — E119 Type 2 diabetes mellitus without complications: Secondary | ICD-10-CM

## 2024-04-04 DIAGNOSIS — I10 Essential (primary) hypertension: Secondary | ICD-10-CM

## 2024-04-04 DIAGNOSIS — E1122 Type 2 diabetes mellitus with diabetic chronic kidney disease: Secondary | ICD-10-CM

## 2024-04-04 LAB — POCT GLYCOSYLATED HEMOGLOBIN (HGB A1C): Hemoglobin A1C: 5.8 % — AB (ref 4.0–5.6)

## 2024-04-04 NOTE — Progress Notes (Signed)
 [facility] [address] 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 and recommends 6 month follow up which is now -BP avg at home 120-130/70-80 -This morning 128/76 -hydralazine  once per day typically at night -142/92 on recheck in office -no ibuprofen now, tylenol  as need -states wife has noticed he doesn't walk as upright and is going to start home exercises -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   hydrALAZINE  (APRESOLINE ) 10 MG tablet TAKE 1 TABLET (10 MG TOTAL) BY MOUTH 3 (THREE) TIMES DAILY. AS NEEDED FOR HIGH BP.   omeprazole  (PRILOSEC  OTC) 20 MG tablet Take 20 mg by mouth daily as needed (heartburn).   [DISCONTINUED] RYBELSUS  7 MG TABS TAKE 1 TABLET (7 MG TOTAL) BY MOUTH DAILY   No facility-administered encounter medications on file as of 04/04/2024.    Surgical History: Past Surgical History:  Procedure Laterality Date   COLONOSCOPY WITH PROPOFOL  N/A 07/01/2021   Procedure: COLONOSCOPY WITH PROPOFOL ;  Surgeon: Jinny Carmine, MD;  Location: [facility] ENDOSCOPY;  Service:  Endoscopy;  Laterality: N/A;   KNEE ARTHROPLASTY Left 12/08/2021   Procedure: COMPUTER ASSISTED TOTAL KNEE ARTHROPLASTY;  Surgeon: Mardee Lynwood SQUIBB, MD;  Location: [facility] ORS;  Service: Orthopedics;  Laterality: Left;   LUMBAR LAMINECTOMY/DECOMPRESSION MICRODISCECTOMY N/A 01/27/2023   Procedure: L2-5 POSTERIOR SPINAL DECOMPRESSION;  Surgeon: Clois Fret, MD;  Location: [facility] ORS;  Service: Neurosurgery;  Laterality: N/A;   PROSTATECTOMY  2007   REPLACEMENT TOTAL KNEE Right 02/06/2013   TONSILLECTOMY Bilateral     Medical History: Past Medical History:  Diagnosis Date   Cancer (HCC)    prostate and skin   Degenerative arthritis of left knee    Depression    Essential hypertension    GERD (gastroesophageal reflux disease)    Low testosterone    Obesity (BMI 30.0-34.9)    Obstructive sleep apnea on CPAP    Pre-diabetes     Family History: Family History  Problem Relation Age of Onset   ALS Mother    Cancer Father    Cancer Paternal Grandfather     Social History   Socioeconomic History   Marital status: Married    Spouse name: Candis   Number of children: 1   Years of education: Not on file   Highest education level: Not on file  Occupational History   Not on file  Tobacco Use   Smoking status: Former    Types: Cigarettes   Smokeless tobacco: Former    Types: Engineer, Drilling   Vaping status: Never Used  Substance and Sexual  Activity   Alcohol use: Yes    Comment: social   Drug use: No   Sexual activity: Not on file  Other Topics Concern   Not on file  Social History Narrative   Not on file   Social Drivers of Health   Financial Resource Strain: Not on file  Food Insecurity: No Food Insecurity (01/27/2023)   Hunger Vital Sign    Worried About Running Out of Food in the Last Year: Never true    Ran Out of Food in the Last Year: Never true  Transportation Needs: No Transportation Needs (01/27/2023)   PRAPARE - Administrator, Civil Service  (Medical): No    Lack of Transportation (Non-Medical): No  Physical Activity: Not on file  Stress: Not on file  Social Connections: Not on file  Intimate Partner Violence: Not At Risk (01/27/2023)   Humiliation, Afraid, Rape, and Kick questionnaire    Fear of Current or Ex-Partner: No    Emotionally Abused: No    Physically Abused: No    Sexually Abused: No      Review of Systems  Constitutional:  Negative for chills, fatigue and unexpected weight change.  HENT:  Negative for congestion, rhinorrhea, sneezing and sore throat.   Eyes:  Negative for redness.  Respiratory:  Negative for chest tightness, shortness of breath and wheezing.   Cardiovascular:  Negative for chest pain and palpitations.  Gastrointestinal:  Negative for abdominal pain, constipation, diarrhea, nausea and vomiting.  Genitourinary:  Negative for dysuria and frequency.  Musculoskeletal:  Negative for arthralgias, back pain, joint swelling and neck pain.  Skin:  Negative for rash.  Neurological: Negative.  Negative for tremors and numbness.  Hematological:  Negative for adenopathy. Does not bruise/bleed easily.  Psychiatric/Behavioral:  Negative for behavioral problems (Depression), sleep disturbance and suicidal ideas. The patient is not nervous/anxious.     Vital Signs: BP 128/76 Comment: home reading, 142/92 office  Pulse 89   Temp 98 F (36.7 C)   Resp 16   Ht 6' 2 (1.88 m)   Wt 271 lb (122.9 kg)   SpO2 97%   BMI 34.79 kg/m    Physical Exam Vitals and nursing note reviewed.  Constitutional:      General: He is not in acute distress.    Appearance: He is well-developed. He is not diaphoretic.  HENT:     Head: Normocephalic and atraumatic.  Eyes:     Pupils: Pupils are equal, round, and reactive to light.  Neck:     Thyroid : No thyromegaly.     Vascular: No JVD.     Trachea: No tracheal deviation.  Cardiovascular:     Rate and Rhythm: Normal rate and regular rhythm.     Heart sounds: Normal  heart sounds. No murmur heard.    No friction rub. No gallop.  Pulmonary:     Effort: Pulmonary effort is normal.     Breath sounds: No wheezing.  Musculoskeletal:        General: Normal range of motion.  Skin:    General: Skin is warm and dry.  Neurological:     Mental Status: He is alert and oriented to person, place, and time.  Psychiatric:        Behavior: Behavior normal.        Thought Content: Thought content normal.        Judgment: Judgment normal.        Assessment/Plan: 1. Type 2 diabetes mellitus with stage 3a  chronic kidney disease, without long-term current use of insulin  (HCC) (Primary) - POCT HgB A1C is is 5.8 which I sup from 5.4 last check, continue rybelsus . Will monitor renal function - Urine Microalbumin w/creat. ratio  2. 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 White coat syndrome with diagnosis of hypertension Elevated in office, but controlled at home and will continue to monitor   General Counseling: kean gautreau understanding of the findings of todays visit and agrees with plan of treatment. I have discussed any further diagnostic evaluation that may be needed or ordered today. We also reviewed his medications today. he has been encouraged to call the office with any questions or concerns that should arise related to todays visit.    Orders Placed This Encounter  Procedures   US  Renal   Urine Microalbumin w/creat. ratio   POCT HgB A1C    No orders of the defined types were placed in this encounter.   This patient was seen by Tinnie Pro, PA-C in collaboration with Dr. Sigrid Bathe as a part of collaborative care agreement.   Total time spent:30 Minutes Time spent includes review of chart, medications, test results, and follow up plan with the patient.      Dr Fozia M Khan Internal medicine

## 2024-04-04 NOTE — Telephone Encounter (Signed)
 Notified patient of renal U/S appointment date, arrival time, location & to arrive with full bladder-[name]

## 2024-04-06 ENCOUNTER — Other Ambulatory Visit: Payer: Self-pay | Admitting: Physician Assistant

## 2024-04-06 DIAGNOSIS — E119 Type 2 diabetes mellitus without complications: Secondary | ICD-10-CM

## 2024-04-09 ENCOUNTER — Ambulatory Visit
Admission: RE | Admit: 2024-04-09 | Discharge: 2024-04-09 | Disposition: A | Discharge status: Home / Self Care | Source: Ambulatory Visit | Attending: Physician Assistant | Admitting: Physician Assistant

## 2024-04-09 DIAGNOSIS — N2889 Other specified disorders of kidney and ureter: Secondary | ICD-10-CM | POA: Diagnosis not present

## 2024-04-12 NOTE — Progress Notes (Signed)
 MAJOUR FREI                                          MRN: [id number]   04/12/2024   The [facility] Quality Team Specialist reviewed this patient medical record for the purposes of chart review for care gap closure. The following were reviewed: chart review for care gap closure-controlling blood pressure.    [facility] Quality Team

## 2024-04-24 ENCOUNTER — Other Ambulatory Visit: Payer: Self-pay | Admitting: Physician Assistant

## 2024-04-24 ENCOUNTER — Ambulatory Visit

## 2024-04-24 DIAGNOSIS — E119 Type 2 diabetes mellitus without complications: Secondary | ICD-10-CM | POA: Diagnosis not present

## 2024-04-25 LAB — MICROALBUMIN / CREATININE URINE RATIO
Creatinine, Urine: 162.5 mg/dL
Microalb/Creat Ratio: 20 mg/g{creat} (ref 0–29)
Microalbumin, Urine: 31.9 ug/mL

## 2024-05-13 ENCOUNTER — Encounter: Payer: Self-pay | Admitting: Physician Assistant

## 2024-05-13 ENCOUNTER — Ambulatory Visit (INDEPENDENT_AMBULATORY_CARE_PROVIDER_SITE_OTHER): Admitting: Physician Assistant

## 2024-05-13 VITALS — BP 118/88 | HR 101 | Temp 98.0°F | Resp 16 | Ht 74.0 in | Wt 275.0 lb

## 2024-05-13 DIAGNOSIS — N1831 Chronic kidney disease, stage 3a: Secondary | ICD-10-CM

## 2024-05-13 DIAGNOSIS — I10 Essential (primary) hypertension: Secondary | ICD-10-CM

## 2024-05-13 DIAGNOSIS — N2889 Other specified disorders of kidney and ureter: Secondary | ICD-10-CM | POA: Diagnosis not present

## 2024-05-13 NOTE — Progress Notes (Signed)
 [facility] [address] [place], [place] [address]  Internal MEDICINE  Office Visit Note  Patient Name: [name]  [id number]  [id number]  Date of Service: 05/13/2024  Chief Complaint  Patient presents with   Follow-up    Review renal U/S   Hypertension    May need to adjust med dose, BP has been running.   Gastroesophageal Reflux    HPI Pt is here for routine follow up to review renal US  -enjoyed trip to [place] with grandkids recently and has family visiting soon for christmas -BP at home this morning 135/87, some morning 120/70s. Thought he may need adjustment since higher in office recently, but much better today -renal US  showed cystic mass did not change in size, but still has non-simple features that require further monitoring either with repeat US  or consider MRI with IV contrast. He would like to move forward with MRI since he has had two US  at this point and renal function still abnormal  -states he does ok with MRI and does not need anxiety meds. He had a MRI for his spine previously and did well -calcium a little elevated and will monitor after MRI as well. -microalbumin ratio normal  Current Medication: Outpatient Encounter Medications as of 05/13/2024  Medication Sig   ACCU-CHEK GUIDE test strip USE AS DIRECTED DAILY   amLODipine -benazepril  (LOTREL) 10-40 MG capsule TAKE 1 CAPSULE BY MOUTH EVERY DAY   buPROPion  (WELLBUTRIN  XL) 150 MG 24 hr tablet TAKE 1 TABLET BY MOUTH EVERY DAY   hydrALAZINE  (APRESOLINE ) 10 MG tablet TAKE 1 TABLET (10 MG TOTAL) BY MOUTH 3 (THREE) TIMES DAILY. AS NEEDED FOR HIGH BP.   omeprazole  (PRILOSEC  OTC) 20 MG tablet Take 20 mg by mouth daily as needed (heartburn).   Semaglutide  (RYBELSUS ) 7 MG TABS TAKE 1 TABLET (7 MG TOTAL) BY MOUTH DAILY   No facility-administered encounter medications on file as of 05/13/2024.    Surgical History: Past Surgical History:  Procedure Laterality Date   COLONOSCOPY WITH PROPOFOL  N/A  07/01/2021   Procedure: COLONOSCOPY WITH PROPOFOL ;  Surgeon: Jinny Carmine, MD;  Location: [facility] ENDOSCOPY;  Service: Endoscopy;  Laterality: N/A;   KNEE ARTHROPLASTY Left 12/08/2021   Procedure: COMPUTER ASSISTED TOTAL KNEE ARTHROPLASTY;  Surgeon: Mardee Lynwood SQUIBB, MD;  Location: [facility] ORS;  Service: Orthopedics;  Laterality: Left;   LUMBAR LAMINECTOMY/DECOMPRESSION MICRODISCECTOMY N/A 01/27/2023   Procedure: L2-5 POSTERIOR SPINAL DECOMPRESSION;  Surgeon: Clois Fret, MD;  Location: [facility] ORS;  Service: Neurosurgery;  Laterality: N/A;   PROSTATECTOMY  2007   REPLACEMENT TOTAL KNEE Right 02/06/2013   TONSILLECTOMY Bilateral     Medical History: Past Medical History:  Diagnosis Date   Cancer (HCC)    prostate and skin   Degenerative arthritis of left knee    Depression    Essential hypertension    GERD (gastroesophageal reflux disease)    Low testosterone    Obesity (BMI 30.0-34.9)    Obstructive sleep apnea on CPAP    Pre-diabetes     Family History: Family History  Problem Relation Age of Onset   ALS Mother    Cancer Father    Cancer Paternal Grandfather     Social History   Socioeconomic History   Marital status: Married    Spouse name: Candis   Number of children: 1   Years of education: Not on file   Highest education level: Not on file  Occupational History   Not on file  Tobacco Use   Smoking  status: Former    Types: Cigarettes   Smokeless tobacco: Former    Types: Associate Professor status: Never Used  Substance and Sexual Activity   Alcohol use: Yes    Comment: social   Drug use: No   Sexual activity: Not on file  Other Topics Concern   Not on file  Social History Narrative   Not on file   Social Drivers of Health   Financial Resource Strain: Not on file  Food Insecurity: No Food Insecurity (01/27/2023)   Hunger Vital Sign    Worried About Running Out of Food in the Last Year: Never true    Ran Out of Food in the Last Year: Never true   Transportation Needs: No Transportation Needs (01/27/2023)   PRAPARE - Administrator, Civil Service (Medical): No    Lack of Transportation (Non-Medical): No  Physical Activity: Not on file  Stress: Not on file  Social Connections: Not on file  Intimate Partner Violence: Not At Risk (01/27/2023)   Humiliation, Afraid, Rape, and Kick questionnaire    Fear of Current or Ex-Partner: No    Emotionally Abused: No    Physically Abused: No    Sexually Abused: No      Review of Systems  Constitutional:  Negative for chills, fatigue and unexpected weight change.  HENT:  Negative for congestion, rhinorrhea, sneezing and sore throat.   Eyes:  Negative for redness.  Respiratory:  Negative for chest tightness, shortness of breath and wheezing.   Cardiovascular:  Negative for chest pain and palpitations.  Gastrointestinal:  Negative for abdominal pain, constipation, diarrhea, nausea and vomiting.  Genitourinary:  Negative for dysuria and frequency.  Musculoskeletal:  Negative for arthralgias, back pain, joint swelling and neck pain.  Skin:  Negative for rash.  Neurological: Negative.  Negative for tremors and numbness.  Hematological:  Negative for adenopathy. Does not bruise/bleed easily.  Psychiatric/Behavioral:  Negative for behavioral problems (Depression), sleep disturbance and suicidal ideas. The patient is not nervous/anxious.     Vital Signs: BP 118/88   Pulse (!) 101   Temp 98 F (36.7 C)   Resp 16   Ht 6' 2 (1.88 m)   Wt 275 lb (124.7 kg)   SpO2 96%   BMI 35.31 kg/m    Physical Exam Vitals and nursing note reviewed.  Constitutional:      General: He is not in acute distress.    Appearance: He is well-developed. He is not diaphoretic.  HENT:     Head: Normocephalic and atraumatic.  Eyes:     Pupils: Pupils are equal, round, and reactive to light.  Neck:     Thyroid : No thyromegaly.     Vascular: No JVD.     Trachea: No tracheal deviation.   Cardiovascular:     Rate and Rhythm: Normal rate and regular rhythm.     Heart sounds: Normal heart sounds. No murmur heard.    No friction rub. No gallop.  Pulmonary:     Effort: Pulmonary effort is normal.     Breath sounds: No wheezing.  Musculoskeletal:        General: Normal range of motion.  Skin:    General: Skin is warm and dry.  Neurological:     Mental Status: He is alert and oriented to person, place, and time.  Psychiatric:        Behavior: Behavior normal.        Thought Content: Thought content  normal.        Judgment: Judgment normal.        Assessment/Plan: 1. Renal mass, right (Primary) Will go ahead with MRI given non-simple features of cystic mass and abnormal renal function recently. - MR ABDOMEN W CONTRAST; Future  2. Stage 3a chronic kidney disease (HCC) Will check MRI, but will also monitor labs - MR ABDOMEN W CONTRAST; Future  3. Essential hypertension Improved, continue current medications   General Counseling: brynden thune understanding of the findings of todays visit and agrees with plan of treatment. I have discussed any further diagnostic evaluation that may be needed or ordered today. We also reviewed his medications today. he has been encouraged to call the office with any questions or concerns that should arise related to todays visit.    Orders Placed This Encounter  Procedures   MR ABDOMEN W CONTRAST    No orders of the defined types were placed in this encounter.   This patient was seen by Tinnie Pro, PA-C in collaboration with Dr. Sigrid Bathe as a part of collaborative care agreement.   Total time spent:30 Minutes Time spent includes review of chart, medications, test results, and follow up plan with the patient.      Dr Fozia M Khan Internal medicine

## 2024-05-28 ENCOUNTER — Telehealth: Payer: Self-pay | Admitting: Physician Assistant

## 2024-05-28 NOTE — Telephone Encounter (Signed)
 Left w/ patient needing to confirm insurance coverage for next year-Toni

## 2024-06-10 ENCOUNTER — Telehealth: Payer: Self-pay | Admitting: Physician Assistant

## 2024-06-10 NOTE — Telephone Encounter (Signed)
 Lvm notifying patient of MRI appointment date, arrival time, location -[name]

## 2024-06-14 ENCOUNTER — Ambulatory Visit
Admission: RE | Admit: 2024-06-14 | Discharge: 2024-06-14 | Disposition: A | Source: Ambulatory Visit | Attending: Physician Assistant

## 2024-06-14 DIAGNOSIS — N2889 Other specified disorders of kidney and ureter: Secondary | ICD-10-CM | POA: Insufficient documentation

## 2024-06-14 DIAGNOSIS — N1831 Chronic kidney disease, stage 3a: Secondary | ICD-10-CM | POA: Insufficient documentation

## 2024-06-14 MED ORDER — GADOBUTROL 1 MMOL/ML IV SOLN
10.0000 mL | Freq: Once | INTRAVENOUS | Status: AC | PRN
  Administered 2024-06-14: 10 mL via INTRAVENOUS

## 2024-06-20 ENCOUNTER — Ambulatory Visit: Payer: Medicare Other | Admitting: Physician Assistant

## 2024-06-20 LAB — HM DIABETES EYE EXAM

## 2024-07-10 ENCOUNTER — Ambulatory Visit: Payer: Self-pay | Admitting: Physician Assistant

## 2024-07-12 ENCOUNTER — Ambulatory Visit: Admitting: Physician Assistant

## 2024-07-12 ENCOUNTER — Encounter: Payer: Self-pay | Admitting: Physician Assistant

## 2024-07-12 VITALS — BP 123/71 | HR 95 | Temp 98.0°F | Resp 16 | Ht 74.0 in | Wt 281.0 lb

## 2024-07-12 DIAGNOSIS — R5383 Other fatigue: Secondary | ICD-10-CM

## 2024-07-12 DIAGNOSIS — E538 Deficiency of other specified B group vitamins: Secondary | ICD-10-CM

## 2024-07-12 DIAGNOSIS — E782 Mixed hyperlipidemia: Secondary | ICD-10-CM

## 2024-07-12 DIAGNOSIS — I10 Essential (primary) hypertension: Secondary | ICD-10-CM

## 2024-07-12 DIAGNOSIS — E559 Vitamin D deficiency, unspecified: Secondary | ICD-10-CM

## 2024-07-12 DIAGNOSIS — Z125 Encounter for screening for malignant neoplasm of prostate: Secondary | ICD-10-CM

## 2024-07-12 DIAGNOSIS — Z1329 Encounter for screening for other suspected endocrine disorder: Secondary | ICD-10-CM

## 2024-07-12 DIAGNOSIS — Z23 Encounter for immunization: Secondary | ICD-10-CM

## 2024-07-12 DIAGNOSIS — E1122 Type 2 diabetes mellitus with diabetic chronic kidney disease: Secondary | ICD-10-CM

## 2024-07-12 DIAGNOSIS — F330 Major depressive disorder, recurrent, mild: Secondary | ICD-10-CM

## 2024-07-12 DIAGNOSIS — Z0001 Encounter for general adult medical examination with abnormal findings: Secondary | ICD-10-CM

## 2024-07-12 MED ORDER — PNEUMOCOCCAL 20-VAL CONJ VACC 0.5 ML IM SUSY: 0.5000 mL | PREFILLED_SYRINGE | INTRAMUSCULAR | 0 refills | Status: AC

## 2024-07-12 MED ORDER — HYDRALAZINE HCL 10 MG PO TABS: 10.0000 mg | ORAL_TABLET | Freq: Every day | ORAL | 1 refills | Status: DC

## 2024-07-12 MED ORDER — HYDRALAZINE HCL 10 MG PO TABS: 10.0000 mg | ORAL_TABLET | Freq: Every day | ORAL | 1 refills | Status: AC

## 2024-07-12 MED ORDER — BUPROPION HCL ER (XL) 300 MG PO TB24: 300.0000 mg | ORAL_TABLET | Freq: Every day | ORAL | 1 refills | Status: AC

## 2024-07-12 NOTE — Progress Notes (Cosign Needed)
 [facility] [address] [place], [place] [address]  Internal MEDICINE  Office Visit Note  Patient Name: [name]  [id number]  [id number]  Date of Service: 07/12/2024  Chief Complaint  Patient presents with   Medicare Wellness    Review abdominal MRI   Gastroesophageal Reflux   Hypertension   Medication Management    Maybe increase Wellbutrin  or see Psychiatry    HPI Paiden presents for an annual well visit Well-appearing 69 y.o. male Routine CRC screening: UTD Labs: ordered Other concerns: rybelsus  now more expensive and may taper off since A1c controlled and hasn't helped weight -Doesn't think wellbutrin  is working as well. Will have some arguments with wife, or times just wants to go to bed.States other days he feels well though and is happy with his work -lingering cough, getting better. Was worse about 3 weeks ago and could cough up phlegm, but now reduced -Bp 120/70s at home, only high in office MRI reviewed:IMPRESSION:  1. Simple, benign bilateral renal cortical cysts as well as tiny  parapelvic renal cysts. No further follow-up or characterization is  required for these benign Bosniak category I cysts.  2. Hepatic steatosis.      07/12/2024   10:45 AM 06/16/2023   11:23 AM  MMSE - Mini Mental State Exam  Orientation to time 5 5  Orientation to Place 5 5  Registration 3 3  Attention/ Calculation 5 5  Recall 3 3  Language- name 2 objects 2 2  Language- repeat 1 1  Language- follow 3 step command 3 3  Language- read & follow direction 1 1  Write a sentence 1 1  Copy design 1 1  Total score 30 30    Functional Status Survey: Is the patient deaf or have difficulty hearing?: No Does the patient have difficulty seeing, even when wearing glasses/contacts?: No Does the patient have difficulty concentrating, remembering, or making decisions?: No Does the patient have difficulty walking or climbing stairs?: No Does the patient have difficulty  dressing or bathing?: No Does the patient have difficulty doing errands alone such as visiting a doctor's office or shopping?: No     12/30/2022   11:35 AM 06/16/2023   11:22 AM 09/14/2023   11:50 AM 04/04/2024   10:31 AM 07/12/2024   10:43 AM  Fall Risk  Falls in the past year? 0 0 0 0 0       07/12/2024   10:43 AM  Depression screen PHQ 2/9  Decreased Interest 0  Down, Depressed, Hopeless 0  PHQ - 2 Score 0        No data to display            Current Medication: Outpatient Encounter Medications as of 07/12/2024  Medication Sig   ACCU-CHEK GUIDE test strip USE AS DIRECTED DAILY   amLODipine -benazepril  (LOTREL) 10-40 MG capsule TAKE 1 CAPSULE BY MOUTH EVERY DAY   buPROPion  (WELLBUTRIN  XL) 300 MG 24 hr tablet Take 1 tablet (300 mg total) by mouth daily.   omeprazole  (PRILOSEC  OTC) 20 MG tablet Take 20 mg by mouth daily as needed (heartburn).   Semaglutide  (RYBELSUS ) 7 MG TABS TAKE 1 TABLET (7 MG TOTAL) BY MOUTH DAILY   [DISCONTINUED] buPROPion  (WELLBUTRIN  XL) 150 MG 24 hr tablet TAKE 1 TABLET BY MOUTH EVERY DAY   [DISCONTINUED] hydrALAZINE  (APRESOLINE ) 10 MG tablet TAKE 1 TABLET (10 MG TOTAL) BY MOUTH 3 (THREE) TIMES DAILY. AS NEEDED FOR HIGH BP.   [DISCONTINUED] pneumococcal 20-valent conjugate  vaccine (PREVNAR 20) 0.5 ML injection Inject 0.5 mLs into the muscle tomorrow at 10 am.   hydrALAZINE  (APRESOLINE ) 10 MG tablet Take 1 tablet (10 mg total) by mouth daily. May take second tab as needed for high BP.   pneumococcal 20-valent conjugate vaccine (PREVNAR 20) 0.5 ML injection Inject 0.5 mLs into the muscle tomorrow at 10 am for 1 dose.   [DISCONTINUED] hydrALAZINE  (APRESOLINE ) 10 MG tablet Take 1 tablet (10 mg total) by mouth daily. As needed for high BP.   No facility-administered encounter medications on file as of 07/12/2024.    Surgical History: Past Surgical History:  Procedure Laterality Date   COLONOSCOPY WITH PROPOFOL  N/A 07/01/2021   Procedure: COLONOSCOPY WITH  PROPOFOL ;  Surgeon: Jinny Carmine, MD;  Location: [facility] ENDOSCOPY;  Service: Endoscopy;  Laterality: N/A;   KNEE ARTHROPLASTY Left 12/08/2021   Procedure: COMPUTER ASSISTED TOTAL KNEE ARTHROPLASTY;  Surgeon: Mardee Lynwood SQUIBB, MD;  Location: [facility] ORS;  Service: Orthopedics;  Laterality: Left;   LUMBAR LAMINECTOMY/DECOMPRESSION MICRODISCECTOMY N/A 01/27/2023   Procedure: L2-5 POSTERIOR SPINAL DECOMPRESSION;  Surgeon: Clois Fret, MD;  Location: [facility] ORS;  Service: Neurosurgery;  Laterality: N/A;   PROSTATECTOMY  2007   REPLACEMENT TOTAL KNEE Right 02/06/2013   TONSILLECTOMY Bilateral     Medical History: Past Medical History:  Diagnosis Date   Cancer (HCC)    prostate and skin   Degenerative arthritis of left knee    Depression    Essential hypertension    GERD (gastroesophageal reflux disease)    Low testosterone    Obesity (BMI 30.0-34.9)    Obstructive sleep apnea on CPAP    Pre-diabetes     Family History: Family History  Problem Relation Age of Onset   ALS Mother    Cancer Father    Cancer Paternal Grandfather     Social History   Socioeconomic History   Marital status: Married    Spouse name: Candis   Number of children: 1   Years of education: Not on file   Highest education level: Not on file  Occupational History   Not on file  Tobacco Use   Smoking status: Former    Types: Cigarettes   Smokeless tobacco: Former    Types: Engineer, Drilling   Vaping status: Never Used  Substance and Sexual Activity   Alcohol use: Yes    Comment: social   Drug use: No   Sexual activity: Not on file  Other Topics Concern   Not on file  Social History Narrative   Not on file   Social Drivers of Health   Tobacco Use: Medium Risk (07/12/2024)   Patient History    Smoking Tobacco Use: Former    Smokeless Tobacco Use: Former    Passive Exposure: Not on Actuary Strain: Not on file  Food Insecurity: No Food Insecurity (01/27/2023)   Hunger Vital Sign     Worried About Running Out of Food in the Last Year: Never true    Ran Out of Food in the Last Year: Never true  Transportation Needs: No Transportation Needs (01/27/2023)   PRAPARE - Administrator, Civil Service (Medical): No    Lack of Transportation (Non-Medical): No  Physical Activity: Not on file  Stress: Not on file  Social Connections: Not on file  Intimate Partner Violence: Not At Risk (01/27/2023)   Humiliation, Afraid, Rape, and Kick questionnaire    Fear of Current or Ex-Partner: No    Emotionally  Abused: No    Physically Abused: No    Sexually Abused: No  Depression (PHQ2-9): Low Risk (07/12/2024)   Depression (PHQ2-9)    PHQ-2 Score: 0  Alcohol Screen: Low Risk (07/12/2024)   Alcohol Screen    Last Alcohol Screening Score (AUDIT): 2  Housing: Low Risk (01/27/2023)   Housing    Last Housing Risk Score: 0  Utilities: Not At Risk (01/27/2023)   AHC Utilities    Threatened with loss of utilities: No  Health Literacy: Not on file      Review of Systems  Constitutional:  Negative for chills, fatigue and unexpected weight change.  HENT:  Positive for postnasal drip. Negative for congestion, rhinorrhea, sneezing and sore throat.   Eyes:  Negative for redness.  Respiratory:  Positive for cough. Negative for chest tightness, shortness of breath and wheezing.   Cardiovascular:  Negative for chest pain and palpitations.  Gastrointestinal:  Negative for abdominal pain, constipation, diarrhea, nausea and vomiting.  Genitourinary:  Negative for dysuria and frequency.  Musculoskeletal:  Negative for arthralgias, back pain, joint swelling and neck pain.  Skin:  Negative for rash.  Neurological: Negative.  Negative for tremors and numbness.  Hematological:  Negative for adenopathy. Does not bruise/bleed easily.  Psychiatric/Behavioral:  Positive for dysphoric mood. Negative for sleep disturbance and suicidal ideas. Behavioral problem: Depression.The patient is not  nervous/anxious.     Vital Signs: BP 123/71 Comment: home reading, 154/94 in office  Pulse 95   Temp 98 F (36.7 C)   Resp 16   Ht 6' 2 (1.88 m)   Wt 281 lb (127.5 kg)   SpO2 98%   BMI 36.08 kg/m    Physical Exam Vitals and nursing note reviewed.  Constitutional:      General: He is not in acute distress.    Appearance: He is well-developed. He is not diaphoretic.  HENT:     Head: Normocephalic and atraumatic.  Eyes:     Extraocular Movements: Extraocular movements intact.  Neck:     Thyroid : No thyromegaly.     Vascular: No JVD.     Trachea: No tracheal deviation.  Cardiovascular:     Rate and Rhythm: Normal rate and regular rhythm.     Heart sounds: Normal heart sounds. No murmur heard.    No friction rub. No gallop.  Pulmonary:     Effort: Pulmonary effort is normal.     Breath sounds: No wheezing.  Skin:    General: Skin is warm and dry.  Neurological:     Mental Status: He is alert and oriented to person, place, and time.  Psychiatric:        Behavior: Behavior normal.        Thought Content: Thought content normal.        Judgment: Judgment normal.        Assessment/Plan: 1. Encounter for Medicare annual examination with abnormal findings (Primary) AWV performed, labs ordered, due to PNA vaccine  2. White coat syndrome with diagnosis of hypertension Well controlled at home, but elevated in office. Continue current medications and monitoring. May take extra tab hydralazine  prn - hydrALAZINE  (APRESOLINE ) 10 MG tablet; Take 1 tablet (10 mg total) by mouth daily. May take second tab as needed for high BP.  Dispense: 90 tablet; Refill: 1  3. Type 2 diabetes mellitus with stage 3a chronic kidney disease, without long-term current use of insulin  (HCC) Has been well controlled, will taper off rybelsus  due to high cost and monitor  labs. Renal masses shown to be benign cysts without further follow up needed. Will monitor renal function - Comprehensive  metabolic panel with GFR - Hgb J8R w/o eAG  4. Mild episode of recurrent major depressive disorder Will increase to 300mg  wellbutrin  - buPROPion  (WELLBUTRIN  XL) 300 MG 24 hr tablet; Take 1 tablet (300 mg total) by mouth daily.  Dispense: 90 tablet; Refill: 1  5. Need for pneumococcal vaccination - pneumococcal 20-valent conjugate vaccine (PREVNAR 20) 0.5 ML injection; Inject 0.5 mLs into the muscle tomorrow at 10 am for 1 dose.  Dispense: 0.5 mL; Refill: 0  6. Vitamin D  deficiency - VITAMIN D  25 Hydroxy (Vit-D Deficiency, Fractures)  7. Mixed hyperlipidemia - Lipid Panel With LDL/HDL Ratio  8. Special screening for malignant neoplasm of prostate - PSA Total (Reflex To Free)  9. Thyroid  disorder screen - TSH + free T4  10. B12 deficiency - B12 and Folate Panel  11. Other fatigue - CBC w/Diff/Platelet - Comprehensive metabolic panel with GFR - TSH + free T4 - Lipid Panel With LDL/HDL Ratio - PSA Total (Reflex To Free) - VITAMIN D  25 Hydroxy (Vit-D Deficiency, Fractures) - Hgb A1C w/o eAG - B12 and Folate Panel     General Counseling: Khamauri verbalizes understanding of the findings of todays visit and agrees with plan of treatment. I have discussed any further diagnostic evaluation that may be needed or ordered today. We also reviewed his medications today. he has been encouraged to call the office with any questions or concerns that should arise related to todays visit.    Orders Placed This Encounter  Procedures   CBC w/Diff/Platelet   Comprehensive metabolic panel with GFR   TSH + free T4   Lipid Panel With LDL/HDL Ratio   PSA Total (Reflex To Free)   VITAMIN D  25 Hydroxy (Vit-D Deficiency, Fractures)   Hgb A1C w/o eAG   B12 and Folate Panel    Meds ordered this encounter  Medications   DISCONTD: hydrALAZINE  (APRESOLINE ) 10 MG tablet    Sig: Take 1 tablet (10 mg total) by mouth daily. As needed for high BP.    Dispense:  90 tablet    Refill:  1   buPROPion   (WELLBUTRIN  XL) 300 MG 24 hr tablet    Sig: Take 1 tablet (300 mg total) by mouth daily.    Dispense:  90 tablet    Refill:  1   pneumococcal 20-valent conjugate vaccine (PREVNAR 20) 0.5 ML injection    Sig: Inject 0.5 mLs into the muscle tomorrow at 10 am for 1 dose.    Dispense:  0.5 mL    Refill:  0   hydrALAZINE  (APRESOLINE ) 10 MG tablet    Sig: Take 1 tablet (10 mg total) by mouth daily. May take second tab as needed for high BP.    Dispense:  90 tablet    Refill:  1    Return in about 4 months (around 11/09/2024) for lab review, HTN.   Total time spent: 40 Minutes Time spent includes review of chart, medications, test results, and follow up plan with the patient.   [place] Controlled Substance Database was reviewed by me.  This patient was seen by Tinnie Pro, PA-C in collaboration with Dr. Sigrid Bathe as a part of collaborative care agreement.  Tinnie Pro, PA-C Internal medicine

## 2024-11-07 ENCOUNTER — Ambulatory Visit: Admitting: Physician Assistant

## 2025-07-14 ENCOUNTER — Ambulatory Visit: Admitting: Physician Assistant
